# Patient Record
Sex: Female | Born: 1955 | Race: White | Hispanic: No | Marital: Married | Smoking: Current every day smoker
Health system: Southern US, Community
[De-identification: ages and names within clinical notes are randomized; demographics above are authoritative.]

## PROBLEM LIST (undated history)

## (undated) DIAGNOSIS — F3289 Other specified depressive episodes: Secondary | ICD-10-CM

## (undated) DIAGNOSIS — Z803 Family history of malignant neoplasm of breast: Secondary | ICD-10-CM

## (undated) DIAGNOSIS — M1990 Unspecified osteoarthritis, unspecified site: Secondary | ICD-10-CM

## (undated) DIAGNOSIS — J309 Allergic rhinitis, unspecified: Secondary | ICD-10-CM

## (undated) DIAGNOSIS — Z860100 Personal history of colon polyps, unspecified: Secondary | ICD-10-CM

## (undated) DIAGNOSIS — F329 Major depressive disorder, single episode, unspecified: Secondary | ICD-10-CM

## (undated) DIAGNOSIS — E785 Hyperlipidemia, unspecified: Secondary | ICD-10-CM

## (undated) DIAGNOSIS — G4733 Obstructive sleep apnea (adult) (pediatric): Secondary | ICD-10-CM

## (undated) DIAGNOSIS — F17200 Nicotine dependence, unspecified, uncomplicated: Secondary | ICD-10-CM

## (undated) DIAGNOSIS — D259 Leiomyoma of uterus, unspecified: Secondary | ICD-10-CM

## (undated) DIAGNOSIS — E119 Type 2 diabetes mellitus without complications: Secondary | ICD-10-CM

## (undated) DIAGNOSIS — E559 Vitamin D deficiency, unspecified: Secondary | ICD-10-CM

## (undated) DIAGNOSIS — M8580 Other specified disorders of bone density and structure, unspecified site: Secondary | ICD-10-CM

## (undated) HISTORY — DX: Allergic rhinitis, unspecified: J30.9

## (undated) HISTORY — DX: Hyperlipidemia, unspecified: E78.5

## (undated) HISTORY — DX: Family history of malignant neoplasm of breast: Z80.3

## (undated) HISTORY — DX: Obstructive sleep apnea (adult) (pediatric): G47.33

## (undated) HISTORY — DX: Vitamin D deficiency, unspecified: E55.9

## (undated) HISTORY — DX: Other specified depressive episodes: F32.89

## (undated) HISTORY — DX: Type 2 diabetes mellitus without complications: E11.9

## (undated) HISTORY — DX: Major depressive disorder, single episode, unspecified: F32.9

## (undated) HISTORY — DX: Personal history of colonic polyps: Z86.010

## (undated) HISTORY — DX: Leiomyoma of uterus, unspecified: D25.9

## (undated) HISTORY — DX: Personal history of colon polyps, unspecified: Z86.0100

## (undated) HISTORY — DX: Other specified disorders of bone density and structure, unspecified site: M85.80

## (undated) HISTORY — DX: Nicotine dependence, unspecified, uncomplicated: F17.200

---

## 1988-10-06 HISTORY — PX: BREAST SURGERY: SHX581

## 1999-09-11 ENCOUNTER — Ambulatory Visit (HOSPITAL_COMMUNITY): Admission: RE | Admit: 1999-09-11 | Discharge: 1999-09-11 | Payer: Self-pay | Admitting: Endocrinology

## 1999-09-11 ENCOUNTER — Encounter: Payer: Self-pay | Admitting: Endocrinology

## 1999-09-13 ENCOUNTER — Ambulatory Visit (HOSPITAL_COMMUNITY): Admission: RE | Admit: 1999-09-13 | Discharge: 1999-09-13 | Payer: Self-pay | Admitting: Endocrinology

## 1999-09-13 ENCOUNTER — Encounter: Payer: Self-pay | Admitting: Endocrinology

## 2000-04-07 ENCOUNTER — Other Ambulatory Visit: Admission: RE | Admit: 2000-04-07 | Discharge: 2000-04-07 | Payer: Self-pay | Admitting: Obstetrics and Gynecology

## 2000-04-15 ENCOUNTER — Encounter: Admission: RE | Admit: 2000-04-15 | Discharge: 2000-04-15 | Payer: Self-pay | Admitting: Obstetrics and Gynecology

## 2000-04-15 ENCOUNTER — Encounter: Payer: Self-pay | Admitting: Obstetrics and Gynecology

## 2000-09-17 ENCOUNTER — Encounter: Payer: Self-pay | Admitting: Obstetrics and Gynecology

## 2000-09-17 ENCOUNTER — Encounter: Admission: RE | Admit: 2000-09-17 | Discharge: 2000-09-17 | Payer: Self-pay | Admitting: Obstetrics and Gynecology

## 2001-04-20 ENCOUNTER — Encounter: Payer: Self-pay | Admitting: Obstetrics and Gynecology

## 2001-04-20 ENCOUNTER — Encounter: Admission: RE | Admit: 2001-04-20 | Discharge: 2001-04-20 | Payer: Self-pay | Admitting: Obstetrics and Gynecology

## 2001-09-21 ENCOUNTER — Encounter: Admission: RE | Admit: 2001-09-21 | Discharge: 2001-09-21 | Payer: Self-pay | Admitting: Obstetrics and Gynecology

## 2001-09-21 ENCOUNTER — Encounter: Payer: Self-pay | Admitting: Obstetrics and Gynecology

## 2001-10-06 DIAGNOSIS — E119 Type 2 diabetes mellitus without complications: Secondary | ICD-10-CM

## 2001-10-06 HISTORY — DX: Type 2 diabetes mellitus without complications: E11.9

## 2002-03-16 ENCOUNTER — Encounter: Payer: Self-pay | Admitting: Internal Medicine

## 2002-03-16 ENCOUNTER — Other Ambulatory Visit: Admission: RE | Admit: 2002-03-16 | Discharge: 2002-03-16 | Payer: Self-pay | Admitting: Obstetrics and Gynecology

## 2002-03-16 LAB — CONVERTED CEMR LAB: Hgb A1c MFr Bld: 5.5 %

## 2002-04-26 ENCOUNTER — Encounter: Payer: Self-pay | Admitting: Obstetrics and Gynecology

## 2002-04-26 ENCOUNTER — Encounter: Admission: RE | Admit: 2002-04-26 | Discharge: 2002-04-26 | Payer: Self-pay | Admitting: Obstetrics and Gynecology

## 2002-11-22 ENCOUNTER — Encounter: Admission: RE | Admit: 2002-11-22 | Discharge: 2002-11-22 | Payer: Self-pay | Admitting: Obstetrics and Gynecology

## 2002-11-22 ENCOUNTER — Encounter: Payer: Self-pay | Admitting: Obstetrics and Gynecology

## 2003-06-14 ENCOUNTER — Other Ambulatory Visit: Admission: RE | Admit: 2003-06-14 | Discharge: 2003-06-14 | Payer: Self-pay | Admitting: Obstetrics and Gynecology

## 2003-06-15 ENCOUNTER — Encounter: Payer: Self-pay | Admitting: Obstetrics and Gynecology

## 2003-06-15 ENCOUNTER — Encounter: Admission: RE | Admit: 2003-06-15 | Discharge: 2003-06-15 | Payer: Self-pay | Admitting: Obstetrics and Gynecology

## 2003-08-21 ENCOUNTER — Ambulatory Visit (HOSPITAL_COMMUNITY): Admission: RE | Admit: 2003-08-21 | Discharge: 2003-08-21 | Payer: Self-pay | Admitting: Obstetrics and Gynecology

## 2003-08-21 ENCOUNTER — Ambulatory Visit (HOSPITAL_BASED_OUTPATIENT_CLINIC_OR_DEPARTMENT_OTHER): Admission: RE | Admit: 2003-08-21 | Discharge: 2003-08-21 | Payer: Self-pay | Admitting: Obstetrics and Gynecology

## 2003-08-21 HISTORY — PX: ENDOMETRIAL ABLATION: SHX621

## 2003-12-11 ENCOUNTER — Encounter: Admission: RE | Admit: 2003-12-11 | Discharge: 2003-12-11 | Payer: Self-pay | Admitting: Obstetrics and Gynecology

## 2004-07-10 ENCOUNTER — Encounter: Admission: RE | Admit: 2004-07-10 | Discharge: 2004-07-10 | Payer: Self-pay | Admitting: Obstetrics and Gynecology

## 2005-03-21 ENCOUNTER — Other Ambulatory Visit: Admission: RE | Admit: 2005-03-21 | Discharge: 2005-03-21 | Payer: Self-pay | Admitting: Obstetrics and Gynecology

## 2005-06-26 ENCOUNTER — Encounter: Admission: RE | Admit: 2005-06-26 | Discharge: 2005-06-26 | Payer: Self-pay | Admitting: Obstetrics and Gynecology

## 2005-07-16 ENCOUNTER — Encounter: Admission: RE | Admit: 2005-07-16 | Discharge: 2005-07-16 | Payer: Self-pay | Admitting: Obstetrics and Gynecology

## 2005-08-27 ENCOUNTER — Encounter: Admission: RE | Admit: 2005-08-27 | Discharge: 2005-08-27 | Payer: Self-pay | Admitting: Obstetrics and Gynecology

## 2006-04-28 ENCOUNTER — Other Ambulatory Visit: Admission: RE | Admit: 2006-04-28 | Discharge: 2006-04-28 | Payer: Self-pay | Admitting: Obstetrics and Gynecology

## 2006-06-06 HISTORY — PX: COLONOSCOPY W/ BIOPSIES: SHX1374

## 2006-06-15 ENCOUNTER — Ambulatory Visit (HOSPITAL_COMMUNITY): Admission: RE | Admit: 2006-06-15 | Discharge: 2006-06-15 | Payer: Self-pay | Admitting: Gastroenterology

## 2006-06-15 ENCOUNTER — Encounter (INDEPENDENT_AMBULATORY_CARE_PROVIDER_SITE_OTHER): Payer: Self-pay | Admitting: Specialist

## 2006-06-15 HISTORY — PX: UPPER GASTROINTESTINAL ENDOSCOPY: SHX188

## 2006-06-16 ENCOUNTER — Encounter: Admission: RE | Admit: 2006-06-16 | Discharge: 2006-06-16 | Payer: Self-pay | Admitting: Gastroenterology

## 2006-06-16 ENCOUNTER — Encounter: Payer: Self-pay | Admitting: Internal Medicine

## 2006-07-06 LAB — HM COLONOSCOPY

## 2006-07-22 ENCOUNTER — Encounter: Admission: RE | Admit: 2006-07-22 | Discharge: 2006-07-22 | Payer: Self-pay | Admitting: Obstetrics and Gynecology

## 2006-07-30 ENCOUNTER — Encounter (INDEPENDENT_AMBULATORY_CARE_PROVIDER_SITE_OTHER): Payer: Self-pay | Admitting: Specialist

## 2006-07-30 ENCOUNTER — Inpatient Hospital Stay (HOSPITAL_COMMUNITY): Admission: RE | Admit: 2006-07-30 | Discharge: 2006-08-02 | Payer: Self-pay | Admitting: General Surgery

## 2006-07-30 DIAGNOSIS — Z860100 Personal history of colon polyps, unspecified: Secondary | ICD-10-CM | POA: Insufficient documentation

## 2006-07-30 HISTORY — PX: RIGHT COLECTOMY: SHX853

## 2006-08-11 ENCOUNTER — Encounter: Admission: RE | Admit: 2006-08-11 | Discharge: 2006-08-11 | Payer: Self-pay | Admitting: Obstetrics and Gynecology

## 2006-10-09 ENCOUNTER — Encounter: Payer: Self-pay | Admitting: Internal Medicine

## 2006-10-14 ENCOUNTER — Encounter: Payer: Self-pay | Admitting: Internal Medicine

## 2006-10-14 ENCOUNTER — Encounter: Admission: RE | Admit: 2006-10-14 | Discharge: 2006-10-14 | Payer: Self-pay | Admitting: Family Medicine

## 2007-06-08 ENCOUNTER — Other Ambulatory Visit: Admission: RE | Admit: 2007-06-08 | Discharge: 2007-06-08 | Payer: Self-pay | Admitting: Obstetrics and Gynecology

## 2007-08-23 ENCOUNTER — Encounter: Payer: Self-pay | Admitting: Internal Medicine

## 2007-08-23 LAB — CONVERTED CEMR LAB
Cholesterol: 154 mg/dL
HDL: 44 mg/dL
LDL Cholesterol: 86 mg/dL
Triglyceride fasting, serum: 121 mg/dL

## 2008-03-22 ENCOUNTER — Encounter: Admission: RE | Admit: 2008-03-22 | Discharge: 2008-03-22 | Payer: Self-pay | Admitting: Family Medicine

## 2008-03-22 ENCOUNTER — Encounter: Payer: Self-pay | Admitting: Internal Medicine

## 2008-03-29 ENCOUNTER — Encounter: Payer: Self-pay | Admitting: Internal Medicine

## 2008-03-29 ENCOUNTER — Encounter: Admission: RE | Admit: 2008-03-29 | Discharge: 2008-03-29 | Payer: Self-pay | Admitting: Family Medicine

## 2008-07-25 ENCOUNTER — Other Ambulatory Visit: Admission: RE | Admit: 2008-07-25 | Discharge: 2008-07-25 | Payer: Self-pay | Admitting: Obstetrics and Gynecology

## 2008-07-25 ENCOUNTER — Encounter: Payer: Self-pay | Admitting: Internal Medicine

## 2008-07-25 LAB — CONVERTED CEMR LAB: TSH: 1.073 microintl units/mL

## 2008-08-03 ENCOUNTER — Encounter: Payer: Self-pay | Admitting: Internal Medicine

## 2008-08-03 LAB — CONVERTED CEMR LAB
ALT: 31 units/L
AST: 24 units/L
Alkaline Phosphatase: 64 units/L
Total Bilirubin: 0.5 mg/dL
Total Protein: 7.7 g/dL

## 2008-09-15 ENCOUNTER — Encounter: Payer: Self-pay | Admitting: Internal Medicine

## 2008-09-15 LAB — CONVERTED CEMR LAB
ALT: 32 units/L
AST: 30 units/L
Albumin: 4.7 g/dL
Alkaline Phosphatase: 82 units/L
BUN: 13 mg/dL
CO2: 24 meq/L
Chloride: 104 meq/L
Cholesterol: 164 mg/dL
Creatinine, Ser: 0.75 mg/dL
Glucose, Bld: 96 mg/dL
HDL: 48 mg/dL
LDL Cholesterol: 90 mg/dL
Potassium: 4.4 meq/L
Sodium: 139 meq/L
Total Bilirubin: 0.3 mg/dL
Total Protein: 7.7 g/dL
Triglyceride fasting, serum: 130 mg/dL

## 2008-10-06 LAB — CONVERTED CEMR LAB: Pap Smear: NEGATIVE

## 2008-11-01 ENCOUNTER — Encounter: Admission: RE | Admit: 2008-11-01 | Discharge: 2008-11-01 | Payer: Self-pay | Admitting: Obstetrics and Gynecology

## 2008-11-13 HISTORY — PX: COLONOSCOPY W/ BIOPSIES: SHX1374

## 2009-04-24 ENCOUNTER — Encounter: Payer: Self-pay | Admitting: Internal Medicine

## 2009-04-24 LAB — CONVERTED CEMR LAB
ALT: 27 units/L
AST: 24 units/L
Alkaline Phosphatase: 70 units/L
BUN: 17 mg/dL
Basophils Relative: 0 %
CO2: 23 meq/L
Calcium: 9.3 mg/dL
Chloride: 106 meq/L
Cholesterol: 160 mg/dL
Creatinine, Ser: 0.76 mg/dL
Eosinophils Relative: 2 %
Glucose, Bld: 96 mg/dL
HCT: 42.6 %
HDL: 47 mg/dL
Hemoglobin: 14.2 g/dL
LDL Cholesterol: 90 mg/dL
Lymphocytes, automated: 36 %
MCV: 95 fL
Monocytes Relative: 9 %
Neutrophils Relative %: 53 %
Platelets: 258 10*3/uL
Potassium: 4.4 meq/L
RBC: 4.48 M/uL
RDW: 12.5 %
Sodium: 145 meq/L
Total Bilirubin: 0.4 mg/dL
Total Protein: 7.3 g/dL
Triglyceride fasting, serum: 113 mg/dL
WBC: 6.3 10*3/uL

## 2009-05-22 ENCOUNTER — Encounter: Admission: RE | Admit: 2009-05-22 | Discharge: 2009-05-22 | Payer: Self-pay | Admitting: Obstetrics and Gynecology

## 2010-01-21 ENCOUNTER — Ambulatory Visit: Payer: Self-pay | Admitting: Internal Medicine

## 2010-01-21 DIAGNOSIS — J309 Allergic rhinitis, unspecified: Secondary | ICD-10-CM | POA: Insufficient documentation

## 2010-01-21 DIAGNOSIS — E785 Hyperlipidemia, unspecified: Secondary | ICD-10-CM | POA: Insufficient documentation

## 2010-01-21 DIAGNOSIS — F32A Depression, unspecified: Secondary | ICD-10-CM | POA: Insufficient documentation

## 2010-01-21 DIAGNOSIS — F17200 Nicotine dependence, unspecified, uncomplicated: Secondary | ICD-10-CM

## 2010-01-21 DIAGNOSIS — F17210 Nicotine dependence, cigarettes, uncomplicated: Secondary | ICD-10-CM | POA: Insufficient documentation

## 2010-01-21 DIAGNOSIS — F329 Major depressive disorder, single episode, unspecified: Secondary | ICD-10-CM

## 2010-01-21 DIAGNOSIS — Z720 Tobacco use: Secondary | ICD-10-CM | POA: Insufficient documentation

## 2010-01-21 DIAGNOSIS — N951 Menopausal and female climacteric states: Secondary | ICD-10-CM | POA: Insufficient documentation

## 2010-01-21 DIAGNOSIS — R635 Abnormal weight gain: Secondary | ICD-10-CM | POA: Insufficient documentation

## 2010-01-28 ENCOUNTER — Ambulatory Visit: Payer: Self-pay | Admitting: Internal Medicine

## 2010-01-28 DIAGNOSIS — J329 Chronic sinusitis, unspecified: Secondary | ICD-10-CM | POA: Insufficient documentation

## 2010-01-29 ENCOUNTER — Encounter: Payer: Self-pay | Admitting: Internal Medicine

## 2010-01-29 DIAGNOSIS — E559 Vitamin D deficiency, unspecified: Secondary | ICD-10-CM | POA: Insufficient documentation

## 2010-02-18 ENCOUNTER — Telehealth: Payer: Self-pay | Admitting: Internal Medicine

## 2010-02-20 ENCOUNTER — Encounter: Payer: Self-pay | Admitting: Internal Medicine

## 2010-03-28 ENCOUNTER — Ambulatory Visit: Payer: Self-pay | Admitting: Internal Medicine

## 2010-03-28 LAB — CONVERTED CEMR LAB
ALT: 29 units/L (ref 0–35)
AST: 25 units/L (ref 0–37)
Albumin: 4.2 g/dL (ref 3.5–5.2)
Alkaline Phosphatase: 55 units/L (ref 39–117)
BUN: 15 mg/dL (ref 6–23)
Basophils Absolute: 0 10*3/uL (ref 0.0–0.1)
Basophils Relative: 0.4 % (ref 0.0–3.0)
Bilirubin Urine: NEGATIVE
Bilirubin, Direct: 0.1 mg/dL (ref 0.0–0.3)
CO2: 27 meq/L (ref 19–32)
Calcium: 9 mg/dL (ref 8.4–10.5)
Chloride: 109 meq/L (ref 96–112)
Cholesterol: 162 mg/dL (ref 0–200)
Creatinine, Ser: 0.5 mg/dL (ref 0.4–1.2)
Eosinophils Absolute: 0.1 10*3/uL (ref 0.0–0.7)
Eosinophils Relative: 1.8 % (ref 0.0–5.0)
GFR calc non Af Amer: 130.05 mL/min (ref >60)
Glucose, Bld: 105 mg/dL — ABNORMAL HIGH (ref 70–99)
HCT: 40 % (ref 36.0–46.0)
HDL: 44.4 mg/dL (ref >39.00)
Hemoglobin: 13.9 g/dL (ref 12.0–15.0)
Ketones, ur: NEGATIVE mg/dL
LDL Cholesterol: 101 mg/dL — ABNORMAL HIGH (ref 0–99)
Leukocytes, UA: NEGATIVE
Lymphocytes Relative: 36.4 % (ref 12.0–46.0)
Lymphs Abs: 2.4 10*3/uL (ref 0.7–4.0)
MCHC: 34.8 g/dL (ref 30.0–36.0)
MCV: 94.8 fL (ref 78.0–100.0)
Monocytes Absolute: 0.7 10*3/uL (ref 0.1–1.0)
Monocytes Relative: 10.3 % (ref 3.0–12.0)
Neutro Abs: 3.4 10*3/uL (ref 1.4–7.7)
Neutrophils Relative %: 51.1 % (ref 43.0–77.0)
Nitrite: NEGATIVE
Platelets: 220 10*3/uL (ref 150.0–400.0)
Potassium: 4.3 meq/L (ref 3.5–5.1)
RBC: 4.23 M/uL (ref 3.87–5.11)
RDW: 12.2 % (ref 11.5–14.6)
Sodium: 142 meq/L (ref 135–145)
Specific Gravity, Urine: >=1.03 (ref 1.000–1.030)
TSH: 1 microintl units/mL (ref 0.35–5.50)
Total Bilirubin: 0.6 mg/dL (ref 0.3–1.2)
Total CHOL/HDL Ratio: 4
Total Protein, Urine: NEGATIVE mg/dL
Total Protein: 7.3 g/dL (ref 6.0–8.3)
Triglycerides: 83 mg/dL (ref 0.0–149.0)
Urine Glucose: NEGATIVE mg/dL
Urobilinogen, UA: 0.2 (ref 0.0–1.0)
VLDL: 16.6 mg/dL (ref 0.0–40.0)
WBC: 6.7 10*3/uL (ref 4.5–10.5)
pH: 5 (ref 5.0–8.0)

## 2010-04-02 ENCOUNTER — Ambulatory Visit: Payer: Self-pay | Admitting: Internal Medicine

## 2010-08-07 ENCOUNTER — Telehealth: Payer: Self-pay | Admitting: Internal Medicine

## 2010-09-13 ENCOUNTER — Encounter
Admission: RE | Admit: 2010-09-13 | Discharge: 2010-09-13 | Payer: Self-pay | Source: Home / Self Care | Attending: Obstetrics and Gynecology | Admitting: Obstetrics and Gynecology

## 2010-09-13 LAB — HM MAMMOGRAPHY: HM Mammogram: NEGATIVE

## 2010-10-27 ENCOUNTER — Encounter: Payer: Self-pay | Admitting: Obstetrics and Gynecology

## 2010-10-27 ENCOUNTER — Encounter: Payer: Self-pay | Admitting: Gastroenterology

## 2010-11-05 NOTE — Assessment & Plan Note (Signed)
Summary: new / bcbs / # / cd   Vital Signs:  Patient profile:   55 year old female Height:      63 inches (160.02 cm) Weight:      148.6 pounds (67.55 kg) BMI:     26.42 O2 Sat:      95 % on Room air Temp:     98.4 degrees F (36.89 degrees C) oral Pulse rate:   70 / minute BP sitting:   112 / 72  (left arm) Cuff size:   regular  Vitals Entered By: Orlan Leavens (January 21, 2010 2:17 PM)  O2 Flow:  Room air CC: New patient Is Patient Diabetic? No Pain Assessment Patient in pain? no        Primary Care Provider:  Newt Lukes MD  CC:  New patient.  History of Present Illness: new pt to me, here to est care - prev followed at [facility] - dr. stone  1) dyslipidemia - on statin tx for years w/o adv SE but ran out of meds 1.5 week ago- denies GI or Mskel probs on meds - due for FLP and lab check but not fasting now  2) depression - stress induced - on lexapro for years with good tol of med - no adv Se and symptoms well controlled -  3) tobacco abuse - has desire to quit but unable to do so - smoking habit a/w "winddown" relaxation at end of the day - denies SOB or cough - no pleurisy or CP  4) weight gain - inc >25# last 2 years - working on diet changes - tyrin to work in dedicated exercise time  [employer]  Alcohol-Tobacco     Alcohol drinks/day: <1     Alcohol Counseling: not indicated; use of alcohol is not excessive or problematic     Smoking Status: current     Tobacco Counseling: to quit use of tobacco products  Caffeine-Diet-Exercise     Diet Counseling: not indicated; diet is assessed to be healthy     Does Patient Exercise: no     Exercise Counseling: to improve exercise regimen     Depression Counseling: not indicated; screening negative for depression  Safety-Violence-Falls     Seat Belt Use: yes     Helmet Use: yes     Firearms in the Home: no firearms in the home     Smoke Detectors: yes     Violence in the  Home: no risk noted     Fall Risk Counseling: not indicated; no significant falls noted  Clinical Review Panels:  Prevention   Last Mammogram:  [facility].  Impression; No evidence of malignancy is identified in either breast. A stable asymmetry in the medial left breast has previously been shown to be a cluster of cyst on u/s.   BI-RADS 2, Benign findings (05/22/2009)   Last Pap Smear:  Interpretation/Result:Negative for intraepithelial Lesion or Malignancy.    (10/06/2008)   Last Colonoscopy:  Done by Dr. Loreta Ave Results: Showed some lesion of right colon, underwent colectomy (07/06/2006)  Immunizations   Last Tetanus Booster:  Historical (10/06/2002)   Current Medications (verified): 1)  Zyrtec Hives Relief 10 Mg Tabs (Cetirizine Hcl) .... Take 1 By Mouth Qd 2)  Vitamin D3 1000 Unit Caps (Cholecalciferol) .... Take 2 Po Qd 3)  Lexapro 10 Mg Tabs (Escitalopram Oxalate) .... Take 1 By Mouth Qd 4)  Vagifem 10 Mcg Tabs (Estradiol) .... Take 1  Weekly 5)  Caltrate 600+d 600-400 Mg-Unit Tabs (Calcium Carbonate-Vitamin D) .... Take 1 By Mouth Qd 6)  Aspirin 81 Mg Tbec (Aspirin) .... Once Daily 7)  Simvastatin 20 Mg Tabs (Simvastatin) .... Take 1 By Mouth Qd  Allergies (verified): No Known Drug Allergies  Past History:  Past Medical History: Allergic rhinitis Colonic polyps, hx of Hyperlipidemia  MD rooster: GI -[place] gyn-romine  Past Surgical History: Colectomy, Right (2008) Breast biopsy (1992)  Family History: Family History Breast cancer 1st degree relative <50 (mom, sister) Family History Diabetes 1st degree relative (mom) Family History High cholesterol (parent) Family History Hypertension (parent0 Stroke (momO Heart disease (dad)  Social History: Current Smoker married, lives with spouse - - cares for elderly parents and in-law    Smoking Status:  current Does Patient Exercise:  no Seat Belt Use:  yes  Review of  Systems       see HPI above. I have reviewed all other systems and they were negative.   Physical Exam  General:  alert, well-developed, well-nourished, and cooperative to examination.    Eyes:  vision grossly intact; pupils equal, round and reactive to light.  conjunctiva and lids normal.    Ears:  normal pinnae bilaterally, without erythema, swelling, or tenderness to palpation. TMs clear, without effusion, or cerumen impaction. Hearing grossly normal bilaterally  Mouth:  teeth and gums in good repair; mucous membranes moist, without lesions or ulcers. oropharynx clear without exudate, no erythema.  Neck:  supple, full ROM, no masses, no thyromegaly; no thyroid nodules or tenderness. no JVD or carotid bruits.   Lungs:  normal respiratory effort, no intercostal retractions or use of accessory muscles; normal breath sounds bilaterally - no crackles and no wheezes.    Heart:  normal rate, regular rhythm, no murmur, and no rub. BLE without edema. normal DP pulses and normal cap refill in all 4 extremities    Abdomen:  soft, non-tender, normal bowel sounds, no distention; no masses and no appreciable hepatomegaly or splenomegaly.   Msk:  No deformity or scoliosis noted of thoracic or lumbar spine.   Neurologic:  alert & oriented X3 and cranial nerves II-XII symetrically intact.  strength normal in all extremities, sensation intact to light touch, and gait normal. speech fluent without dysarthria or aphasia; follows commands with good comprehension.  Skin:  no rashes, vesicles, ulcers, or erythema. No nodules or irregularity to palpation.  Psych:  Oriented X3, memory intact for recent and remote, normally interactive, good eye contact, not anxious appearing, not depressed appearing, and not agitated.      Impression & Recommendations:  Problem # 1:  HYPERLIPIDEMIA (ICD-272.4)  cont tx until CPX labs to monitor effect of current dosing - new e-rx done Her updated medication list for this problem  includes:    Simvastatin 20 Mg Tabs (Simvastatin) .[name]... Take 1 by mouth at bedtime  Orders: Tobacco use cessation intermediate 3-10 minutes ([address])  Problem # 2:  DEPRESSION (ICD-311)  symptoms well controlled - cont same Her updated medication list for this problem includes:    Lexapro 10 Mg Tabs (Escitalopram oxalate) .[name]... Take 1 by mouth once daily  Orders: Tobacco use cessation intermediate 3-10 minutes ([address])  Problem # 3:  SMOKER (ICD-305.1)  5 minutes today spent on patient education regarding the unhealthy effects of continued tobacco abuse and encouragment of cessation including medical options available to help patient to quit smoking.  Her updated medication list for this problem includes:    Chantix Starting Month  Pak 0.5 Mg X 11 & 1 Mg X 42 Tabs (Varenicline tartrate) .[name]... As directed  Orders: Tobacco use cessation intermediate 3-10 minutes ([address]) Prescription Created Electronically [phone])  Problem # 4:  WEIGHT GAIN (ICD-783.1)  counseling and education reviewed on heart healthy diet, recs for regular physical aerobic exercise  check labs at upcoming CPX  Orders: Tobacco use cessation intermediate 3-10 minutes ([address])  Problem # 5:  ALLERGIC RHINITIS (ICD-477.9)  Her updated medication list for this problem includes:    Zyrtec Hives Relief 10 Mg Tabs (Cetirizine hcl) .[name]... Take 1 by mouth once daily  Discussed use of allergy medications and environmental measures.   Complete Medication List: 1)  Zyrtec Hives Relief 10 Mg Tabs (Cetirizine hcl) .... Take 1 by mouth once daily 2)  Vitamin D3 1000 Unit Caps (Cholecalciferol) .... Take 2 po once daily 3)  Lexapro 10 Mg Tabs (Escitalopram oxalate) .... Take 1 by mouth once daily 4)  Vagifem 10 Mcg Tabs (Estradiol) .... Take 1 weekly 5)  Caltrate 600+d 600-400 Mg-unit Tabs (Calcium carbonate-vitamin d) .... Take 1 by mouth once daily 6)  Aspirin 81 Mg Tbec (Aspirin) .... Once daily 7)  Simvastatin 20 Mg Tabs  (Simvastatin) .... Take 1 by mouth at bedtime 8)  Chantix Starting Month Pak 0.5 Mg X 11 & 1 Mg X 42 Tabs (Varenicline tartrate) .... As directed  Patient Instructions: 1)  it was good to see you today.  2)  refills on your simvastatin provided today -  3)  begin Chantix to help quit smoking - information provided 4)  your prescriptions have been electronically submitted to your pharmacy. Please take as directed. Contact our office if you believe you're having problems with the medication(s).  5)  schedule for a medical physical in next 6-12 weeks - can do fasting labs before the visit if this is easier for you - EKG will be done at that visit - Prescriptions: CHANTIX STARTING MONTH PAK 0.5 MG X 11 & 1 MG X 42 TABS (VARENICLINE TARTRATE) as directed  #1 x 0   Entered and Authorized by:   Newt Lukes MD   Signed by:   Newt Lukes MD on 01/21/2010   Method used:   Electronically to        [id number] (retail)       [address]       [place], [place]  [address]       Ph: [phone]       Fax: [phone]   RxID:   [id number] SIMVASTATIN 20 MG TABS (SIMVASTATIN) take 1 by mouth at bedtime  #30 x 3   Entered and Authorized by:   Newt Lukes MD   Signed by:   Newt Lukes MD on 01/21/2010   Method used:   Electronically to        [id number] (retail)       [address]       [place], [place]  [address]       Ph: [phone]       Fax: [phone]   RxID:   [id number]    Pap Smear  Procedure date:  10/06/2008  Findings:      Interpretation/Result:Negative for intraepithelial Lesion or Malignancy.       Immunization History:  Tetanus/Td Immunization History:    Tetanus/Td:  historical (10/06/2002)

## 2010-11-05 NOTE — Consult Note (Signed)
Summary: [facility]  [facility]   Imported By: Sherian Rein 02/27/2010 14:28:15  _____________________________________________________________________  External Attachment:    Type:   Image     Comment:   External Document

## 2010-11-05 NOTE — Progress Notes (Signed)
Summary: azelast nasal spray  Phone Note Refill Request Message from:  Fax from Pharmacy on August 07, 2010 9:11 AM  Refills Requested: Medication #1:  AZELASTINE HCL 137 MCG/SPRAY SOLN usr 1 spray each nostril once daily as needed.   Last Refilled: 05/01/2010  Method Requested: Electronic Initial call taken by: Orlan Leavens RMA,  August 07, 2010 9:11 AM    Prescriptions: AZELASTINE HCL 137 MCG/SPRAY SOLN (AZELASTINE HCL) usr 1 spray each nostril once daily as needed  #30 x 1   Entered by:   Orlan Leavens RMA   Authorized by:   Newt Lukes MD   Signed by:   Orlan Leavens RMA on 08/07/2010   Method used:   Electronically to        [id number] (retail)       [address]       [place], [place]  [address]       Ph: [phone]       Fax: [phone]   RxID:   [id number]

## 2010-11-05 NOTE — Progress Notes (Signed)
Summary: Referral  Phone Note Call from Patient Call back at Home Phone [phone]   Caller: Patient Summary of Call: pt called stating that her Sinus Infection did not get better and now she "totally can't hear out of one ear". Pt is requesting referral to ENT Initial call taken by: Margaret Pyle, CMA,  Feb 18, 2010 10:30 AM  Follow-up for Phone Call        ENT order done - Follow-up by: Newt Lukes MD,  Feb 18, 2010 10:34 AM  Additional Follow-up for Phone Call Additional follow up Details #1::        pt informed via personal VM. told to call back with any questions or concerns Additional Follow-up by: Margaret Pyle, CMA,  Feb 18, 2010 10:46 AM

## 2010-11-05 NOTE — Assessment & Plan Note (Signed)
Summary: SINUS INFECTIO/NWS   Vital Signs:  Patient profile:   55 year old female Height:      63 inches (160.02 cm) Weight:      148 pounds (67.27 kg) O2 Sat:      97 % on Room air Temp:     99.4 degrees F (37.44 degrees C) oral Pulse rate:   82 / minute BP sitting:   110 / 76  (left arm) Cuff size:   regular  Vitals Entered By: Orlan Leavens (January 28, 2010 3:58 PM)  O2 Flow:  Room air CC: Sinus infection, URI symptoms Is Patient Diabetic? No Pain Assessment Patient in pain? no        Primary Care Provider:  Newt Lukes MD  CC:  Sinus infection and URI symptoms.  History of Present Illness:  URI Symptoms      This is a 55 year old woman who presents with URI symptoms.  The symptoms began 4 days ago.  The severity is described as moderate.  The patient reports nasal congestion, purulent nasal discharge, and productive cough, but denies sore throat, earache, and sick contacts.  Associated symptoms include low-grade fever (<100.5 degrees).  The patient denies dyspnea, wheezing, rash, vomiting, and diarrhea.  The patient also reports headache and severe fatigue.  The patient denies seasonal symptoms and response to antihistamine.  The patient denies the following risk factors for Strep sinusitis: tooth pain, Strep exposure, and tender adenopathy.    Current Medications (verified): 1)  Zyrtec Hives Relief 10 Mg Tabs (Cetirizine Hcl) .... Take 1 By Mouth Once Daily 2)  Vitamin D3 1000 Unit Caps (Cholecalciferol) .... Take 2 Po Once Daily 3)  Lexapro 10 Mg Tabs (Escitalopram Oxalate) .... Take 1 By Mouth Once Daily 4)  Vagifem 10 Mcg Tabs (Estradiol) .... Take 1 Weekly 5)  Caltrate 600+d 600-400 Mg-Unit Tabs (Calcium Carbonate-Vitamin D) .... Take 1 By Mouth Once Daily 6)  Aspirin 81 Mg Tbec (Aspirin) .... Once Daily 7)  Simvastatin 20 Mg Tabs (Simvastatin) .... Take 1 By Mouth At Bedtime 8)  Chantix Starting Month Pak 0.5 Mg X 11 & 1 Mg X 42 Tabs (Varenicline Tartrate) ....  As Directed  Allergies (verified): No Known Drug Allergies  Past History:  Past Medical History: Allergic rhinitis Colonic polyps, hx of Hyperlipidemia    MD rooster: GI -Jech gyn-romine  Review of Systems  The patient denies anorexia, dyspnea on exertion, peripheral edema, hemoptysis, and abdominal pain.    Physical Exam  General:  alert, well-developed, well-nourished, and cooperative to examination.   mild-mod ill appearing Eyes:  vision grossly intact; pupils equal, round and reactive to light.  conjunctiva and lids normal.    Ears:  normal pinnae bilaterally, without erythema, swelling, or tenderness to palpation. TMs clear, without effusion, or cerumen impaction. Hearing grossly normal bilaterally  Mouth:  teeth and gums in good repair; mucous membranes moist, without lesions or ulcers. oropharynx clear without exudate, mod erythema. +yellow-green PND Lungs:  normal respiratory effort, no intercostal retractions or use of accessory muscles; normal breath sounds bilaterally - no crackles and no wheezes.    Heart:  normal rate, regular rhythm, no murmur, and no rub. BLE without edema.   Impression & Recommendations:  Problem # 1:  SINUSITIS (ICD-473.9)  add abx, cont symptoms OTC med mgmt and reminded of importance of smoking cessation Her updated medication list for this problem includes:    Levaquin 500 Mg Tabs (Levofloxacin) .[name]... 1 by mouth once daily  x7days  Take antibiotics for full duration. Discussed treatment options including indications for coronal CT scan of sinuses and ENT referral.   Orders: Prescription Created Electronically [phone])  Complete Medication List: 1)  Zyrtec Hives Relief 10 Mg Tabs (Cetirizine hcl) .... Take 1 by mouth once daily 2)  Vitamin D3 1000 Unit Caps (Cholecalciferol) .... Take 2 po once daily 3)  Lexapro 10 Mg Tabs (Escitalopram oxalate) .... Take 1 by mouth once daily 4)  Vagifem 10 Mcg Tabs (Estradiol) .... Take 1 weekly 5)   Caltrate 600+d 600-400 Mg-unit Tabs (Calcium carbonate-vitamin d) .... Take 1 by mouth once daily 6)  Aspirin 81 Mg Tbec (Aspirin) .... Once daily 7)  Simvastatin 20 Mg Tabs (Simvastatin) .... Take 1 by mouth at bedtime 8)  Chantix Starting Month Pak 0.5 Mg X 11 & 1 Mg X 42 Tabs (Varenicline tartrate) .... As directed 9)  Levaquin 500 Mg Tabs (Levofloxacin) .[name].. 1 by mouth once daily x7days  Patient Instructions: 1)  it was good to see you today. 2)  Levaquin x 7 days for your sinus infection - your prescriptions have been electronically submitted to your pharmacy. Please take as directed. Contact our office if you believe you're having problems with the medication(s). 3)  continue the Neti pot, benadryl and antihistamines as needed as ongoing 4)  Get plenty of rest, drink lots of clear liquids, and use Tylenol or Ibuprofen for fever and comfort. Return in 7-10 days if you're not better:sooner if you're feeling worse. Prescriptions: LEVAQUIN 500 MG TABS (LEVOFLOXACIN) 1 by mouth once daily x7days  #7 x 0   Entered and Authorized by:   Newt Lukes MD   Signed by:   Newt Lukes MD on 01/28/2010   Method used:   Electronically to        [facility] [address] (retail)       [address]       [place], [place]  [address]       Ph: [phone]       Fax: [phone]   RxID:   [id number]

## 2010-11-05 NOTE — Assessment & Plan Note (Signed)
Summary: cpx / # / cd   Vital Signs:  Patient profile:   55 year old female Height:      63 inches (160.02 cm) Weight:      146.8 pounds (66.73 kg) O2 Sat:      96 % on Room air Temp:     98.4 degrees F (36.89 degrees C) oral Pulse rate:   76 / minute BP sitting:   102 / 72  (left arm) Cuff size:   regular  Vitals Entered By: Orlan Leavens (April 02, 2010 9:40 AM)  O2 Flow:  Room air CC: CPX Is Patient Diabetic? No Pain Assessment Patient in pain? no        Primary Care Provider:  Newt Lukes MD  CC:  CPX.  History of Present Illness: patient is here today for annual physical. Patient feels well and has no complaints.   Preventive Screening-Counseling & Management  Alcohol-Tobacco     Alcohol drinks/day: <1     Alcohol Counseling: not indicated; use of alcohol is not excessive or problematic     Smoking Status: current     Tobacco Counseling: to quit use of tobacco products  Caffeine-Diet-Exercise     Diet Counseling: not indicated; diet is assessed to be healthy     Does Patient Exercise: no     Exercise Counseling: to improve exercise regimen     Depression Counseling: not indicated; screening negative for depression  Safety-Violence-Falls     Seat Belt Use: yes     Helmet Use: yes     Firearms in the Home: no firearms in the home     Smoke Detectors: yes     Violence in the Home: no risk noted     Fall Risk Counseling: not indicated; no significant falls noted  Clinical Review Panels:  Prevention   Last Mammogram:  Location: [facility].  Impression; No evidence of malignancy is identified in either breast. A stable asymmetry in the medial left breast has previously been shown to be a cluster of cyst on u/s.   BI-RADS 2, Benign findings (05/22/2009)   Last Pap Smear:  Interpretation/Result:Negative for intraepithelial Lesion or Malignancy.    (10/06/2008)   Last Colonoscopy:  Done by Dr. Loreta Ave Results: Showed some lesion of  right colon, underwent colectomy (07/06/2006)  Immunizations   Last Tetanus Booster:  Historical (10/06/2002)  Lipid Management   Cholesterol:  162 (03/28/2010)   LDL (bad choesterol):  101 (03/28/2010)   HDL (good cholesterol):  44.40 (03/28/2010)   Triglycerides:  113 (04/24/2009)  CBC   WBC:  6.7 (03/28/2010)   RBC:  4.23 (03/28/2010)   Hgb:  13.9 (03/28/2010)   Hct:  40.0 (03/28/2010)   Platelets:  220.0 (03/28/2010)   MCV  94.8 (03/28/2010)   MCHC  34.8 (03/28/2010)   RDW  12.2 (03/28/2010)   PMN:  51.1 (03/28/2010)   Lymphs:  36.4 (03/28/2010)   Monos:  10.3 (03/28/2010)   Eosinophils:  1.8 (03/28/2010)   Basophil:  0.4 (03/28/2010)  Complete Metabolic Panel   Glucose:  105 (03/28/2010)   Sodium:  142 (03/28/2010)   Potassium:  4.3 (03/28/2010)   Chloride:  109 (03/28/2010)   CO2:  27 (03/28/2010)   BUN:  15 (03/28/2010)   Creatinine:  0.5 (03/28/2010)   Albumin:  4.2 (03/28/2010)   Total Protein:  7.3 (03/28/2010)   Calcium:  9.0 (03/28/2010)   Total Bili:  0.6 (03/28/2010)   Alk Phos:  55 (03/28/2010)  SGPT (ALT):  29 (03/28/2010)   SGOT (AST):  25 (03/28/2010)   Current Medications (verified): 1)  Zyrtec Hives Relief 10 Mg Tabs (Cetirizine Hcl) .... Take 1 By Mouth Once Daily 2)  Vitamin D3 1000 Unit Caps (Cholecalciferol) .... Take 2 Po Once Daily 3)  Lexapro 10 Mg Tabs (Escitalopram Oxalate) .... Take 1 By Mouth Once Daily 4)  Vagifem 10 Mcg Tabs (Estradiol) .... Take 1 Weekly 5)  Caltrate 600+d 600-400 Mg-Unit Tabs (Calcium Carbonate-Vitamin D) .... Take 1 By Mouth Once Daily 6)  Aspirin 81 Mg Tbec (Aspirin) .... Once Daily 7)  Simvastatin 20 Mg Tabs (Simvastatin) .... Take 1 By Mouth At Bedtime 8)  Azelastine Hcl 137 Mcg/spray Soln (Azelastine Hcl) .... Usr 1 Spray Each Nostril Once Daily As Needed  Allergies (verified): No Known Drug Allergies  Past History:  Past Medical History: Allergic rhinitis Colonic polyps, hx  of Hyperlipidemia depression    MD roster: GI -[place] gyn-romine  Review of Systems       see HPI above. I have reviewed all other systems and they were negative.   Physical Exam  General:  alert, well-developed, well-nourished, and cooperative to examination.  Eyes:  vision grossly intact; pupils equal, round and reactive to light.  conjunctiva and lids normal.    Ears:  normal pinnae bilaterally, without erythema, swelling, or tenderness to palpation. TMs clear, without effusion, or cerumen impaction. Hearing grossly normal bilaterally  Mouth:  teeth and gums in good repair; mucous membranes moist, without lesions or ulcers. oropharynx clear without exudate, no erythema.  Lungs:  normal respiratory effort, no intercostal retractions or use of accessory muscles; normal breath sounds bilaterally - no crackles and no wheezes.    Heart:  normal rate, regular rhythm, no murmur, and no rub. BLE without edema. normal DP pulses and normal cap refill in all 4 extremities    Abdomen:  soft, non-tender, normal bowel sounds, no distention; no masses and no appreciable hepatomegaly or splenomegaly.   Genitalia:  defer to gyn Msk:  No deformity or scoliosis noted of thoracic or lumbar spine.   Neurologic:  alert & oriented X3 and cranial nerves II-XII symetrically intact.  strength normal in all extremities, sensation intact to light touch, and gait normal. speech fluent without dysarthria or aphasia; follows commands with good comprehension.  Skin:  no rashes, vesicles, ulcers, or erythema. No nodules or irregularity to palpation.  Psych:  Oriented X3, memory intact for recent and remote, normally interactive, good eye contact, not anxious appearing, not depressed appearing, and not agitated.      Impression & Recommendations:  Problem # 1:  PREVENTIVE HEALTH CARE (ICD-V70.0)  Patient has been counseled on age-appropriate routine health concerns for screening and prevention. These are reviewed and  up-to-date. Immunizations are up-to-date or declined. Labs and ECG reviewed.   Orders: EKG w/ Interpretation (93[address])  Complete Medication List: 1)  Zyrtec Hives Relief 10 Mg Tabs (Cetirizine hcl) .... Take 1 by mouth once daily 2)  Vitamin D3 1000 Unit Caps (Cholecalciferol) .... Take 2 po once daily 3)  Lexapro 10 Mg Tabs (Escitalopram oxalate) .... Take 1 by mouth once daily 4)  Vagifem 10 Mcg Tabs (Estradiol) .... Take 1 weekly 5)  Caltrate 600+d 600-400 Mg-unit Tabs (Calcium carbonate-vitamin d) .... Take 1 by mouth once daily 6)  Aspirin 81 Mg Tbec (Aspirin) .... Once daily 7)  Simvastatin 20 Mg Tabs (Simvastatin) .... Take 1 by mouth at bedtime 8)  Azelastine Hcl 137 Mcg/spray Soln (  Azelastine hcl) .... Usr 1 spray each nostril once daily as needed  Patient Instructions: 1)  it was good to see you today.  2)  labs, exam and EKG look great - no changes recommended - 3)  stop smoking.... you can do it! 4)  continue to follow for your regular PAP/pelvic, mammo and colon when due 5)  Please schedule a follow-up appointment for your annual medical physical next summer, call sooner if problems.  Prescriptions: SIMVASTATIN 20 MG TABS (SIMVASTATIN) take 1 by mouth at bedtime  #30 x 11   Entered and Authorized by:   Newt Lukes MD   Signed by:   Newt Lukes MD on 04/02/2010   Method used:   Electronically to        [facility] [address] (retail)       [address]       [place], [place]  [address]       Ph: [phone]       Fax: [phone]   RxID:   [id number]

## 2011-02-21 NOTE — Op Note (Signed)
NAME:  Christina Buchanan, Christina Buchanan                ACCOUNT NO.:  [id number]   MEDICAL RECORD NO.:  [id number]          PATIENT TYPE:  AMB   LOCATION:  ENDO                         FACILITY:  [facility]   PHYSICIAN:  Anselmo Rod, M.D.  DATE OF BIRTH:  11/20/1955   DATE OF PROCEDURE:  06/15/2006  DATE OF DISCHARGE:                                 OPERATIVE REPORT   PROCEDURE:  Esophagogastroduodenoscopy with multiple cold biopsies.   ENDOSCOPIST:  Anselmo Rod, M.D.   INSTRUMENT USED:  Olympus video panendoscope.   INDICATIONS FOR PROCEDURE:  A 55 year old white female with a history of  changing in bowel habits, diarrhea, rule out sprue.   PREPROCEDURE PREPARATION:  Informed consent was procured from the patient.  The patient was fasted for eight hours prior to the procedure.  The risks  and benefits of the procedure were discussed with her in great detail.   PREPROCEDURE PHYSICAL:  VITAL SIGNS:  The patient had stable vital signs.  NECK:  Supple.  CHEST:  Clear to auscultation.  CARDIAC:  S1 and S2 regular.  ABDOMEN:  Soft with normal bowel sounds.   DESCRIPTION OF PROCEDURE:  The patient was placed in the left lateral  decubitus position and sedated with 75 mcg of fentanyl and 7.5 mg of Versed  in incremental doses. Once the patient was adequately sedated, maintained on  low-flow oxygen and continuous cardiac monitoring, the Olympus video  panendoscope was advance through the mouth piece over the tongue, into the  esophagus under direct vision. Two small sessile polyps were biopsied from  the proximal stomach.  The rest of the gastric mucosa appeared healthy.  Retroflexion in the high cardia revealed no abnormalities.  Small bowel  biopsies were done to rule out sprue. The patient tolerated the procedure  well without immediate complications.   IMPRESSION:  1. Normal appearing esophagus with no evidence of ring, stricture, mass,      esophagitis, or Barrett's mucosa.  2. Two small  sessile polyps biopsied from the proximal stomach.  3. Otherwise normal-appearing gastric mucosa.  4. Proximal small bowel biopsies to rule out sprue.   RECOMMENDATIONS:  1. Await pathology results.  2. Avoid all nonsteroidals and aspirin for the next two weeks.  3. Proceed with colonoscopy at this time.  Further recommendations will be      after this.      Anselmo Rod, M.D.  Electronically Signed     JNM/MEDQ  D:  06/15/2006  T:  06/16/2006  Job:  [id number]   cc:   Adolph Pollack, M.D.  Cynthia P. Romine, M.D.

## 2011-02-21 NOTE — Discharge Summary (Signed)
Christina Buchanan, Christina Buchanan                ACCOUNT NO.:  [id number]   MEDICAL RECORD NO.:  [id number]          PATIENT TYPE:  INP   LOCATION:  5711                         FACILITY:  [facility]   PHYSICIAN:  Cherylynn Ridges, M.D.    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   PRINCIPAL PROCEDURE:  Right colectomy.   SURGEON:  Dr. Lindie Spruce.   DISCHARGE MEDICATIONS:  She was discharged home on Vicodin for pain.   ACTIVITY:  She was allowed to shower.   FOLLOWUP:  She is to return to see Dr. Lindie Spruce in 5-7 days.   ADDITIONAL DIAGNOSES:  None.   BRIEF SUMMARY OF HOSPITAL COURSE:  The patient was admitted the day of  surgery after an outpatient bowel prep for A right colectomy.  She had  had a colonoscopy by Dr. Loreta Ave, which showed some lesions of the right  colon.  She underwent a right colectomy without problems.  She did well  and was taking a diet by day #3 and was discharged to home.  Her wound  was healing well without infection.  She was to return to see me, Dr.  Lindie Spruce, in 5-7 days.      Cherylynn Ridges, M.D.  Electronically Signed     JOW/MEDQ  D:  09/02/2006  T:  09/03/2006  Job:  [id number]   cc:   Edwena Felty. Romine, M.D.  Anselmo Rod, M.D.

## 2011-02-21 NOTE — Op Note (Signed)
NAMECATHERINA, Christina Buchanan                ACCOUNT NO.:  [id number]   MEDICAL RECORD NO.:  [id number]          PATIENT TYPE:  INP   LOCATION:  5711                         FACILITY:  [facility]   PHYSICIAN:  Cherylynn Ridges, M.D.    DATE OF BIRTH:  04/12/1956   DATE OF PROCEDURE:  07/30/2006  DATE OF DISCHARGE:                                 OPERATIVE REPORT   PREOPERATIVE DIAGNOSIS:  Right colon villous adenoma or polyp broad base.   POSTOPERATIVE DIAGNOSIS:  Right colon villous adenoma or polyp broad base.   PROCEDURE:  Right partial colectomy with mobilization of the hepatic  flexure.   SURGEON:  Jimmye Norman, MD   ASSISTANT:  Dr. Earlene Plater   ANESTHESIA:  General endotracheal.   ESTIMATED BLOOD LOSS:  Less than 100 mL.   COMPLICATIONS:  None.   CONDITION:  Stable.   FINDINGS:  The patient had a broad base polyp and just distal to the cecum  and the proximal ascending colon with no evidence of malignancy.  The liver  was palpably normal.   INDICATION FOR OPERATION:  The patient is a 55 year old who on routine  colonoscopy was found to have a broad base polyp of the right colon, too  large for a colonoscopic removal, who now comes in for a partial colectomy.   OPERATION:  The patient was taken to the operating room, placed on the table  in supine position.  After adequate endotracheal anesthetic was administered  she was prepped in the usual sterile manner exposing her right lower  quadrant.   A right lower quadrant transverse incision was made at the level of the  umbilicus, taken down through the anterior rectus sheath, the rectus muscle,  and the posterior rectus sheath.  We entered into the peritoneal cavity  safely, and as we did so we used a Editor, commissioning in order to expose  the lateral border of the right colon at the line of Toldt.  It was at the  line of Toldt that we mobilized the right colon down including the ileocecal  valve, the appendix, which was also removed  with the cecum.  We mobilized up  to the hepatic flexure.  We were able to immobilize the colon down right to  the right transverse colon.  We can palpate the polyp and the proximal  ascending colon and we used a GIA-75 staple to come across the colon distal  to the area of the polyp and right across the ileum, just proximal to the  ileocecal valve.  The mesentery was taken in between these two areas.  A  resection using a ligature device.   Once we had completely removed the specimen and there was adequate  hemostasis we did a side-to-side function end-to-end anastomosis between the  what is distal ascending colon, which had been mobilized at the hepatic  flexure and the terminal ileum using a GIA-75 stapler.  The resulting  enterotomy was closed using a TA-60 stapler with no evidence of leakage  subsequently.  There was adequate lumen subsequently.  We closed the  mesentery using interrupted  2-0 silk sutures.  We irrigated with saline into  the right lower quadrant and found there to be no evidence of any continued  bleeding.  Then we closed the fascia in two layers with #1 PDS suture and  then the skin using stainless steel staples.  We injected 0.25% Marcaine  with epinephrine into the incision; a total of 20 cc used.  All counts were  correct.  We did change our gloves after resection of the colon and opening  the specimen off the field, after performing the anastomosis, and prior to  closing the skin.   All needle counts, sponge counts, and instrument counts were correct.      Cherylynn Ridges, M.D.  Electronically Signed     JOW/MEDQ  D:  07/30/2006  T:  07/31/2006  Job:  [id number]   cc:   Anselmo Rod, M.D.

## 2011-02-21 NOTE — Op Note (Signed)
NAME:  Christina Buchanan, Christina Buchanan                ACCOUNT NO.:  [id number]   MEDICAL RECORD NO.:  [id number]          PATIENT TYPE:  AMB   LOCATION:  ENDO                         FACILITY:  [facility]   PHYSICIAN:  Anselmo Rod, M.D.  DATE OF BIRTH:  [birth year]   DATE OF PROCEDURE:  06/16/2006  DATE OF DISCHARGE:                                 OPERATIVE REPORT   PROCEDURE:  Colonoscopy with snare polypectomy x2 and multiple cold  biopsies.   ENDOSCOPIST:  Anselmo Rod, M.D.   INSTRUMENT USED:  Olympus video colonoscope.   INDICATIONS FOR PROCEDURE:  A 55 year old white female with history of  change in bowel habits, diarrhea.  Undergoing colonoscopy to rule out  colonic polyps, masses, etc.   PREPROCEDURE PREPARATION:  Informed consent was procured from the patient.  The patient was fasted for eight hours prior to the procedure and prepped  with a Dulcolax pills and a gallon of TriLyte the night prior to the  procedure. Risks and benefits of the procedure including a 10% misread of  cancer and polyps were discussed with the patient as well.   PREPROCEDURE PHYSICAL:  VITAL SIGNS:  The patient had stable vital signs.  NECK:  Supple.  CHEST:  Clear to auscultation.  CARDIAC:  S1 and S2 regular.  ABDOMEN:  Soft with normal bowel sounds.   DESCRIPTION OF PROCEDURE:  The patient was placed in the left lateral  decubitus position and sedated with additional 50 mcg fentanyl.  Once the  patient was adequately sedated, maintained on low-flow oxygen and continuous  cardiac monitoring, the Olympus video colonoscope was advanced from the  rectum to the cecum.  The appendiceal orifice and cecum were visualized  after multiple washes.  A flat polyp was removed by snare polypectomy.  From  the cecal base (hot snare x2).  There was evidence of sigmoid  diverticulosis. There was large amount of residual stool in the groin  especially in the right side and multiple washes were done. A large polypoid  lesion was seen in the proximal right colon.  Multiple biopsies were done.  I suspect this was a villous adenoma.  Retroflexion in the rectum revealed  no abnormalities.  The patient tolerated the procedure well without  immediate complications.   IMPRESSION:  1. Flat poly removed by hot snare in the cecum (hot snare x2).  2. Sigmoid diverticulosis.  3. Large polypoid lesion biopsied in the proximal right colon, question      villous adenoma.   RECOMMENDATIONS:  1. Await pathology results.  2. Avoid all nonsteroidals including aspirin for the next two weeks.  3. Check CEA levels.  4. CT scan of the abdomen and pelvis will be done before the surgical      evaluation is made for possible right hemicolectomy.  5. Outpatient followup in the a.m. to have the above-mentioned tests      scheduled.      Anselmo Rod, M.D.  Electronically Signed     JNM/MEDQ  D:  06/15/2006  T:  06/16/2006  Job:  [id number]   cc:  Cynthia P. Romine, M.D.  Adolph Pollack, M.D.

## 2011-02-21 NOTE — Op Note (Signed)
   NAME:  Christina Buchanan, Christina Buchanan                          ACCOUNT NO.:  [id number]   MEDICAL RECORD NO.:  [id number]                   PATIENT TYPE:  AMB   LOCATION:  NESC                                 FACILITY:  [facility]   PHYSICIAN:  Cynthia P. Romine, M.D.             DATE OF BIRTH:  July 16, 1956   DATE OF PROCEDURE:  08/21/2003  DATE OF DISCHARGE:                                 OPERATIVE REPORT   POSTOPERATIVE DIAGNOSIS:  Menorrhagia.   POSTOPERATIVE DIAGNOSIS:  Menorrhagia.   PROCEDURE:  Endometrial ablation using HydroTherm ablation.   SURGEON:  Cynthia P. Romine, M.D.   ANESTHESIA:  General by LMA.   ESTIMATED BLOOD LOSS:  Minimal.   COMPLICATIONS:  None.   DESCRIPTION OF PROCEDURE:  The patient was taken to the operating room and  after the induction of adequate general anesthesia by LMA, was placed in the  dorsal lithotomy position and prepped and draped in the usual fashion.  The  bladder was drained with a red rubber catheter.  A posterior weighted and  anterior Simms retractor were placed, and the cervix was grasped off the  anterior lip with a single-tooth tenaculum.  The uterus sounded to 9 cm.  The cervix was then dilated to a #21 Pratt, and the hysteroscope was  introduced.  Proper placement of the hysteroscope was documented by noting  the presence of the tubal ostia.  Photographic documentation was taken.  The  hysteroscope was placed just inside the internal os, and endometrial  ablation was carried out using the HTA per routine.  There were no  complications.  Postoperative photographic documentation was taken.  After  the water was cooled, the scope was removed.  The instruments were removed  from the vagina, and the procedure was terminated.  The patient tolerated it  well and went in satisfactory condition to postanesthesia recovery.                                               Cynthia P. Romine, M.D.    CPR/MEDQ  D:  08/21/2003  T:  08/21/2003  Job:   [id number]

## 2011-05-01 ENCOUNTER — Other Ambulatory Visit: Payer: Self-pay | Admitting: Internal Medicine

## 2011-05-20 ENCOUNTER — Encounter: Payer: Self-pay | Admitting: Internal Medicine

## 2011-05-20 DIAGNOSIS — R635 Abnormal weight gain: Secondary | ICD-10-CM

## 2011-05-26 ENCOUNTER — Other Ambulatory Visit: Payer: Self-pay | Admitting: Internal Medicine

## 2011-06-24 ENCOUNTER — Other Ambulatory Visit: Payer: Self-pay | Admitting: *Deleted

## 2011-06-24 MED ORDER — SIMVASTATIN 20 MG PO TABS: 20.0000 mg | ORAL_TABLET | Freq: Every day | ORAL | Status: DC

## 2011-09-01 ENCOUNTER — Telehealth: Payer: Self-pay | Admitting: *Deleted

## 2011-09-01 NOTE — Telephone Encounter (Signed)
Pt had left a message in Friday requesting md to send antibiotic in for sinus infection. Called pt back this am. Inform pt that we was out of office sorry that she did'nt get a call back.Did inform pt that she would need ov for antibiotic.  Pt states she went to urgent care on Saturday, but if problem continue will make f/u appt....09/01/11@1 :53pm/LMB

## 2011-09-10 ENCOUNTER — Other Ambulatory Visit: Payer: Self-pay | Admitting: Obstetrics and Gynecology

## 2011-09-10 DIAGNOSIS — Z1231 Encounter for screening mammogram for malignant neoplasm of breast: Secondary | ICD-10-CM

## 2011-09-15 ENCOUNTER — Other Ambulatory Visit: Payer: Self-pay | Admitting: Internal Medicine

## 2011-09-18 ENCOUNTER — Other Ambulatory Visit: Payer: Self-pay | Admitting: Internal Medicine

## 2011-10-09 ENCOUNTER — Ambulatory Visit
Admission: RE | Admit: 2011-10-09 | Discharge: 2011-10-09 | Disposition: A | Discharge status: Home / Self Care | Payer: Self-pay | Source: Ambulatory Visit | Attending: Obstetrics and Gynecology | Admitting: Obstetrics and Gynecology

## 2011-10-09 DIAGNOSIS — Z1231 Encounter for screening mammogram for malignant neoplasm of breast: Secondary | ICD-10-CM

## 2011-10-15 ENCOUNTER — Other Ambulatory Visit: Payer: Self-pay | Admitting: Obstetrics and Gynecology

## 2011-10-15 DIAGNOSIS — R928 Other abnormal and inconclusive findings on diagnostic imaging of breast: Secondary | ICD-10-CM

## 2011-10-22 ENCOUNTER — Ambulatory Visit
Admission: RE | Admit: 2011-10-22 | Discharge: 2011-10-22 | Disposition: A | Discharge status: Home / Self Care | Payer: BC Managed Care – PPO | Source: Ambulatory Visit | Attending: Obstetrics and Gynecology | Admitting: Obstetrics and Gynecology

## 2011-10-22 DIAGNOSIS — R928 Other abnormal and inconclusive findings on diagnostic imaging of breast: Secondary | ICD-10-CM

## 2011-11-08 ENCOUNTER — Other Ambulatory Visit: Payer: Self-pay | Admitting: Internal Medicine

## 2011-12-08 ENCOUNTER — Other Ambulatory Visit: Payer: Self-pay | Admitting: Internal Medicine

## 2011-12-12 ENCOUNTER — Other Ambulatory Visit: Payer: Self-pay | Admitting: Internal Medicine

## 2011-12-12 ENCOUNTER — Ambulatory Visit (INDEPENDENT_AMBULATORY_CARE_PROVIDER_SITE_OTHER): Payer: BC Managed Care – PPO | Admitting: Internal Medicine

## 2011-12-12 ENCOUNTER — Other Ambulatory Visit (INDEPENDENT_AMBULATORY_CARE_PROVIDER_SITE_OTHER): Payer: BC Managed Care – PPO

## 2011-12-12 ENCOUNTER — Encounter: Payer: Self-pay | Admitting: Internal Medicine

## 2011-12-12 VITALS — BP 110/82 | HR 75 | Temp 98.3°F | Ht 63.0 in | Wt 153.1 lb

## 2011-12-12 DIAGNOSIS — Z0000 Encounter for general adult medical examination without abnormal findings: Secondary | ICD-10-CM

## 2011-12-12 DIAGNOSIS — R7989 Other specified abnormal findings of blood chemistry: Secondary | ICD-10-CM | POA: Insufficient documentation

## 2011-12-12 LAB — URINALYSIS
Bilirubin Urine: NEGATIVE
Ketones, ur: NEGATIVE
Leukocytes, UA: NEGATIVE
Nitrite: NEGATIVE
Specific Gravity, Urine: >=1.03 (ref 1.000–1.030)
Total Protein, Urine: NEGATIVE
Urine Glucose: NEGATIVE
Urobilinogen, UA: 0.2 (ref 0.0–1.0)
pH: 5.5 (ref 5.0–8.0)

## 2011-12-12 LAB — HEPATIC FUNCTION PANEL
ALT: 50 U/L — ABNORMAL HIGH (ref 0–35)
AST: 40 U/L — ABNORMAL HIGH (ref 0–37)
Albumin: 4.4 g/dL (ref 3.5–5.2)
Alkaline Phosphatase: 56 U/L (ref 39–117)
Bilirubin, Direct: 0.1 mg/dL (ref 0.0–0.3)
Total Bilirubin: 0.4 mg/dL (ref 0.3–1.2)
Total Protein: 7.7 g/dL (ref 6.0–8.3)

## 2011-12-12 LAB — BASIC METABOLIC PANEL
BUN: 13 mg/dL (ref 6–23)
CO2: 29 mEq/L (ref 19–32)
Calcium: 9.1 mg/dL (ref 8.4–10.5)
Chloride: 102 mEq/L (ref 96–112)
Creatinine, Ser: 0.6 mg/dL (ref 0.4–1.2)
GFR: 107.5 mL/min (ref >60.00)
Glucose, Bld: 129 mg/dL — ABNORMAL HIGH (ref 70–99)
Potassium: 3.9 mEq/L (ref 3.5–5.1)
Sodium: 139 mEq/L (ref 135–145)

## 2011-12-12 LAB — CBC WITH DIFFERENTIAL/PLATELET
Basophils Absolute: 0 10*3/uL (ref 0.0–0.1)
Basophils Relative: 0.5 % (ref 0.0–3.0)
Eosinophils Absolute: 0.1 10*3/uL (ref 0.0–0.7)
Eosinophils Relative: 2 % (ref 0.0–5.0)
HCT: 42.8 % (ref 36.0–46.0)
Hemoglobin: 14.6 g/dL (ref 12.0–15.0)
Lymphocytes Relative: 33.4 % (ref 12.0–46.0)
Lymphs Abs: 2.3 10*3/uL (ref 0.7–4.0)
MCHC: 34.2 g/dL (ref 30.0–36.0)
MCV: 95.2 fl (ref 78.0–100.0)
Monocytes Absolute: 0.6 10*3/uL (ref 0.1–1.0)
Monocytes Relative: 7.9 % (ref 3.0–12.0)
Neutro Abs: 3.9 10*3/uL (ref 1.4–7.7)
Neutrophils Relative %: 56.2 % (ref 43.0–77.0)
Platelets: 269 10*3/uL (ref 150.0–400.0)
RBC: 4.5 Mil/uL (ref 3.87–5.11)
RDW: 12.3 % (ref 11.5–14.6)
WBC: 7 10*3/uL (ref 4.5–10.5)

## 2011-12-12 LAB — LIPID PANEL
Cholesterol: 220 mg/dL — ABNORMAL HIGH (ref 0–200)
HDL: 46.8 mg/dL (ref >39.00)
Total CHOL/HDL Ratio: 5
Triglycerides: 157 mg/dL — ABNORMAL HIGH (ref 0.0–149.0)
VLDL: 31.4 mg/dL (ref 0.0–40.0)

## 2011-12-12 LAB — TSH: TSH: 0.92 u[IU]/mL (ref 0.35–5.50)

## 2011-12-12 LAB — LDL CHOLESTEROL, DIRECT: Direct LDL: 153.6 mg/dL

## 2011-12-12 MED ORDER — SIMVASTATIN 20 MG PO TABS: 20.0000 mg | ORAL_TABLET | Freq: Every day | ORAL | Status: DC

## 2011-12-12 NOTE — Progress Notes (Signed)
Subjective:    Patient ID: Christina Buchanan, female    DOB: 1956/09/12, 56 y.o.   MRN: [id number]  HPI patient is here today for annual physical. Patient feels well and has no complaints.  Past Medical History  Diagnosis Date  . ALLERGIC RHINITIS   . DEPRESSION   . HYPERLIPIDEMIA   . Personal history of colonic polyps   . SMOKER   . VITAMIN D DEFICIENCY    Family History  Problem Relation Age of Onset  . Breast cancer Mother   . Diabetes Mother   . Stroke Mother   . Hyperlipidemia Mother   . Hypertension Father   . Heart disease Father   . Hyperlipidemia Father   . Breast cancer Sister    History  Substance Use Topics  . Smoking status: Current Everyday Smoker    Types: Cigarettes  . Smokeless tobacco: Not on file   Comment: Married, lives with spouse. Cares for elderly parent and in-laws  . Alcohol Use: No    Review of Systems Constitutional: Negative for fever or weight change.  Respiratory: Negative for cough and shortness of breath.   Cardiovascular: Negative for chest pain or palpitations.  Gastrointestinal: Negative for abdominal pain, no bowel changes.  Musculoskeletal: Negative for gait problem or joint swelling.  Skin: Negative for rash.  Neurological: Negative for dizziness or headache.  No other specific complaints in a complete review of systems (except as listed in HPI above).     Objective:   Physical Exam BP 110/82  Pulse 75  Temp(Src) 98.3 F (36.8 C) (Oral)  Ht 5\' 3"  (1.6 m)  Wt 153 lb 1.9 oz (69.455 kg)  BMI 27.12 kg/m2  SpO2 98% Wt Readings from Last 3 Encounters:  12/12/11 153 lb 1.9 oz (69.455 kg)  04/02/10 146 lb 12.8 oz (66.588 kg)  01/28/10 148 lb (67.132 kg)   Constitutional: She appears well-developed and well-nourished. No distress.  HENT: Head: Normocephalic and atraumatic. Ears: B TMs ok, no erythema or effusion; Nose: Nose normal.  Mouth/Throat: Oropharynx is clear and moist. No oropharyngeal exudate.  Eyes: Conjunctivae and  EOM are normal. Pupils are equal, round, and reactive to light. No scleral icterus.  Neck: Normal range of motion. Neck supple. No JVD present. No thyromegaly present.  Cardiovascular: Normal rate, regular rhythm and normal heart sounds.  No murmur heard. No BLE edema. Pulmonary/Chest: Effort normal and breath sounds normal. No respiratory distress. She has no wheezes.  Abdominal: Soft. Bowel sounds are normal. She exhibits no distension. There is no tenderness. no masses Musculoskeletal: Normal range of motion, no joint effusions. No gross deformities Neurological: She is alert and oriented to person, place, and time. No cranial nerve deficit. Coordination normal.  Skin: Skin is warm and dry. No rash noted. No erythema.  Psychiatric: She has a normal mood and affect. Her behavior is normal. Judgment and thought content normal.   Lab Results  Component Value Date   WBC 6.7 03/28/2010   HGB 13.9 03/28/2010   HCT 40.0 03/28/2010   PLT 220.0 03/28/2010   GLUCOSE 105* 03/28/2010   CHOL 162 03/28/2010   TRIG 83.0 03/28/2010   HDL 44.40 03/28/2010   LDLCALC 101* 03/28/2010   ALT 29 03/28/2010   AST 25 03/28/2010   NA 142 03/28/2010   K 4.3 03/28/2010   CL 109 03/28/2010   CREATININE 0.5 03/28/2010   BUN 15 03/28/2010   CO2 27 03/28/2010   TSH 1.00 03/28/2010   HGBA1C 5.5 03/16/2002  EKG: sinus @ 73 bpm - no ischemic changes or arrythmia     Assessment & Plan:  CPX - v70.0 - Patient has been counseled on age-appropriate routine health concerns for screening and prevention. These are reviewed and up-to-date. Immunizations are up-to-date or declined. Labs and ECG reviewed.

## 2011-12-12 NOTE — Patient Instructions (Addendum)
It was good to see you today. Test(s) ordered today. Your results will be called to you after review (48-72hours after test completion). If any changes need to be made, you will be notified at that time. Health Maintenance reviewed - everything is up to date. Medications reviewed, no changes at this time. Refill on medication(s) as discussed today. Please schedule followup in 12 months for your annual physical and labs, call sooner if problems. Work on lifestyle changes as discussed (low fat, low carb, increased protein diet; improved exercise efforts; weight loss) to control sugar, blood pressure and cholesterol levels and/or reduce risk of developing other medical problems. Look into [email or web address] or other type of food journal to assist you in this process.

## 2012-01-28 ENCOUNTER — Other Ambulatory Visit (INDEPENDENT_AMBULATORY_CARE_PROVIDER_SITE_OTHER): Payer: BC Managed Care – PPO

## 2012-01-28 DIAGNOSIS — R7989 Other specified abnormal findings of blood chemistry: Secondary | ICD-10-CM

## 2012-01-28 LAB — HEPATIC FUNCTION PANEL
ALT: 39 U/L — ABNORMAL HIGH (ref 0–35)
AST: 34 U/L (ref 0–37)
Albumin: 4.1 g/dL (ref 3.5–5.2)
Alkaline Phosphatase: 58 U/L (ref 39–117)
Bilirubin, Direct: 0 mg/dL (ref 0.0–0.3)
Total Bilirubin: 0.2 mg/dL — ABNORMAL LOW (ref 0.3–1.2)
Total Protein: 7.7 g/dL (ref 6.0–8.3)

## 2012-02-17 LAB — HM PAP SMEAR: HM Pap smear: NEGATIVE

## 2012-05-08 ENCOUNTER — Other Ambulatory Visit: Payer: Self-pay | Admitting: Internal Medicine

## 2012-05-26 ENCOUNTER — Other Ambulatory Visit: Payer: Self-pay

## 2012-11-30 ENCOUNTER — Other Ambulatory Visit: Payer: Self-pay

## 2012-11-30 ENCOUNTER — Other Ambulatory Visit: Payer: Self-pay | Admitting: Obstetrics and Gynecology

## 2012-11-30 DIAGNOSIS — Z1231 Encounter for screening mammogram for malignant neoplasm of breast: Secondary | ICD-10-CM

## 2012-12-15 ENCOUNTER — Other Ambulatory Visit: Payer: Self-pay | Admitting: *Deleted

## 2012-12-15 MED ORDER — SIMVASTATIN 20 MG PO TABS: 20.0000 mg | ORAL_TABLET | Freq: Every day | ORAL | Status: DC

## 2012-12-30 ENCOUNTER — Ambulatory Visit
Admission: RE | Admit: 2012-12-30 | Discharge: 2012-12-30 | Disposition: A | Discharge status: Home / Self Care | Payer: BC Managed Care – PPO | Source: Ambulatory Visit

## 2012-12-30 DIAGNOSIS — Z1231 Encounter for screening mammogram for malignant neoplasm of breast: Secondary | ICD-10-CM

## 2013-02-16 ENCOUNTER — Encounter: Payer: Self-pay | Admitting: *Deleted

## 2013-02-21 ENCOUNTER — Ambulatory Visit: Payer: Self-pay | Admitting: Nurse Practitioner

## 2013-02-25 ENCOUNTER — Telehealth: Payer: Self-pay | Admitting: *Deleted

## 2013-02-25 NOTE — Telephone Encounter (Signed)
Patient last seen for Aex 02/17/12 Can this year's Aex and has not R/S  Ok to give 30 day?

## 2013-02-25 NOTE — Telephone Encounter (Signed)
Pt may have a refill # 90 ? Orf until AEX

## 2013-03-01 MED ORDER — ESCITALOPRAM OXALATE 10 MG PO TABS: 10.0000 mg | ORAL_TABLET | Freq: Every day | ORAL | Status: DC

## 2013-03-01 NOTE — Addendum Note (Signed)
Addended by: Dorinda Hill on: 03/01/2013 10:26 AM   Modules accepted: Orders

## 2013-03-31 ENCOUNTER — Other Ambulatory Visit: Payer: Self-pay | Admitting: Internal Medicine

## 2013-03-31 ENCOUNTER — Telehealth: Payer: Self-pay

## 2013-04-19 ENCOUNTER — Ambulatory Visit (INDEPENDENT_AMBULATORY_CARE_PROVIDER_SITE_OTHER): Payer: BC Managed Care – PPO | Admitting: Nurse Practitioner

## 2013-04-19 ENCOUNTER — Encounter: Payer: Self-pay | Admitting: Nurse Practitioner

## 2013-04-19 VITALS — BP 114/62 | HR 64 | Resp 12 | Ht 64.0 in | Wt 149.6 lb

## 2013-04-19 DIAGNOSIS — Z01419 Encounter for gynecological examination (general) (routine) without abnormal findings: Secondary | ICD-10-CM

## 2013-04-19 DIAGNOSIS — E559 Vitamin D deficiency, unspecified: Secondary | ICD-10-CM

## 2013-04-19 DIAGNOSIS — Z0000 Encounter for general adult medical examination without abnormal findings: Secondary | ICD-10-CM

## 2013-04-19 LAB — POCT URINALYSIS DIPSTICK
Leukocytes, UA: NEGATIVE
Spec Grav, UA: 1.015
Urobilinogen, UA: NEGATIVE
pH, UA: 6

## 2013-04-19 MED ORDER — ESCITALOPRAM OXALATE 10 MG PO TABS: 10.0000 mg | ORAL_TABLET | Freq: Every day | ORAL | Status: DC

## 2013-04-19 MED ORDER — VITAMIN D (ERGOCALCIFEROL) 1.25 MG (50000 UNIT) PO CAPS: 50000.0000 [IU] | ORAL_CAPSULE | ORAL | Status: DC

## 2013-04-19 NOTE — Patient Instructions (Signed)

## 2013-04-19 NOTE — Progress Notes (Signed)
57 y.o. G3P3 Married Caucasian Fe here for annual exam.  No new health problems. Doing well on Lexapro and wants to continue.  No LMP recorded. Patient is postmenopausal.          Sexually active: yes  The current method of family planning is vasectomy.    Exercising: no  The patient does not participate in regular exercise at present. Smoker:  yes  Health Maintenance: Pap:  02/17/2012  Normal with negative HR HPV MMG:  10/2012 normal Colonoscopy:  11/13/2008 recheck in 5 years BMD:   2007, per pt.  TDaP:  02/2012 Labs: PCP maintains all labs.    reports that she has been smoking Cigarettes.  She has a 7.5 pack-year smoking history. She has never used smokeless tobacco. She reports that she drinks about 0.5 ounces of alcohol per week. She reports that she does not use illicit drugs.  Past Medical History  Diagnosis Date  . ALLERGIC RHINITIS   . DEPRESSION   . HYPERLIPIDEMIA   . Personal history of colonic polyps   . SMOKER   . VITAMIN D DEFICIENCY   . Diabetes mellitus without complication 2003    Past Surgical History  Procedure Laterality Date  . Right colectomy  2008  . Breast surgery  1992    breast biopsy    Current Outpatient Prescriptions  Medication Sig Dispense Refill  . Ascorbic Acid (VITAMIN C) 100 MG tablet Take 100 mg by mouth daily.      [name] aspirin 81 MG tablet Take 81 mg by mouth daily.        [name] azelastine (ASTELIN) 137 MCG/SPRAY nasal spray USE ONE SPRAY IN EACH NOSTRIL DAILY AS NEEDED  30 mL  11  . Calcium Carbonate-Vitamin D (CALTRATE 600+D) 600-400 MG-UNIT per tablet Take 1 tablet by mouth daily.        . cetirizine (ZYRTEC) 10 MG tablet Take 10 mg by mouth daily.        . Cholecalciferol (VITAMIN D3) 1000 UNITS CAPS Take by mouth 2 (two) times daily.        [name] escitalopram (LEXAPRO) 10 MG tablet Take 1 tablet (10 mg total) by mouth daily.  90 tablet  0  . simvastatin (ZOCOR) 20 MG tablet Take 1 tablet (20 mg total) by mouth at bedtime.  30 tablet  11  .  thiamine (VITAMIN B-1) 100 MG tablet Take 100 mg by mouth daily.       No current facility-administered medications for this visit.    Family History  Problem Relation Age of Onset  . Breast cancer Mother   . Diabetes Mother   . Stroke Mother   . Hyperlipidemia Mother   . Hypertension Father   . Heart disease Father   . Hyperlipidemia Father   . Breast cancer Sister     ROS:  Pertinent items are noted in HPI.  Otherwise, a comprehensive ROS was negative.  Exam:   BP 114/62  Pulse 64  Resp 12  Ht 5\' 4"  (1.626 m)  Wt 149 lb 9.6 oz (67.858 kg)  BMI 25.67 kg/m2 Height: 5\' 4"  (162.6 cm)  Ht Readings from Last 3 Encounters:  04/19/13 5\' 4"  (1.626 m)  12/12/11 5\' 3"  (1.6 m)  04/02/10 5\' 3"  (1.6 m)    General appearance: alert, cooperative and appears stated age Head: Normocephalic, without obvious abnormality, atraumatic Neck: no adenopathy, supple, symmetrical, trachea midline and thyroid normal to inspection and palpation Lungs: clear to auscultation bilaterally Breasts: normal appearance,  no masses or tenderness Heart: regular rate and rhythm Abdomen: soft, non-tender; no masses,  no organomegaly Extremities: extremities normal, atraumatic, no cyanosis or edema Skin: Skin color, texture, turgor normal. No rashes or lesions Lymph nodes: Cervical, supraclavicular, and axillary nodes normal. No abnormal inguinal nodes palpated Neurologic: Grossly normal   Pelvic: External genitalia:  no lesions              Urethra:  normal appearing urethra with no masses, tenderness or lesions              Bartholin's and Skene's: normal                 Vagina: normal appearing vagina with normal color and discharge, no lesions              Cervix: anteverted              Pap taken: no Bimanual Exam:  Uterus:  normal size, contour, position, consistency, mobility, non-tender              Adnexa: no mass, fullness, tenderness               Rectovaginal: Confirms               Anus:   normal sphincter tone, no lesions  A:  Well Woman with normal exam  Postmenopausal  Situational stressors  S/P HTA 08/2003  [facility]: [place] cancer mother and sister  P:   Pap smear as per guidelines   Mammogram due 10/2013  Refill on Lexapro for 1 year  Info given about BRCA testing - encourage her sister to get tested then herself if  positive  counseled on adequate intake of calcium and vitamin D, diet and exercise return annually or prn  An After Visit Summary was printed and given to the patient.

## 2013-04-20 ENCOUNTER — Telehealth: Payer: Self-pay | Admitting: *Deleted

## 2013-04-20 LAB — VITAMIN D 25 HYDROXY (VIT D DEFICIENCY, FRACTURES): Vit D, 25-Hydroxy: 49 ng/mL (ref 30–89)

## 2013-04-20 NOTE — Telephone Encounter (Signed)
Left Message To Call Back me today/ tiff tomorrow. Re: Lab Results

## 2013-04-20 NOTE — Telephone Encounter (Signed)
Pt notified of vit d results (see labs)

## 2013-04-20 NOTE — Telephone Encounter (Signed)
Message copied by Lorraine Lax on Wed Apr 20, 2013  8:41 AM ------      Message from: Ria Comment R      Created: Wed Apr 20, 2013  8:23 AM       Let pt know results and follow protocol ------

## 2013-04-21 NOTE — Progress Notes (Signed)
Encounter reviewed by Dr. Hanifa Antonetti Silva.  

## 2013-08-11 ENCOUNTER — Other Ambulatory Visit: Payer: Self-pay

## 2013-11-23 ENCOUNTER — Other Ambulatory Visit: Payer: Self-pay

## 2013-11-23 DIAGNOSIS — Z803 Family history of malignant neoplasm of breast: Secondary | ICD-10-CM

## 2013-11-23 DIAGNOSIS — Z1231 Encounter for screening mammogram for malignant neoplasm of breast: Secondary | ICD-10-CM

## 2013-12-19 ENCOUNTER — Other Ambulatory Visit: Payer: Self-pay | Admitting: Internal Medicine

## 2013-12-20 ENCOUNTER — Other Ambulatory Visit: Payer: Self-pay | Admitting: Internal Medicine

## 2014-01-03 ENCOUNTER — Ambulatory Visit
Admission: RE | Admit: 2014-01-03 | Discharge: 2014-01-03 | Disposition: A | Discharge status: Home / Self Care | Payer: BC Managed Care – PPO | Source: Ambulatory Visit

## 2014-01-03 DIAGNOSIS — Z803 Family history of malignant neoplasm of breast: Secondary | ICD-10-CM

## 2014-01-03 DIAGNOSIS — Z1231 Encounter for screening mammogram for malignant neoplasm of breast: Secondary | ICD-10-CM

## 2014-02-12 ENCOUNTER — Other Ambulatory Visit: Payer: Self-pay | Admitting: Internal Medicine

## 2014-04-20 ENCOUNTER — Encounter: Payer: Self-pay | Admitting: Nurse Practitioner

## 2014-04-20 ENCOUNTER — Ambulatory Visit (INDEPENDENT_AMBULATORY_CARE_PROVIDER_SITE_OTHER): Payer: BC Managed Care – PPO | Admitting: Nurse Practitioner

## 2014-04-20 VITALS — BP 118/76 | HR 76 | Ht 64.0 in | Wt 152.0 lb

## 2014-04-20 DIAGNOSIS — Z0000 Encounter for general adult medical examination without abnormal findings: Secondary | ICD-10-CM

## 2014-04-20 DIAGNOSIS — F418 Other specified anxiety disorders: Secondary | ICD-10-CM

## 2014-04-20 DIAGNOSIS — Z01419 Encounter for gynecological examination (general) (routine) without abnormal findings: Secondary | ICD-10-CM

## 2014-04-20 DIAGNOSIS — R81 Glycosuria: Secondary | ICD-10-CM

## 2014-04-20 DIAGNOSIS — E2839 Other primary ovarian failure: Secondary | ICD-10-CM

## 2014-04-20 DIAGNOSIS — F411 Generalized anxiety disorder: Secondary | ICD-10-CM

## 2014-04-20 LAB — POCT URINALYSIS DIPSTICK
Bilirubin, UA: NEGATIVE
Blood, UA: NEGATIVE
Glucose, UA: 250
Ketones, UA: NEGATIVE
Leukocytes, UA: NEGATIVE
Nitrite, UA: NEGATIVE
Protein, UA: NEGATIVE
Urobilinogen, UA: NEGATIVE
pH, UA: 5

## 2014-04-20 LAB — HEMOGLOBIN, FINGERSTICK: Hemoglobin, fingerstick: 15 g/dL (ref 12.0–16.0)

## 2014-04-20 LAB — HEMOGLOBIN A1C
Hgb A1c MFr Bld: 6.4 % — ABNORMAL HIGH (ref <5.7)
Mean Plasma Glucose: 137 mg/dL — ABNORMAL HIGH (ref <117)

## 2014-04-20 MED ORDER — ESCITALOPRAM OXALATE 10 MG PO TABS: 10.0000 mg | ORAL_TABLET | Freq: Every day | ORAL | Status: DC

## 2014-04-20 NOTE — Progress Notes (Signed)
Note reviewed, agree with plan.  Darreon Lutes, MD  

## 2014-04-20 NOTE — Progress Notes (Signed)
Patient ID: Christina Buchanan, female   DOB: Mar 31, 1956, 58 y.o.   MRN: [id number] 58 y.o. G3P3 Married Caucasian Fe here for annual exam.  No new health problems.  Feels well.  Patient's last menstrual period was 03/06/2006.          Sexually active: yes  The current method of family planning is vasectomy.  Exercising: no The patient does not participate in regular exercise at present.  Smoker: yes, 3 cigarettes per day  Health Maintenance:  Pap: 02/17/2012 Normal with negative HR HPV  MMG: 01/03/14, Bi-Rads 1: negative  Colonoscopy: 11/13/2008 recheck in 5 years, needs to schedule--received letter from [facility]: 2007, per pt.  TDaP: 02/2012  Labs:  HB:  15.0  Urine:  250 glucose      reports that she has been smoking Cigarettes.  She has a 7.5 pack-year smoking history. She has never used smokeless tobacco. She reports that she drinks about .5 ounces of alcohol per week. She reports that she does not use illicit drugs.  Past Medical History  Diagnosis Date  . ALLERGIC RHINITIS   . DEPRESSION   . HYPERLIPIDEMIA   . Personal history of colonic polyps   . SMOKER   . VITAMIN D DEFICIENCY   . Diabetes mellitus without complication 4536    Past Surgical History  Procedure Laterality Date  . 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 Breast surgery  1990    breast biopsy  . Endometrial ablation  08/21/03    HTA  . Colonoscopy w/ biopsies  9/07    2 polyps & tubuvillous adenoma  . Upper gastrointestinal endoscopy  06/15/06    2 polyps - H pylori  . Colonoscopy w/ biopsies  11/13/08    sigmoid ddiverticuli and rechek in 5 years - Dr. Collene Mares    Current Outpatient Prescriptions  Medication Sig Dispense Refill  . Ascorbic Acid (VITAMIN C) 100 MG tablet Take 100 mg by mouth daily.      [name] aspirin 81 MG tablet Take 81 mg by mouth daily.        [name] azelastine (ASTELIN) 137 MCG/SPRAY nasal spray USE ONE SPRAY IN EACH NOSTRIL DAILY AS NEEDED   30 mL  11  . Calcium Carbonate-Vitamin D (CALTRATE 600+D) 600-400 MG-UNIT per tablet Take 1 tablet by mouth daily.        . cholecalciferol (VITAMIN D) 1000 UNITS tablet Take 1,000 Units by mouth daily.      [name] escitalopram (LEXAPRO) 10 MG tablet Take 1 tablet (10 mg total) by mouth daily.  90 tablet  3  . simvastatin (ZOCOR) 20 MG tablet Take one tablet by mouth at bedtime  30 tablet  10  . thiamine (VITAMIN B-1) 100 MG tablet Take 100 mg by mouth daily.       No current facility-administered medications for this visit.    Family History  Problem Relation Age of Onset  . Breast cancer Mother 71  . Diabetes Mother   . Stroke Mother            . Hyperlipidemia Mother   . Hypertension Father   . Heart disease Father   . Hyperlipidemia Father   . Heart failure Father     dec at age 83  . Breast cancer Sister 38    ROS:  Pertinent items are noted in HPI.  Otherwise, a comprehensive ROS was negative.  Exam:   BP 118/76  Pulse 76  Ht 5\' 4"  (1.626 m)  Wt 152 lb (68.947 kg)  BMI 26.08 kg/m2  LMP 03/06/2006 Height: 5\' 4"  (162.6 cm)  Ht Readings from Last 3 Encounters:  04/20/14 5\' 4"  (1.626 m)  04/19/13 5\' 4"  (1.626 m)  12/12/11 5\' 3"  (1.6 m)    General appearance: alert, cooperative and appears stated age Head: Normocephalic, without obvious abnormality, atraumatic Neck: no adenopathy, supple, symmetrical, trachea midline and thyroid normal to inspection and palpation Lungs: clear to auscultation bilaterally Breasts: normal appearance, no masses or tenderness Heart: regular rate and rhythm Abdomen: soft, non-tender; no masses,  no organomegaly Extremities: extremities normal, atraumatic, no cyanosis or edema Skin: Skin color, texture, turgor normal. No rashes or lesions Lymph nodes: Cervical, supraclavicular, and axillary nodes normal. No abnormal inguinal nodes palpated Neurologic: Grossly normal   Pelvic: External genitalia:  no lesions              Urethra:  normal  appearing urethra with no masses, tenderness or lesions              Bartholin's and Skene's: normal                 Vagina: normal appearing vagina with normal color and discharge, no lesions              Cervix: anteverted              Pap taken: No. Bimanual Exam:  Uterus:  normal size, contour, position, consistency, mobility, non-tender              Adnexa: no mass, fullness, tenderness               Rectovaginal: Confirms               Anus:  normal sphincter tone, no lesions  A:  Well Woman with normal exam  History of situational anxiety  postmenopausal  P:   Reviewed health and wellness pertinent to exam  Pap smear taken today  Mammogram is due 3/16  Will follow with [facility]  Refill on Lexapro for a year  Counseled on breast self exam, mammography screening, adequate intake of calcium and vitamin D, diet and exercise, Kegel's exercises return annually or prn  An After Visit Summary was printed and given to the patient.

## 2014-04-20 NOTE — Patient Instructions (Signed)

## 2014-04-25 ENCOUNTER — Telehealth: Payer: Self-pay | Admitting: *Deleted

## 2014-04-25 ENCOUNTER — Encounter: Payer: Self-pay | Admitting: Internal Medicine

## 2014-04-25 NOTE — Telephone Encounter (Signed)
I have attempted to contact this patient by phone with the following results: left message to return my call on answering machine (mobile per DPR).[phone] (Mobile)

## 2014-04-25 NOTE — Telephone Encounter (Signed)
Pt notified in result note.  

## 2014-04-25 NOTE — Telephone Encounter (Signed)
Message copied by Graylon Good on Tue Apr 25, 2014  2:51 PM ------      Message from: Kem Boroughs R      Created: Sun Apr 23, 2014  5:23 PM       Results via my chart - see the note to call her:            Christina Buchanan, your average 3 month blood sugar does show that you have diabetes.  It may be borderline but you do need treatment.  The average blood sugar was 137.  As you said earlier test were negative for diabetes - but this one does need attention.  If will have Colletta [place] to call you and arrange PCP appointment if needed. ------

## 2014-06-25 ENCOUNTER — Other Ambulatory Visit: Payer: Self-pay | Admitting: Internal Medicine

## 2014-08-01 ENCOUNTER — Encounter: Payer: Self-pay | Admitting: Internal Medicine

## 2014-08-01 DIAGNOSIS — R200 Anesthesia of skin: Secondary | ICD-10-CM

## 2014-08-07 ENCOUNTER — Encounter: Payer: Self-pay | Admitting: Nurse Practitioner

## 2014-08-18 ENCOUNTER — Telehealth: Payer: Self-pay | Admitting: Internal Medicine

## 2014-08-18 NOTE — Telephone Encounter (Signed)
Attempted to reschedule this patient's appt on 11/18 due to provider n/a. Pt is very upset, she wants to bumped ahead of other patients for another appt. She has waited 3 mos for appt and feels it is not fair she has to reschedule her appt but yet people on the following day do not. I offered next available and a CPE with another provider. Pt refused. She wants to know if when refills come in from the pharmacy they can be refilled. Pt wanted to let provider know.

## 2014-08-19 NOTE — Telephone Encounter (Signed)
Yes, refills will be filled when due Ok to try to find a place if any available - And please let patient know I apologize for my change in availability

## 2014-08-19 NOTE — Telephone Encounter (Signed)
LVM for pt to call back.   RE: rescheduling.

## 2014-08-23 ENCOUNTER — Encounter: Payer: Self-pay | Admitting: Internal Medicine

## 2014-08-30 ENCOUNTER — Telehealth: Payer: Self-pay | Admitting: Internal Medicine

## 2014-08-30 NOTE — Telephone Encounter (Signed)
Pt is requesting Stefannie to give her a call.  She is wanting antibiotic without being seen.    i tried to get her in to see someone.  She refused and wanted to just talk to you.    Number is [phone]

## 2014-08-30 NOTE — Telephone Encounter (Signed)
Spoke to pt and informed that an abx would not be rxed without an appt.  Offered to schedule with another provider and pt declined.

## 2014-09-19 ENCOUNTER — Ambulatory Visit (INDEPENDENT_AMBULATORY_CARE_PROVIDER_SITE_OTHER): Payer: BC Managed Care – PPO | Admitting: Neurology

## 2014-09-19 ENCOUNTER — Encounter: Payer: Self-pay | Admitting: Neurology

## 2014-09-19 VITALS — BP 118/70 | HR 86 | Ht 64.0 in | Wt 155.5 lb

## 2014-09-19 DIAGNOSIS — M25541 Pain in joints of right hand: Secondary | ICD-10-CM

## 2014-09-19 DIAGNOSIS — Z720 Tobacco use: Secondary | ICD-10-CM

## 2014-09-19 DIAGNOSIS — M79642 Pain in left hand: Secondary | ICD-10-CM

## 2014-09-19 DIAGNOSIS — G5622 Lesion of ulnar nerve, left upper limb: Secondary | ICD-10-CM

## 2014-09-19 DIAGNOSIS — G5623 Lesion of ulnar nerve, bilateral upper limbs: Secondary | ICD-10-CM

## 2014-09-19 DIAGNOSIS — M79641 Pain in right hand: Secondary | ICD-10-CM

## 2014-09-19 DIAGNOSIS — G5621 Lesion of ulnar nerve, right upper limb: Secondary | ICD-10-CM

## 2014-09-19 DIAGNOSIS — M25542 Pain in joints of left hand: Secondary | ICD-10-CM

## 2014-09-19 DIAGNOSIS — F17200 Nicotine dependence, unspecified, uncomplicated: Secondary | ICD-10-CM

## 2014-09-19 NOTE — Patient Instructions (Addendum)
1.  EMG of the upper extremities for ulnar neuropathy 2.  Start using elbow pad to prevent hyperflexion 3.  Constant hand achy pain seems to be more joint-related and you may have some arthritis.  Please discuss with your primary care doctor to see what options are available for this discomfort 4.  Tobacco cessation encouraged 5.  Return to clinic in 2 months

## 2014-09-19 NOTE — Progress Notes (Signed)
[facility] Clinic Note - Initial Visit   Date: 09/19/2014  Christina Buchanan MRN: [id number] DOB: [birth year]   Dear Dr. Asa Lente:  Thank you for your kind referral of Christina Buchanan for consultation of bilateral hand pain and paresthesias. Although her history is well known to you, please allow [place] to reiterate it for the purpose of our medical record. The patient was accompanied to the clinic by self.    History of Present Illness: Christina Buchanan is a 58 y.o. right-handed Caucasian female with depression, hyperlipidemia, and tobacco use presenting for evaluation of bilateral hand pain and paresthesias.    Starting around 2005, she developed numbness of her right hand, especially when putting her make-up on and drying her hair.  She was told she had tennis elbow and recommend wearing a wrist brace, which did not help.  Over the years, these symptoms worsened and especially over the past 12 months her left hand started having the same problems.  Currently, she has intermittent numbness and tingling of her hands, triggered by driving, putting make up on, or drying her hair.  If she stretches her hand out, symptoms improve.  It does not wake her up from sleeping or occur when typing.  There is no associated weakness or neck pain.    She also complains of constant achy pain over the joints of her fingers and wrist.  She has noticed that the joints have become larger and there is pain to palpation.  She uses lemon grass oil which soothes her hands.   Out-side paper records, electronic medical record, and images have been reviewed where available and summarized as:  Lab Results  Component Value Date   HGBA1C 6.4* 04/20/2014   Lab Results  Component Value Date   TSH 0.92 12/12/2011      Past Medical History  Diagnosis Date  . ALLERGIC RHINITIS   . DEPRESSION   . HYPERLIPIDEMIA   . Personal history of colonic polyps   . SMOKER   . VITAMIN D DEFICIENCY   .  Diabetes mellitus without complication 6295    Past Surgical History  Procedure Laterality Date  . 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 Breast surgery  1990    breast biopsy  . Endometrial ablation  08/21/03    HTA  . Colonoscopy w/ biopsies  9/07    2 polyps & tubuvillous adenoma  . Upper gastrointestinal endoscopy  06/15/06    2 polyps - H pylori  . Colonoscopy w/ biopsies  11/13/08    sigmoid ddiverticuli and rechek in 5 years - Dr. Collene Mares     Medications:  Current Outpatient Prescriptions on File Prior to Visit  Medication Sig Dispense Refill  . Ascorbic Acid (VITAMIN C) 100 MG tablet Take 100 mg by mouth daily.    [name] aspirin 81 MG tablet Take 81 mg by mouth daily.      [name] azelastine (ASTELIN) 0.1 % nasal spray use one spray in each nostril daily as needed. 30 mL 10  . escitalopram (LEXAPRO) 10 MG tablet Take 1 tablet (10 mg total) by mouth daily. 90 tablet 3  . simvastatin (ZOCOR) 20 MG tablet Take one tablet by mouth at bedtime 30 tablet 10   No current facility-administered medications on file prior to visit.    Allergies: No Known Allergies  Family History: Family History  Problem Relation Age of Onset  . Breast cancer  Mother 76  . Diabetes Mother   . Stroke Mother            . Hyperlipidemia Mother   . Hypertension Father   . Heart disease Father   . Hyperlipidemia Father   . Heart failure Father     dec at age 28  . Breast cancer Sister 65    Social History: History   Social History  . Marital Status: Married    Spouse Name: N/A    Number of Children: N/A  . Years of Education: N/A   Occupational History  . Not on file.   Social History Main Topics  . Smoking status: Current Every Day Smoker -- 0.25 packs/day for 30 years    Types: Cigarettes  . Smokeless tobacco: Never Used     Comment: Married, lives with spouse. Cares for elderly parent and in-laws  . Alcohol Use: 0.6 oz/week    1  Not specified per week  . Drug Use: No  . Sexual Activity: Yes    Birth Control/ Protection: Other-see comments     Comment: husband had vasectomy   Other Topics Concern  . Not on file   Social History Narrative   Lives with husband in a one story home.  Has 3 children.  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  Education: high school.      Review of Systems:  CONSTITUTIONAL: No fevers, chills, night sweats, or weight loss.   EYES: No visual changes or eye pain ENT: No hearing changes.  No history of nose bleeds.   RESPIRATORY: No cough, wheezing and shortness of breath.   CARDIOVASCULAR: Negative for chest pain, and palpitations.   GI: Negative for abdominal discomfort, blood in stools or black stools.  No recent change in bowel habits.   GU:  No history of incontinence.   MUSCLOSKELETAL: +history of joint pain or swelling.  No myalgias.   SKIN: Negative for lesions, rash, and itching.   HEMATOLOGY/ONCOLOGY: Negative for prolonged bleeding, bruising easily, and swollen nodes.    ENDOCRINE: Negative for cold or heat intolerance, polydipsia or goiter.   PSYCH:  No depression or anxiety symptoms.   NEURO: As Above.   Vital Signs:  BP 118/70 mmHg  Pulse 86  Ht 5\' 4"  (1.626 m)  Wt 155 lb 8 oz (70.534 kg)  BMI 26.68 kg/m2  SpO2 94%  LMP 03/06/2006   General Medical Exam:   General:  Well appearing, comfortable.   Eyes/ENT: see cranial nerve examination.   Neck: No masses appreciated.  Full range of motion without tenderness.  No carotid bruits. Respiratory:  Clear to auscultation, good air entry bilaterally.   Cardiac:  Regular rate and rhythm, no murmur.   Extremities:  No deformities, edema, or skin discoloration.  Skin:  No rashes or lesions.  Neurological Exam: MENTAL STATUS including orientation to time, place, person, recent and remote memory, attention span and concentration, language, and fund of knowledge is normal.  Speech is not dysarthric.  CRANIAL NERVES: II:  No visual  field defects.  Unremarkable fundi.   III-IV-VI: Pupils equal round and reactive to light.  Normal conjugate, extra-ocular eye movements in all directions of gaze.  No nystagmus.  No ptosis.   V:  Normal facial sensation.   VII:  Normal facial symmetry and movements.  VIII:  Normal hearing and vestibular function.   IX-X:  Normal palatal movement.   XI:  Normal shoulder shrug and head rotation.   XII:  Normal tongue strength  and range of motion, no deviation or fasciculation.  MOTOR:  No atrophy, fasciculations or abnormal movements.  No pronator drift.  Tone is normal.    Right Upper Extremity:    Left Upper Extremity:    Deltoid  5/5   Deltoid  5/5   Biceps  5/5   Biceps  5/5   Triceps  5/5   Triceps  5/5   Wrist extensors  5/5   Wrist extensors  5/5   Wrist flexors  5/5   Wrist flexors  5/5   Finger extensors  5/5   Finger extensors  5/5   Finger flexors  5/5   Finger flexors  5/5   Dorsal interossei  5/5   Dorsal interossei  5/5   Abductor pollicis  5-/5   Abductor pollicis  5-/5   Tone (Ashworth scale)  0  Tone (Ashworth scale)  0   Right Lower Extremity:    Left Lower Extremity:    Hip flexors  5/5   Hip flexors  5/5   Hip extensors  5/5   Hip extensors  5/5   Knee flexors  5/5   Knee flexors  5/5   Knee extensors  5/5   Knee extensors  5/5   Dorsiflexors  5/5   Dorsiflexors  5/5   Plantarflexors  5/5   Plantarflexors  5/5   Toe extensors  5/5   Toe extensors  5/5   Toe flexors  5/5   Toe flexors  5/5   Tone (Ashworth scale)  0  Tone (Ashworth scale)  0   MSRs:  Reflexes are brisk and symmetric throughout (2+/4).  No Hoffman's sign.  Plantars are down-going.  SENSORY:  Normal and symmetric perception of light touch, pinprick, vibration, and proprioception.  Romberg's sign absent.   COORDINATION/GAIT: Normal finger-to- nose-finger and heel-to-shin.  Intact rapid alternating movements bilaterally.  Able to rise from a chair without using arms.  Gait narrow based and stable.  Tandem and stressed gait intact.    IMPRESSION: Christina Buchanan is a 58 year-old female presenting for evaluation of bilateral hand paresthesias and achy pain.  Her neurological exam is non-focal, except for subtle weakness of ABP muscles bilaterally.  Tinel's sign is negative over the wrist and elbow.  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   PLAN/RECOMMENDATIONS:  1.  EMG of the upper extremities for ulnar neuropathy 2.  Start using elbow pad to prevent hyperflexion 3.  Neuralgesic medication offered for tingling paresthesias, but patient wishes not to take anything at this time 4.  Recommended that she follow-up with her PCP for achy hand pain, which seems to have more arthritic quality 5.  Tobacco cessation encouraged 6.  Return to clinic in 82-months.   The duration of this appointment visit was 45 minutes of face-to-face time with the patient.  Greater than 50% of this time was spent in counseling, explanation of diagnosis, planning of further management, and coordination of care.   Thank you for allowing me to participate in patient's care.  If I can answer any additional questions, I would be pleased to do so.    Sincerely,    Razi Hickle K. Posey Pronto, DO

## 2014-10-04 ENCOUNTER — Other Ambulatory Visit: Payer: Self-pay | Admitting: *Deleted

## 2014-10-04 DIAGNOSIS — M79643 Pain in unspecified hand: Secondary | ICD-10-CM

## 2014-10-26 ENCOUNTER — Telehealth: Payer: Self-pay | Admitting: Neurology

## 2014-10-26 ENCOUNTER — Encounter: Payer: BC Managed Care – PPO | Admitting: Neurology

## 2014-10-26 NOTE — Telephone Encounter (Signed)
Pt wants to know what the cost of the EMG is before she has it done. She had to cancel her appt today because she states that we were to call her with the cost and never heard from anyone [phone]

## 2014-10-26 NOTE — Telephone Encounter (Signed)
Noted.  Christina Buchanan, can you let her know the estimated cost of the study - please use CPT 95911 and 95886 x 2 (for two limbs).  Donika K. Posey Pronto, DO

## 2014-10-30 ENCOUNTER — Telehealth: Payer: Self-pay | Admitting: Neurology

## 2014-10-30 NOTE — Telephone Encounter (Signed)
Pt missed 10/26/14 appt w/ Dr. Posey Pronto. Pt says someone was supposed to call her regarding cost. i do remember this conversation but no further details or request was made for me to call this patient. Appt marked as a no show but a letter will not be sent. I will contact the patient today with estimate info / Sherri S.

## 2014-10-31 ENCOUNTER — Telehealth: Payer: Self-pay | Admitting: Neurology

## 2014-10-31 NOTE — Telephone Encounter (Signed)
Based on codes avail. from Dr. Posey Pronto, EMG/NCV will cost approx. 1115.00 (CPT code 22449 = 625.00 and code 75300F1 = 490.00). This is a estimate only!!!   I have left a message for the pt to call me back regarding pricing info. I will await a return call / Sherri S.

## 2014-11-08 NOTE — Telephone Encounter (Signed)
Called patient again today, see previous documentation. LM#2 for pt to call me back / Sherri S

## 2014-11-08 NOTE — Telephone Encounter (Signed)
Spoke w/ pt. Advised EMG study will run around $1115.00. This is what we bill to insurance and this amount will be adjusted to allow for the contract we have with the insurance carrier. Charges on average are reduced around 50% to allow for the contracted amount. Pt verbalizes understanding. She is aware that these are estimates only. She says she will call back to schedule if desired. / Sherri S.

## 2014-11-09 NOTE — Telephone Encounter (Signed)
Noted  

## 2014-11-16 ENCOUNTER — Encounter: Payer: Self-pay | Admitting: Internal Medicine

## 2014-12-22 ENCOUNTER — Other Ambulatory Visit: Payer: Self-pay | Admitting: Nurse Practitioner

## 2014-12-22 NOTE — Telephone Encounter (Signed)
Medication refill request: Vitamin D 50,000 iu's  Last AEX:  04/20/14 with PG Next AEX:  04/23/15 with PG Last Vitamin D level checked: 04/19/13 at 49 Refill authorized: Please advise.

## 2014-12-27 ENCOUNTER — Other Ambulatory Visit: Payer: Self-pay

## 2014-12-27 DIAGNOSIS — Z1231 Encounter for screening mammogram for malignant neoplasm of breast: Secondary | ICD-10-CM

## 2015-01-16 ENCOUNTER — Ambulatory Visit
Admission: RE | Admit: 2015-01-16 | Discharge: 2015-01-16 | Disposition: A | Discharge status: Home / Self Care | Payer: BLUE CROSS/BLUE SHIELD | Source: Ambulatory Visit

## 2015-01-16 DIAGNOSIS — Z1231 Encounter for screening mammogram for malignant neoplasm of breast: Secondary | ICD-10-CM

## 2015-02-20 ENCOUNTER — Other Ambulatory Visit: Payer: Self-pay | Admitting: Internal Medicine

## 2015-03-01 ENCOUNTER — Encounter: Payer: Self-pay | Admitting: Internal Medicine

## 2015-03-24 ENCOUNTER — Other Ambulatory Visit: Payer: Self-pay | Admitting: Nurse Practitioner

## 2015-03-26 NOTE — Telephone Encounter (Signed)
Medication refill request: Vitamin D 50,000 Last AEX:  04-20-14  Next AEX: 04-23-15  Last MMG (if hormonal medication request): 01-16-15 WNL Last Vitamin D level- 04-19-13 -49  Refill authorized: please advise

## 2015-03-31 ENCOUNTER — Other Ambulatory Visit: Payer: Self-pay | Admitting: Internal Medicine

## 2015-04-23 ENCOUNTER — Ambulatory Visit (INDEPENDENT_AMBULATORY_CARE_PROVIDER_SITE_OTHER): Payer: BLUE CROSS/BLUE SHIELD | Admitting: Nurse Practitioner

## 2015-04-23 ENCOUNTER — Encounter: Payer: Self-pay | Admitting: Nurse Practitioner

## 2015-04-23 VITALS — BP 120/70 | HR 68 | Ht 63.75 in | Wt 152.0 lb

## 2015-04-23 DIAGNOSIS — E559 Vitamin D deficiency, unspecified: Secondary | ICD-10-CM

## 2015-04-23 DIAGNOSIS — F418 Other specified anxiety disorders: Secondary | ICD-10-CM | POA: Diagnosis not present

## 2015-04-23 DIAGNOSIS — Z0000 Encounter for general adult medical examination without abnormal findings: Secondary | ICD-10-CM

## 2015-04-23 DIAGNOSIS — N950 Postmenopausal bleeding: Secondary | ICD-10-CM | POA: Diagnosis not present

## 2015-04-23 DIAGNOSIS — Z01419 Encounter for gynecological examination (general) (routine) without abnormal findings: Secondary | ICD-10-CM | POA: Diagnosis not present

## 2015-04-23 DIAGNOSIS — E2839 Other primary ovarian failure: Secondary | ICD-10-CM

## 2015-04-23 LAB — POCT URINALYSIS DIPSTICK
Bilirubin, UA: NEGATIVE
Blood, UA: NEGATIVE
Glucose, UA: 100
Ketones, UA: NEGATIVE
Leukocytes, UA: NEGATIVE
Nitrite, UA: NEGATIVE
Protein, UA: NEGATIVE
Urobilinogen, UA: NEGATIVE
pH, UA: 5

## 2015-04-23 LAB — HEMOGLOBIN, FINGERSTICK: Hemoglobin, fingerstick: 14.4 g/dL (ref 12.0–16.0)

## 2015-04-23 MED ORDER — ESCITALOPRAM OXALATE 10 MG PO TABS: 10.0000 mg | ORAL_TABLET | Freq: Every day | ORAL | Status: DC

## 2015-04-23 MED ORDER — VITAMIN D (ERGOCALCIFEROL) 1.25 MG (50000 UNIT) PO CAPS: 50000.0000 [IU] | ORAL_CAPSULE | ORAL | Status: DC

## 2015-04-23 NOTE — Progress Notes (Signed)
Patient ID: Christina Buchanan, female   DOB: May 20, 1956, 59 y.o.   MRN: [id number] 59 y.o. G3P3 Married  Caucasian Fe here for annual exam.  Mom passed away 2 months ago. During the time of mother's  last month and once after the funeral, patient had 2 episodes of spotting just with wiping and very light.  No associated bladder symptoms, relationship to SA, cramps, or bloating. Last HGB AIC - 3 months ago was elevated. She is scheduled to see PCP in the near future.  She is leaving today to take her 5 granddaughters to the beach for a week.  Patient's last menstrual period was 03/06/2006.          Sexually active: Yes.    The current method of family planning is vasectomy.    Exercising: No.  The patient does not participate in regular exercise at present. Smoker:  Former, quit around 2 months ago  Health Maintenance: Pap: 02/17/2012 Normal with negative HR HPV  MMG: 01/16/15, Bi-Rads 1: negative  Colonoscopy: 11/13/2008 recheck in 5 years, needs to schedule--received letter from [facility] BMD: 2007, per pt.  TDaP: 02/2012  Labs:   HB:  14.4  Urine:  100 Glucose   reports that she quit smoking about 2 months ago. Her smoking use included Cigarettes. She has a 7.5 pack-year smoking history. She has never used smokeless tobacco. She reports that she does not drink alcohol or use illicit drugs.  Past Medical History  Diagnosis Date  . ALLERGIC RHINITIS   . DEPRESSION   . HYPERLIPIDEMIA   . Personal history of colonic polyps   . SMOKER   . VITAMIN D DEFICIENCY   . Diabetes mellitus without complication 0272    Past Surgical History  Procedure Laterality Date  . 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 Breast surgery  1990    breast biopsy  . Endometrial ablation  08/21/03    HTA  . Colonoscopy w/ biopsies  9/07    2 polyps & tubuvillous adenoma  . Upper gastrointestinal endoscopy  06/15/06    2 polyps - H pylori  . Colonoscopy w/  biopsies  11/13/08    sigmoid ddiverticuli and rechek in 5 years - Dr. Collene Mares    Current Outpatient Prescriptions  Medication Sig Dispense Refill  . Ascorbic Acid (VITAMIN C) 100 MG tablet Take 100 mg by mouth daily.    [name] aspirin 81 MG tablet Take 81 mg by mouth daily.      [name] azelastine (ASTELIN) 0.1 % nasal spray use one spray in each nostril daily as needed. 30 mL 10  . escitalopram (LEXAPRO) 10 MG tablet Take 1 tablet (10 mg total) by mouth daily. 90 tablet 3  . simvastatin (ZOCOR) 20 MG tablet Take 1 tablet (20 mg total) by mouth at bedtime. 30 tablet 0  . Vitamin D, Ergocalciferol, (DRISDOL) 50000 UNITS CAPS capsule Take 1 capsule (50,000 Units total) by mouth once a week. 26 capsule 3   No current facility-administered medications for this visit.    Family History  Problem Relation Age of Onset  . Breast cancer Mother 43  . Diabetes Mother   . Stroke Mother            . Hyperlipidemia Mother   . Heart failure Mother   . Hypertension Mother   . Hypertension Father   . Heart disease Father   . Hyperlipidemia Father   . Heart  failure Father     dec at age 34  . Breast cancer Sister 62  . COPD Mother     ROS:  Pertinent items are noted in HPI.  Otherwise, a comprehensive ROS was negative.  Exam:   BP 120/70 mmHg  Pulse 68  Ht 5' 3.75" (1.619 m)  Wt 152 lb (68.947 kg)  BMI 26.30 kg/m2  LMP 03/06/2006 Height: 5' 3.75" (161.9 cm) Ht Readings from Last 3 Encounters:  04/23/15 5' 3.75" (1.619 m)  09/19/14 5\' 4"  (1.626 m)  04/20/14 5\' 4"  (1.626 m)    General appearance: alert, cooperative and appears stated age Head: Normocephalic, without obvious abnormality, atraumatic Neck: no adenopathy, supple, symmetrical, trachea midline and thyroid normal to inspection and palpation Lungs: clear to auscultation bilaterally Breasts: normal appearance, no masses or tenderness Heart: regular rate and rhythm Abdomen: soft, non-tender; no masses,  no organomegaly Extremities:  extremities normal, atraumatic, no cyanosis or edema Skin: Skin color, texture, turgor normal. No rashes or lesions Lymph nodes: Cervical, supraclavicular, and axillary nodes normal. No abnormal inguinal nodes palpated Neurologic: Grossly normal   Pelvic: External genitalia:  no lesions              Urethra:  normal appearing urethra with no masses, tenderness or lesions              Bartholin's and Skene's: normal                 Vagina: normal appearing vagina with normal color and discharge, no lesions              Cervix: anteverted, no polyps seen, no vaginal bleeding.              Pap taken: Yes.   Bimanual Exam:  Uterus:  normal size, contour, position, consistency, mobility, non-tender              Adnexa: no mass, fullness, tenderness normal without pain               Rectovaginal: Confirms               Anus:  normal sphincter tone, no lesions  Chaperone present:  yes  A:  Well Woman with normal exam  S/P HTA 08/21/2003  PMB episode x 2 this past month  History of situational anxiety Postmenopausal no HRT  HTN, DM - uncontrolled, Vit D deficiency, history of colon polyps  P:   Reviewed health and wellness pertinent to exam  Pap smear as above  Mammogram is due 01/2016 order is updated for BMD  Will refill Lexapro for a year since she is doing well on this  Will refill Vit D and follow with labs  Will get apt for PUS/SHGM &  endo biopsy (will consult with Dr. Quincy Simmonds since post HTA)  Counseled on breast self exam, mammography screening, adequate intake of calcium and vitamin D, diet and exercise return annually or prn  An After Visit Summary was printed and given to the patient.

## 2015-04-23 NOTE — Patient Instructions (Signed)

## 2015-04-24 LAB — VITAMIN D 25 HYDROXY (VIT D DEFICIENCY, FRACTURES): Vit D, 25-Hydroxy: 30 ng/mL (ref 30–100)

## 2015-04-25 LAB — IPS PAP TEST WITH HPV

## 2015-04-25 NOTE — Progress Notes (Signed)
Encounter reviewed by Dr. Aundria Rud. Plan for pelvic ultrasound, possible sonohysterogram, and possible EMB.  Hopefully there will not be too much stenosis of the cervix so the evaluation can occur as above for the patient's postmenopausal bleeding.

## 2015-05-04 ENCOUNTER — Other Ambulatory Visit: Payer: Self-pay | Admitting: Internal Medicine

## 2015-05-10 ENCOUNTER — Ambulatory Visit (INDEPENDENT_AMBULATORY_CARE_PROVIDER_SITE_OTHER): Payer: BLUE CROSS/BLUE SHIELD

## 2015-05-10 ENCOUNTER — Ambulatory Visit (INDEPENDENT_AMBULATORY_CARE_PROVIDER_SITE_OTHER): Payer: BLUE CROSS/BLUE SHIELD | Admitting: Obstetrics and Gynecology

## 2015-05-10 ENCOUNTER — Other Ambulatory Visit: Payer: BLUE CROSS/BLUE SHIELD

## 2015-05-10 ENCOUNTER — Other Ambulatory Visit: Payer: Self-pay | Admitting: Obstetrics and Gynecology

## 2015-05-10 ENCOUNTER — Encounter: Payer: Self-pay | Admitting: Obstetrics and Gynecology

## 2015-05-10 ENCOUNTER — Other Ambulatory Visit: Payer: BLUE CROSS/BLUE SHIELD | Admitting: Obstetrics and Gynecology

## 2015-05-10 VITALS — BP 118/78 | HR 78 | Resp 14 | Ht 63.75 in | Wt 154.0 lb

## 2015-05-10 DIAGNOSIS — N950 Postmenopausal bleeding: Secondary | ICD-10-CM

## 2015-05-10 DIAGNOSIS — D259 Leiomyoma of uterus, unspecified: Secondary | ICD-10-CM | POA: Diagnosis not present

## 2015-05-10 NOTE — Progress Notes (Signed)
Subjective  59 y.o. G33P0003  Caucasian female here for pelvic ultrasound for  Recurrent postmenopausal bleeding.  Hx HTA in November, 2004 for menorrhagia - Dr. Joan Flores.   Prior ultrasound 06/30/03 in office prior to HTA.  Uterus with 2.2 x 2.0 cm intramural fundal fibroid.  Question of 2 mm protrusion into endometrial cavity.  EMS then 21.1 mm Right ovary with 2.5 cm echofree cyst with a rim of ovarian tissue inferior to the cyst.  Left ovary with 1.7 cm echofree smoothly marginated cyst. No free fluid.  Sonohysterogram - Question of 2 mm protrusion into endometrial cavity.   No HRT.   Objective  Pelvic ultrasound images and report reviewed with patient.  Uterus - 2.7 cm fundal intramural fibroid.  Cannot rule out encroachment on endometrial canal. EMS - 1.90 mm. Ovaries - normal Free fluid - no        Procedure - sonohysterogram Consent performed. Speculum placed in vagina. Sterile prep of cervix with  Betadine. Cannula placed inside endometrial cavity without difficulty. Speculum removed. Sterile saline injected.       No       filling defect noted but inadequate distention due to scarring from prior ablation. Cannula removed. No complication.   Procedure - endometrial biopsy Consent performed. Speculum place in vagina.  Sterile prep of cervix with  Betadine. Tenaculum to anterior cervical lip. Paracervical block with 10 cc 1% lidocaine _______________ no Pipelle placed to    Almost 7      cm without difficulty  once Small amount of tissue obtained and sent to pathology. Speculum removed.  No complications. Minimal EBL.  Assessment  Recurrent postmenopausal bleeding.  Hx HTA.  Fundal fibroid with probable encroachment on endometrial canal.  Atrophic appearing endometrium.   Plan  Discussion of fibroids.  Discussion of postmenopausal bleeding.  Follow up endometrial biopsy.  Instructions and precautions given.  Call for any recurrent postmenopausal  bleeding.   __25_____ minutes face to face time of which over 50% was spent in counseling.   After visit summary to patient.

## 2015-05-10 NOTE — Patient Instructions (Signed)
Endometrial Biopsy, Care After Refer to this sheet in the next few weeks. These instructions provide you with information on caring for yourself after your procedure. Your health care provider may also give you more specific instructions. Your treatment has been planned according to current medical practices, but problems sometimes occur. Call your health care provider if you have any problems or questions after your procedure. WHAT TO EXPECT AFTER THE PROCEDURE After your procedure, it is typical to have the following:  You may have mild cramping and a small amount of vaginal bleeding for a few days after the procedure. This is normal. HOME CARE INSTRUCTIONS  Only take over-the-counter or prescription medicine as directed by your health care provider.  Do not douche, use tampons, or have sexual intercourse until your health care provider approves.  Follow your health care provider's instructions regarding any activity restrictions, such as strenuous exercise or heavy lifting. SEEK MEDICAL CARE IF:  You have heavy bleeding or bleeding longer than 2 days after the procedure.  You have bad smelling drainage from your vagina.  You have a fever and chills.  Youhave severe lower stomach (abdominal) pain. SEEK IMMEDIATE MEDICAL CARE IF:  You have severe cramps in your stomach or back.  You pass large blood clots.  Your bleeding increases.  You become weak or lightheaded, or you pass out. Document Released: 07/13/2013 Document Reviewed: 07/13/2013 Exit[employer] Patient Information 2015 [employer]. This information is not intended to replace advice given to you by your health care provider. Make sure you discuss any questions you have with your health care provider.  

## 2015-05-11 ENCOUNTER — Ambulatory Visit: Payer: BLUE CROSS/BLUE SHIELD | Admitting: Obstetrics and Gynecology

## 2015-05-14 LAB — IPS OTHER TISSUE BIOPSY

## 2015-05-21 ENCOUNTER — Telehealth: Payer: Self-pay | Admitting: Emergency Medicine

## 2015-05-21 NOTE — Telephone Encounter (Signed)
-----   Message from Nunzio Cobbs, MD sent at 05/16/2015  9:15 PM EDT ----- Please let patient know of her endometrial biopsy showing endocervical glands and no endometrial tissue.   This indicates the Pipelle did not reach far into the endometrial cavity - due to her ablation procedure.  The lining of the endometrium was thin, and I believe her bleeding is due to the fibroid encroaching on the endometrium.  For now, it is OK to do observational management, but needs to call for any recurrent vaginal bleeding or spotting.   Cc- Marisa Sprinkles

## 2015-05-21 NOTE — Telephone Encounter (Signed)
Patient returned call. She is given message from Dr. Quincy Simmonds.  She verbalizes understanding of results as written from Dr. Quincy Simmonds and verbalized understanding of plan of care going forward. She will call back if any bleeding or spotting. Routing to provider for final review. Patient agreeable to disposition. Will close encounter.

## 2015-05-21 NOTE — Telephone Encounter (Signed)
Message left to return call to Esa Raden at [phone].    

## 2015-06-14 ENCOUNTER — Other Ambulatory Visit: Payer: Self-pay | Admitting: Internal Medicine

## 2015-06-15 MED ORDER — SIMVASTATIN 20 MG PO TABS: 20.0000 mg | ORAL_TABLET | Freq: Every day | ORAL | Status: DC

## 2015-07-02 ENCOUNTER — Other Ambulatory Visit: Payer: Self-pay | Admitting: Nurse Practitioner

## 2015-07-10 ENCOUNTER — Other Ambulatory Visit: Payer: Self-pay | Admitting: Nurse Practitioner

## 2015-07-10 ENCOUNTER — Other Ambulatory Visit: Payer: Self-pay | Admitting: Internal Medicine

## 2015-07-10 DIAGNOSIS — E785 Hyperlipidemia, unspecified: Secondary | ICD-10-CM

## 2015-07-10 NOTE — Telephone Encounter (Signed)
Patient was given #90/3RF at [facility] on 04/23/15.//kn

## 2015-07-11 NOTE — Telephone Encounter (Signed)
90 d refill done - Please schedule for CPE if not already done (last visit 2013??) Between now and then please come in for fasting labs as lipids not checked since 2013 (lab order entered - thanks) thanks

## 2015-07-16 ENCOUNTER — Encounter: Payer: Self-pay | Admitting: Internal Medicine

## 2015-08-14 ENCOUNTER — Encounter: Payer: Self-pay | Admitting: Internal Medicine

## 2015-10-03 LAB — HEPATIC FUNCTION PANEL
ALT: 33 U/L (ref 7–35)
AST: 25 U/L (ref 13–35)
Alkaline Phosphatase: 73 U/L (ref 25–125)
Bilirubin, Total: 0.4 mg/dL

## 2015-10-03 LAB — CBC AND DIFFERENTIAL
HCT: 42 % (ref 36–46)
Hemoglobin: 14.7 g/dL (ref 12.0–16.0)
Neutrophils Absolute: 62 /uL
Platelets: 300 10*3/uL (ref 150–399)
WBC: 6.8 10^3/mL

## 2015-10-03 LAB — LIPID PANEL
Cholesterol: 158 mg/dL (ref 0–200)
HDL: 48 mg/dL (ref 35–70)
LDL Cholesterol: 30 mg/dL
LDl/HDL Ratio: 3.3
Triglycerides: 151 mg/dL (ref 40–160)

## 2015-10-03 LAB — BASIC METABOLIC PANEL
Creatinine: 0.6 mg/dL (ref 0.5–1.1)
Glucose: 147 mg/dL
Potassium: 4.6 mmol/L (ref 3.4–5.3)
Sodium: 141 mmol/L (ref 137–147)

## 2015-10-03 LAB — TSH: TSH: 0.83 u[IU]/mL (ref 0.41–5.90)

## 2015-10-03 LAB — HEMOGLOBIN A1C: Hemoglobin A1C: 6.2

## 2015-10-04 ENCOUNTER — Encounter: Payer: Self-pay | Admitting: Internal Medicine

## 2015-10-04 ENCOUNTER — Ambulatory Visit (INDEPENDENT_AMBULATORY_CARE_PROVIDER_SITE_OTHER): Payer: BLUE CROSS/BLUE SHIELD | Admitting: Internal Medicine

## 2015-10-04 ENCOUNTER — Other Ambulatory Visit (INDEPENDENT_AMBULATORY_CARE_PROVIDER_SITE_OTHER): Payer: BLUE CROSS/BLUE SHIELD

## 2015-10-04 VITALS — BP 132/88 | HR 69 | Temp 98.1°F | Resp 16 | Wt 155.0 lb

## 2015-10-04 DIAGNOSIS — M2550 Pain in unspecified joint: Secondary | ICD-10-CM

## 2015-10-04 DIAGNOSIS — E785 Hyperlipidemia, unspecified: Secondary | ICD-10-CM | POA: Diagnosis not present

## 2015-10-04 DIAGNOSIS — F32A Depression, unspecified: Secondary | ICD-10-CM

## 2015-10-04 DIAGNOSIS — F329 Major depressive disorder, single episode, unspecified: Secondary | ICD-10-CM

## 2015-10-04 DIAGNOSIS — Z0000 Encounter for general adult medical examination without abnormal findings: Secondary | ICD-10-CM

## 2015-10-04 DIAGNOSIS — E559 Vitamin D deficiency, unspecified: Secondary | ICD-10-CM

## 2015-10-04 DIAGNOSIS — F17200 Nicotine dependence, unspecified, uncomplicated: Secondary | ICD-10-CM

## 2015-10-04 DIAGNOSIS — R7303 Prediabetes: Secondary | ICD-10-CM | POA: Insufficient documentation

## 2015-10-04 LAB — COMPREHENSIVE METABOLIC PANEL
ALT: 30 U/L (ref 0–35)
AST: 21 U/L (ref 0–37)
Albumin: 4.4 g/dL (ref 3.5–5.2)
Alkaline Phosphatase: 67 U/L (ref 39–117)
BUN: 14 mg/dL (ref 6–23)
CO2: 29 mEq/L (ref 19–32)
Calcium: 9.4 mg/dL (ref 8.4–10.5)
Chloride: 105 mEq/L (ref 96–112)
Creatinine, Ser: 0.62 mg/dL (ref 0.40–1.20)
GFR: 104.12 mL/min (ref >60.00)
Glucose, Bld: 143 mg/dL — ABNORMAL HIGH (ref 70–99)
Potassium: 3.8 mEq/L (ref 3.5–5.1)
Sodium: 141 mEq/L (ref 135–145)
Total Bilirubin: 0.5 mg/dL (ref 0.2–1.2)
Total Protein: 7.5 g/dL (ref 6.0–8.3)

## 2015-10-04 LAB — LIPID PANEL
Cholesterol: 152 mg/dL (ref 0–200)
HDL: 47.1 mg/dL (ref >39.00)
LDL Cholesterol: 76 mg/dL (ref 0–99)
NonHDL: 104.51
Total CHOL/HDL Ratio: 3
Triglycerides: 144 mg/dL (ref 0.0–149.0)
VLDL: 28.8 mg/dL (ref 0.0–40.0)

## 2015-10-04 LAB — RHEUMATOID FACTOR: Rhuematoid fact SerPl-aCnc: <10 IU/mL (ref <=14)

## 2015-10-04 NOTE — Progress Notes (Signed)
Subjective:    Patient ID: Christina Buchanan, female    DOB: [birth year], 59 y.o.   MRN: [id number]  HPI She is here to establish with a new pcp.  She is for a physical.  She just saw a holistic doctor recently and had blood work done.  She has bad arthritis.  The arthritis is worse in the hands.  No other joints bother her.  No family history of arthritis.  The joints feel swollen.  She has morning stiffness, which can last all day.    She has no concerns.    Medications and allergies reviewed with patient and not updated.  Patient Active Problem List   Diagnosis Date Noted  . Elevated LFTs 12/12/2011  . VITAMIN D DEFICIENCY 01/29/2010  . HYPERLIPIDEMIA 01/21/2010  . SMOKER 01/21/2010  . DEPRESSION 01/21/2010  . ALLERGIC RHINITIS 01/21/2010  . Personal history of colonic polyps 07/30/2006    Current Outpatient Prescriptions on File Prior to Visit  Medication Sig Dispense Refill  . Ascorbic Acid (VITAMIN C) 100 MG tablet Take 100 mg by mouth daily.    [name] aspirin 81 MG tablet Take 81 mg by mouth daily.      [name] azelastine (ASTELIN) 0.1 % nasal spray use one spray in each nostril daily as needed. 30 mL 10  . escitalopram (LEXAPRO) 10 MG tablet Take 1 tablet (10 mg total) by mouth daily. 90 tablet 3  . simvastatin (ZOCOR) 20 MG tablet Take 1 tablet (20 mg total) by mouth daily at 6 PM. 90 tablet 0  . Vitamin D, Ergocalciferol, (DRISDOL) 50000 UNITS CAPS capsule Take 1 capsule (50,000 Units total) by mouth once a week. 26 capsule 3   No current facility-administered medications on file prior to visit.    Past Medical History  Diagnosis Date  . ALLERGIC RHINITIS   . DEPRESSION   . HYPERLIPIDEMIA   . Personal history of colonic polyps   . SMOKER   . VITAMIN D DEFICIENCY   . Diabetes mellitus without complication ([place]) 123456    Past Surgical History  Procedure Laterality Date  . 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 Breast surgery  1990    breast biopsy  . Endometrial ablation  08/21/03    HTA  . Colonoscopy w/ biopsies  9/07    2 polyps & tubuvillous adenoma  . Upper gastrointestinal endoscopy  06/15/06    2 polyps - H pylori  . Colonoscopy w/ biopsies  11/13/08    sigmoid ddiverticuli and rechek in 5 years - Dr. Collene Mares    Social History   Social History  . Marital Status: Married    Spouse Name: N/A  . Number of Children: N/A  . Years of Education: N/A   Social History Main Topics  . Smoking status: Former Smoker -- 0.25 packs/day for 30 years    Types: Cigarettes    Quit date: 02/04/2015  . Smokeless tobacco: Never Used     Comment: Married, lives with spouse. Cares for elderly parent and in-laws  . Alcohol Use: No     Comment: Rare glass of wine  . Drug Use: No  . Sexual Activity:    Partners: Male    Birth Control/ Protection: Other-see comments     Comment: husband had vasectomy   Other Topics Concern  . None   Social History Narrative   Lives with husband in a one story  home.  She has 3 grown children.     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     Education: high school.      Review of Systems  Constitutional: Negative for fever, chills and fatigue.  Eyes: Negative for visual disturbance.  Respiratory: Negative for cough, shortness of breath and wheezing.   Cardiovascular: Negative for chest pain, palpitations and leg swelling.  Gastrointestinal: Positive for diarrhea. Negative for nausea, abdominal pain and blood in stool.       Urgency with BM, no GERD  Genitourinary: Negative for urgency and frequency.  Musculoskeletal: Positive for arthralgias. Negative for myalgias and back pain.  Neurological: Negative for dizziness, light-headedness and headaches.  Psychiatric/Behavioral: Negative for sleep disturbance and dysphoric mood. The patient is not nervous/anxious.        Objective:   Filed Vitals:   10/04/15 1009  BP: 132/88  Pulse: 69  Temp: 98.1 F (36.7 C)  Resp:  16   Filed Weights   10/04/15 1009  Weight: 155 lb (70.308 kg)   Body mass index is 26.82 kg/(m^2).   Physical Exam Constitutional: She appears well-developed and well-nourished. No distress.  HENT:  Head: Normocephalic and atraumatic.  Right Ear: External ear normal.  Left Ear: External ear normal.  Mouth/Throat: Oropharynx is clear and moist.  Normal bilateral ear canals and tympanic membranes  Eyes: Conjunctivae and EOM are normal.  Neck: Neck supple. No tracheal deviation present. No thyromegaly present.  No carotid bruit  Cardiovascular: Normal rate, regular rhythm and normal heart sounds.   No murmur heard. Pulmonary/Chest: Effort normal and breath sounds normal. No respiratory distress. She has no wheezes. She has no rales.  Abdominal: Soft. She exhibits no distension. There is no tenderness.  Musculoskeletal: She exhibits no edema.  Lymphadenopathy:    She has no cervical adenopathy.  Skin: Skin is warm and dry. She is not diaphoretic.  Psychiatric: She has a normal mood and affect. Her behavior is normal.       Assessment & Plan:   Physical exam: Screening blood work -some was done yesterday but the holistic doctor and I will order some additional blood work today Immunizations - declined tetanus. Discussed shingles vaccine-deferred Colonoscopy up-to-date Mammogram up-to-date Eye exams up-to-date EKG deferred today, last EKG reviewed and was normal Exercise-currently not exercising regularly, but plans on starting. Stressed the importance of regular exercise Weight-would like to lose some weight and will work on it Skin -no concerns Substance abuse - she is still smoking-she states she smokes on and off-only 3 cigarettes a night. She knows she needs to quit  Joint pain Hand pain with some swelling and am stiffness Will check ANA and RF  Diarrhea, urgency with bowel movements No abdominal cramping Colonoscopy up-to-date and normal except for polyps Symptoms  were worse after returning from the [place] Recommended a trial of probiotics-if no change or improvement after 4 weeks can discontinue Consider adding a fiber supplement such as Konsyl fiber intake daily and adjust if needed  Recently saw a holistic doctor and had blood work done-she will have blood work transferred to me to review   See problem list for assessment and plan of chronic medical conditions  Follow-up annually

## 2015-10-04 NOTE — Patient Instructions (Signed)
Try taking a probiotic - if there is no improvement for 4 weeks it will likely not help.  Try taking Konsyl fiber supplement on a daily basis to see if that helps bulk up the stool.   Test(s) ordered today. Your results will be released to [facility] (or called to you) after review, usually within 72hours after test completion. If any changes need to be made, you will be notified at that same time.  All other Health Maintenance issues reviewed.   All recommended immunizations and age-appropriate screenings are up-to-date.  No immunizations administered today.   Medications reviewed and updated.  No changes recommended at this time.    Health Maintenance, Female Adopting a healthy lifestyle and getting preventive care can go a long way to promote health and wellness. Talk with your health care provider about what schedule of regular examinations is right for you. This is a good chance for you to check in with your provider about disease prevention and staying healthy. In between checkups, there are plenty of things you can do on your own. Experts have done a lot of research about which lifestyle changes and preventive measures are most likely to keep you healthy. Ask your health care provider for more information. WEIGHT AND DIET  Eat a healthy diet  Be sure to include plenty of vegetables, fruits, low-fat dairy products, and lean protein.  Do not eat a lot of foods high in solid fats, added sugars, or salt.  Get regular exercise. This is one of the most important things you can do for your health.  Most adults should exercise for at least 150 minutes each week. The exercise should increase your heart rate and make you sweat (moderate-intensity exercise).  Most adults should also do strengthening exercises at least twice a week. This is in addition to the moderate-intensity exercise.  Maintain a healthy weight  Body mass index (BMI) is a measurement that can be used to identify possible  weight problems. It estimates body fat based on height and weight. Your health care provider can help determine your BMI and help you achieve or maintain a healthy weight.  For females 30 years of age and older:   A BMI below 18.5 is considered underweight.  A BMI of 18.5 to 24.9 is normal.  A BMI of 25 to 29.9 is considered overweight.  A BMI of 30 and above is considered obese.  Watch levels of cholesterol and blood lipids  You should start having your blood tested for lipids and cholesterol at 60 years of age, then have this test every 5 years.  You may need to have your cholesterol levels checked more often if:  Your lipid or cholesterol levels are high.  You are older than 60 years of age.  You are at high risk for heart disease.  CANCER SCREENING   Lung Cancer  Lung cancer screening is recommended for adults 81-68 years old who are at high risk for lung cancer because of a history of smoking.  A yearly low-dose CT scan of the lungs is recommended for people who:  Currently smoke.  Have quit within the past 15 years.  Have at least a 30-pack-year history of smoking. A pack year is smoking an average of one pack of cigarettes a day for 1 year.  Yearly screening should continue until it has been 15 years since you quit.  Yearly screening should stop if you develop a health problem that would prevent you from having lung cancer  treatment.  Breast Cancer  Practice breast self-awareness. This means understanding how your breasts normally appear and feel.  It also means doing regular breast self-exams. Let your health care provider know about any changes, no matter how small.  If you are in your 20s or 30s, you should have a clinical breast exam (CBE) by a health care provider every 1-3 years as part of a regular health exam.  If you are 18 or older, have a CBE every year. Also consider having a breast X-ray (mammogram) every year.  If you have a family history of  breast cancer, talk to your health care provider about genetic screening.  If you are at high risk for breast cancer, talk to your health care provider about having an MRI and a mammogram every year.  Breast cancer gene (BRCA) assessment is recommended for women who have family members with BRCA-related cancers. BRCA-related cancers include:  Breast.  Ovarian.  Tubal.  Peritoneal cancers.  Results of the assessment will determine the need for genetic counseling and BRCA1 and BRCA2 testing. Cervical Cancer Your health care provider may recommend that you be screened regularly for cancer of the pelvic organs (ovaries, uterus, and vagina). This screening involves a pelvic examination, including checking for microscopic changes to the surface of your cervix (Pap test). You may be encouraged to have this screening done every 3 years, beginning at age 9.  For women ages 76-65, health care providers may recommend pelvic exams and Pap testing every 3 years, or they may recommend the Pap and pelvic exam, combined with testing for human papilloma virus (HPV), every 5 years. Some types of HPV increase your risk of cervical cancer. Testing for HPV may also be done on women of any age with unclear Pap test results.  Other health care providers may not recommend any screening for nonpregnant women who are considered low risk for pelvic cancer and who do not have symptoms. Ask your health care provider if a screening pelvic exam is right for you.  If you have had past treatment for cervical cancer or a condition that could lead to cancer, you need Pap tests and screening for cancer for at least 20 years after your treatment. If Pap tests have been discontinued, your risk factors (such as having a new sexual partner) need to be reassessed to determine if screening should resume. Some women have medical problems that increase the chance of getting cervical cancer. In these cases, your health care provider may  recommend more frequent screening and Pap tests. Colorectal Cancer  This type of cancer can be detected and often prevented.  Routine colorectal cancer screening usually begins at 60 years of age and continues through 60 years of age.  Your health care provider may recommend screening at an earlier age if you have risk factors for colon cancer.  Your health care provider may also recommend using home test kits to check for hidden blood in the stool.  A small camera at the end of a tube can be used to examine your colon directly (sigmoidoscopy or colonoscopy). This is done to check for the earliest forms of colorectal cancer.  Routine screening usually begins at age 15.  Direct examination of the colon should be repeated every 5-10 years through 60 years of age. However, you may need to be screened more often if early forms of precancerous polyps or small growths are found. Skin Cancer  Check your skin from head to toe regularly.  Tell your  health care provider about any new moles or changes in moles, especially if there is a change in a mole's shape or color.  Also tell your health care provider if you have a mole that is larger than the size of a pencil eraser.  Always use sunscreen. Apply sunscreen liberally and repeatedly throughout the day.  Protect yourself by wearing long sleeves, pants, a wide-brimmed hat, and sunglasses whenever you are outside. HEART DISEASE, DIABETES, AND HIGH BLOOD PRESSURE   High blood pressure causes heart disease and increases the risk of stroke. High blood pressure is more likely to develop in:  People who have blood pressure in the high end of the normal range (130-139/85-89 mm Hg).  People who are overweight or obese.  People who are African American.  If you are 82-16 years of age, have your blood pressure checked every 3-5 years. If you are 11 years of age or older, have your blood pressure checked every year. You should have your blood  pressure measured twice--once when you are at a hospital or clinic, and once when you are not at a hospital or clinic. Record the average of the two measurements. To check your blood pressure when you are not at a hospital or clinic, you can use:  An automated blood pressure machine at a pharmacy.  A home blood pressure monitor.  If you are between 70 years and 19 years old, ask your health care provider if you should take aspirin to prevent strokes.  Have regular diabetes screenings. This involves taking a blood sample to check your fasting blood sugar level.  If you are at a normal weight and have a low risk for diabetes, have this test once every three years after 60 years of age.  If you are overweight and have a high risk for diabetes, consider being tested at a younger age or more often. PREVENTING INFECTION  Hepatitis B  If you have a higher risk for hepatitis B, you should be screened for this virus. You are considered at high risk for hepatitis B if:  You were born in a country where hepatitis B is common. Ask your health care provider which countries are considered high risk.  Your parents were born in a high-risk country, and you have not been immunized against hepatitis B (hepatitis B vaccine).  You have HIV or AIDS.  You use needles to inject street drugs.  You live with someone who has hepatitis B.  You have had sex with someone who has hepatitis B.  You get hemodialysis treatment.  You take certain medicines for conditions, including cancer, organ transplantation, and autoimmune conditions. Hepatitis C  Blood testing is recommended for:  Everyone born from [birth year] through 1966.  Anyone with known risk factors for hepatitis C. Sexually transmitted infections (STIs)  You should be screened for sexually transmitted infections (STIs) including gonorrhea and chlamydia if:  You are sexually active and are younger than 60 years of age.  You are older than 60 years  of age and your health care provider tells you that you are at risk for this type of infection.  Your sexual activity has changed since you were last screened and you are at an increased risk for chlamydia or gonorrhea. Ask your health care provider if you are at risk.  If you do not have HIV, but are at risk, it may be recommended that you take a prescription medicine daily to prevent HIV infection. This is called pre-exposure prophylaxis (PrEP).  You are considered at risk if:  You are sexually active and do not regularly use condoms or know the HIV status of your partner(s).  You take drugs by injection.  You are sexually active with a partner who has HIV. Talk with your health care provider about whether you are at high risk of being infected with HIV. If you choose to begin PrEP, you should first be tested for HIV. You should then be tested every 3 months for as long as you are taking PrEP.  PREGNANCY   If you are premenopausal and you may become pregnant, ask your health care provider about preconception counseling.  If you may become pregnant, take 400 to 800 micrograms (mcg) of folic acid every day.  If you want to prevent pregnancy, talk to your health care provider about birth control (contraception). OSTEOPOROSIS AND MENOPAUSE   Osteoporosis is a disease in which the bones lose minerals and strength with aging. This can result in serious bone fractures. Your risk for osteoporosis can be identified using a bone density scan.  If you are 73 years of age or older, or if you are at risk for osteoporosis and fractures, ask your health care provider if you should be screened.  Ask your health care provider whether you should take a calcium or vitamin D supplement to lower your risk for osteoporosis.  Menopause may have certain physical symptoms and risks.  Hormone replacement therapy may reduce some of these symptoms and risks. Talk to your health care provider about whether hormone  replacement therapy is right for you.  HOME CARE INSTRUCTIONS   Schedule regular health, dental, and eye exams.  Stay current with your immunizations.   Do not use any tobacco products including cigarettes, chewing tobacco, or electronic cigarettes.  If you are pregnant, do not drink alcohol.  If you are breastfeeding, limit how much and how often you drink alcohol.  Limit alcohol intake to no more than 1 drink per day for nonpregnant women. One drink equals 12 ounces of beer, 5 ounces of wine, or 1 ounces of hard liquor.  Do not use street drugs.  Do not share needles.  Ask your health care provider for help if you need support or information about quitting drugs.  Tell your health care provider if you often feel depressed.  Tell your health care provider if you have ever been abused or do not feel safe at home.   This information is not intended to replace advice given to you by your health care provider. Make sure you discuss any questions you have with your health care provider.   Document Released: 04/07/2011 Document Revised: 10/13/2014 Document Reviewed: 08/24/2013 Elsevier Interactive Patient Education [employer].

## 2015-10-04 NOTE — Progress Notes (Signed)
Pre visit review using our clinic review tool, if applicable. No additional management support is needed unless otherwise documented below in the visit note. 

## 2015-10-04 NOTE — Assessment & Plan Note (Signed)
Smokes 3 cigarettes at night and has quit on and off the past. Stressed smoking cessation

## 2015-10-04 NOTE — Assessment & Plan Note (Signed)
Recommended discontinuing high-dose vitamin D weekly and taking 2000 units daily

## 2015-10-04 NOTE — Assessment & Plan Note (Signed)
Controlled, stable Continue current dose of Lexapro

## 2015-10-04 NOTE — Assessment & Plan Note (Signed)
On simvastatin 20 mg daily Recheck lipid panel Encouraged regular exercise

## 2015-10-05 ENCOUNTER — Telehealth: Payer: Self-pay | Admitting: Emergency Medicine

## 2015-10-05 LAB — ANA: Anti Nuclear Antibody(ANA): NEGATIVE

## 2015-10-05 LAB — HEPATITIS C ANTIBODY: HCV Ab: NEGATIVE

## 2015-10-05 NOTE — Telephone Encounter (Signed)
Lab called to inform that they are unable to do a Hgb A1c because the tube needed for that test was not drawn. Do you want pt to come back in to have Hgb A1c checked?

## 2015-10-05 NOTE — Telephone Encounter (Signed)
We can hold off for now.

## 2015-10-07 ENCOUNTER — Encounter: Payer: Self-pay | Admitting: Internal Medicine

## 2015-10-14 ENCOUNTER — Encounter: Payer: Self-pay | Admitting: Internal Medicine

## 2015-10-17 ENCOUNTER — Encounter: Payer: Self-pay | Admitting: Internal Medicine

## 2015-10-20 ENCOUNTER — Encounter: Payer: Self-pay | Admitting: Internal Medicine

## 2015-10-27 ENCOUNTER — Other Ambulatory Visit: Payer: Self-pay | Admitting: Internal Medicine

## 2015-10-28 ENCOUNTER — Encounter: Payer: Self-pay | Admitting: Internal Medicine

## 2015-11-12 ENCOUNTER — Encounter: Payer: Self-pay | Admitting: Internal Medicine

## 2016-02-08 ENCOUNTER — Other Ambulatory Visit: Payer: Self-pay

## 2016-02-08 DIAGNOSIS — Z1231 Encounter for screening mammogram for malignant neoplasm of breast: Secondary | ICD-10-CM

## 2016-02-27 ENCOUNTER — Ambulatory Visit
Admission: RE | Admit: 2016-02-27 | Discharge: 2016-02-27 | Disposition: A | Discharge status: Home / Self Care | Payer: BLUE CROSS/BLUE SHIELD | Source: Ambulatory Visit

## 2016-02-27 DIAGNOSIS — Z1231 Encounter for screening mammogram for malignant neoplasm of breast: Secondary | ICD-10-CM

## 2016-03-04 ENCOUNTER — Other Ambulatory Visit: Payer: Self-pay | Admitting: *Deleted

## 2016-03-04 MED ORDER — AZELASTINE HCL 0.1 % NA SOLN: NASAL | Status: DC

## 2016-04-28 ENCOUNTER — Encounter: Payer: Self-pay | Admitting: Nurse Practitioner

## 2016-04-28 NOTE — Progress Notes (Signed)
Patient ID: Christina Buchanan, female   DOB: 1956/06/10, 60 y.o.   MRN: [id number]  60 y.o. [id number] Married  Caucasian Fe here for annual exam.  Patient states has spotted 2 more times since last visit. First episode 11/2015 with spotting that was a light pink stain and gone after cleaning.  Last spotting seen 03/31/16.   She noted it was bright red and more than the last time.  Went away the same day. No history of heavy lifting or pushing prior to bleeding.  She had PUS, EMB last August 2016 by Dr. Quincy Simmonds.  She was to report any further bleeding but does not want to have EMB secondary to having previous HTA this was very painful.  Patient's last menstrual period was 03/06/2006.          Sexually active: Yes.    The current method of family planning is vasectomy.    Exercising: No.  The patient does not participate in regular exercise at present. Smoker:  yes  Health Maintenance: Pap: 04/23/15, Negative with neg HR HPV MMG: 02/27/16, Bi-Rads 1: negative  Colonoscopy: 09/24/15, Tubular Adenoma, repeat in 5 years BMD: 10/20/06 Z-Score: +0.4 Spine / -0.4 Left Femur Neck TDaP: 02/17/12 Hep C: 10/04/15 HIV: will do today Labs: 10/04/15 in EPIC   reports that she has been smoking Cigarettes.  She has a 7.50 pack-year smoking history. She has never used smokeless tobacco. She reports that she does not drink alcohol or use drugs.  Past Medical History:  Diagnosis Date  . ALLERGIC RHINITIS   . DEPRESSION   . Diabetes mellitus without complication ([place]) 123456  . HYPERLIPIDEMIA   . Personal history of colonic polyps   . SMOKER   . VITAMIN D DEFICIENCY     Past Surgical History:  Procedure Laterality Date  . BREAST SURGERY  1990   breast biopsy  . COLONOSCOPY W/ BIOPSIES  9/07   2 polyps & tubuvillous adenoma  . COLONOSCOPY W/ BIOPSIES  11/13/08   sigmoid ddiverticuli and rechek in 5 years - Dr. Collene Mares  . ENDOMETRIAL ABLATION  08/21/03   HTA  . 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 UPPER GASTROINTESTINAL ENDOSCOPY  06/15/06   2 polyps - H pylori    Current Outpatient Prescriptions  Medication Sig Dispense Refill  . Ascorbic Acid (VITAMIN C) 100 MG tablet Take 100 mg by mouth daily.    [name] aspirin 81 MG tablet Take 81 mg by mouth daily.      [name] azelastine (ASTELIN) 0.1 % nasal spray USE ONE SPRAY IN EACH NOSTRIL DAILY AS NEEDED 90 mL 0  . escitalopram (LEXAPRO) 10 MG tablet Take 1 tablet (10 mg total) by mouth daily. 90 tablet 3  . simvastatin (ZOCOR) 20 MG tablet TAKE 1 TABLET (20 MG TOTAL) BY MOUTH DAILY AT 6 PM. 90 tablet 2  . Vitamin D, Ergocalciferol, (DRISDOL) 50000 UNITS CAPS capsule Take 1 capsule (50,000 Units total) by mouth once a week. 26 capsule 3   No current facility-administered medications for this visit.     Family History  Problem Relation Age of Onset  . Breast cancer Mother 75  . Diabetes Mother   . Stroke Mother            . Hyperlipidemia Mother   . Heart failure Mother   . Hypertension Mother   . COPD Mother   . Hypertension Father   . Heart disease Father   .  Hyperlipidemia Father   . Heart failure Father     dec at age 25  . Breast cancer Sister 39  . Psoriasis Son     ROS:  Pertinent items are noted in HPI.  Otherwise, a comprehensive ROS was negative.  Exam:   BP 110/60 (BP Location: Right Arm, Patient Position: Sitting, Cuff Size: Normal)   Pulse 72   Resp 14   Ht 5' 3.75" (1.619 m)   Wt 155 lb (70.3 kg)   LMP 03/06/2006   BMI 26.81 kg/m  Height: 5' 3.75" (161.9 cm) Ht Readings from Last 3 Encounters:  04/29/16 5' 3.75" (1.619 m)  05/10/15 5' 3.75" (1.619 m)  04/23/15 5' 3.75" (1.619 m)    General appearance: alert, cooperative and appears stated age Head: Normocephalic, without obvious abnormality, atraumatic Neck: no adenopathy, supple, symmetrical, trachea midline and thyroid normal to inspection and palpation Lungs: clear to auscultation bilaterally Breasts:  normal appearance, no masses or tenderness Heart: regular rate and rhythm Abdomen: soft, non-tender; no masses,  no organomegaly Extremities: extremities normal, atraumatic, no cyanosis or edema Skin: Skin color, texture, turgor normal. No rashes or lesions Lymph nodes: Cervical, supraclavicular, and axillary nodes normal. No abnormal inguinal nodes palpated Neurologic: Grossly normal   Pelvic: External genitalia:  no lesions              Urethra:  normal appearing urethra with no masses, tenderness or lesions              Bartholin's and Skene's: normal                 Vagina: normal appearing vagina with normal color and discharge, no lesions              Cervix: anteverted              Pap taken: No. Bimanual Exam:  Uterus:  normal size, contour, position, consistency, mobility, non-tender              Adnexa: no mass, fullness, tenderness               Rectovaginal: Confirms               Anus:  normal sphincter tone, no lesions  Chaperone present: yes  A:  Well Woman with normal exam              S/P HTA 08/21/2003              PMB episode x 2 since last evaluation 05/2015              History of situational anxiety   Postmenopausal no HRT               HTN, DM - uncontrolled, Vit D deficiency, history of colon polyps    Glycosuria    P:   Reviewed health and wellness pertinent to exam  Pap smear as above  Mammogram is due 5 /2018  Refill on Lexapro 10 mg daily X 1 yr.  Will follow with labs - currently on Vit D OTC 5000 IU daily  She is aware that Dr. Quincy Simmonds will review her chart and make recommendations  Counseled on breast self exam, mammography screening, adequate intake of calcium and vitamin D, diet and exercise return annually or prn  An After Visit Summary was printed and given to the patient.

## 2016-04-29 ENCOUNTER — Ambulatory Visit (INDEPENDENT_AMBULATORY_CARE_PROVIDER_SITE_OTHER): Payer: BLUE CROSS/BLUE SHIELD | Admitting: Nurse Practitioner

## 2016-04-29 ENCOUNTER — Encounter: Payer: Self-pay | Admitting: Nurse Practitioner

## 2016-04-29 VITALS — BP 110/60 | HR 72 | Resp 14 | Ht 63.75 in | Wt 155.0 lb

## 2016-04-29 DIAGNOSIS — Z0000 Encounter for general adult medical examination without abnormal findings: Secondary | ICD-10-CM | POA: Diagnosis not present

## 2016-04-29 DIAGNOSIS — Z01419 Encounter for gynecological examination (general) (routine) without abnormal findings: Secondary | ICD-10-CM

## 2016-04-29 DIAGNOSIS — R81 Glycosuria: Secondary | ICD-10-CM | POA: Diagnosis not present

## 2016-04-29 DIAGNOSIS — E7800 Pure hypercholesterolemia, unspecified: Secondary | ICD-10-CM

## 2016-04-29 DIAGNOSIS — E2839 Other primary ovarian failure: Secondary | ICD-10-CM | POA: Insufficient documentation

## 2016-04-29 DIAGNOSIS — E785 Hyperlipidemia, unspecified: Secondary | ICD-10-CM | POA: Insufficient documentation

## 2016-04-29 DIAGNOSIS — D259 Leiomyoma of uterus, unspecified: Secondary | ICD-10-CM

## 2016-04-29 DIAGNOSIS — E1169 Type 2 diabetes mellitus with other specified complication: Secondary | ICD-10-CM | POA: Insufficient documentation

## 2016-04-29 DIAGNOSIS — N950 Postmenopausal bleeding: Secondary | ICD-10-CM | POA: Diagnosis not present

## 2016-04-29 DIAGNOSIS — R319 Hematuria, unspecified: Secondary | ICD-10-CM | POA: Insufficient documentation

## 2016-04-29 DIAGNOSIS — E559 Vitamin D deficiency, unspecified: Secondary | ICD-10-CM

## 2016-04-29 HISTORY — DX: Leiomyoma of uterus, unspecified: D25.9

## 2016-04-29 LAB — COMPREHENSIVE METABOLIC PANEL
ALT: 25 U/L (ref 6–29)
AST: 19 U/L (ref 10–35)
Albumin: 4.3 g/dL (ref 3.6–5.1)
Alkaline Phosphatase: 60 U/L (ref 33–130)
BUN: 20 mg/dL (ref 7–25)
CO2: 24 mmol/L (ref 20–31)
Calcium: 9.4 mg/dL (ref 8.6–10.4)
Chloride: 104 mmol/L (ref 98–110)
Creat: 0.67 mg/dL (ref 0.50–0.99)
Glucose, Bld: 128 mg/dL — ABNORMAL HIGH (ref 65–99)
Potassium: 4.1 mmol/L (ref 3.5–5.3)
Sodium: 141 mmol/L (ref 135–146)
Total Bilirubin: 0.5 mg/dL (ref 0.2–1.2)
Total Protein: 7.1 g/dL (ref 6.1–8.1)

## 2016-04-29 LAB — LIPID PANEL
Cholesterol: 136 mg/dL (ref 125–200)
HDL: 43 mg/dL — ABNORMAL LOW (ref >=46)
LDL Cholesterol: 67 mg/dL (ref <130)
Total CHOL/HDL Ratio: 3.2 Ratio (ref <=5.0)
Triglycerides: 128 mg/dL (ref <150)
VLDL: 26 mg/dL (ref <30)

## 2016-04-29 LAB — POCT URINALYSIS DIPSTICK
Bilirubin, UA: NEGATIVE
Ketones, UA: NEGATIVE
Leukocytes, UA: NEGATIVE
Nitrite, UA: NEGATIVE
Protein, UA: NEGATIVE
Urobilinogen, UA: NEGATIVE
pH, UA: 5

## 2016-04-29 LAB — CBC WITH DIFFERENTIAL/PLATELET
Basophils Absolute: 0 cells/uL (ref 0–200)
Basophils Relative: 0 %
Eosinophils Absolute: 69 cells/uL (ref 15–500)
Eosinophils Relative: 1 %
HCT: 42.3 % (ref 35.0–45.0)
Hemoglobin: 14.3 g/dL (ref 11.7–15.5)
Lymphocytes Relative: 34 %
Lymphs Abs: 2346 cells/uL (ref 850–3900)
MCH: 31.4 pg (ref 27.0–33.0)
MCHC: 33.8 g/dL (ref 32.0–36.0)
MCV: 92.8 fL (ref 80.0–100.0)
MPV: 10.5 fL (ref 7.5–12.5)
Monocytes Absolute: 552 cells/uL (ref 200–950)
Monocytes Relative: 8 %
Neutro Abs: 3933 cells/uL (ref 1500–7800)
Neutrophils Relative %: 57 %
Platelets: 294 10*3/uL (ref 140–400)
RBC: 4.56 MIL/uL (ref 3.80–5.10)
RDW: 12.9 % (ref 11.0–15.0)
WBC: 6.9 10*3/uL (ref 3.8–10.8)

## 2016-04-29 LAB — TSH: TSH: 0.77 mIU/L

## 2016-04-29 MED ORDER — ESCITALOPRAM OXALATE 10 MG PO TABS: 10.0000 mg | ORAL_TABLET | Freq: Every day | ORAL | 4 refills | Status: DC

## 2016-04-30 ENCOUNTER — Telehealth: Payer: Self-pay | Admitting: Emergency Medicine

## 2016-04-30 DIAGNOSIS — N950 Postmenopausal bleeding: Secondary | ICD-10-CM

## 2016-04-30 LAB — HEMOGLOBIN A1C
Hgb A1c MFr Bld: 6.8 % — ABNORMAL HIGH (ref <5.7)
Mean Plasma Glucose: 148 mg/dL

## 2016-04-30 LAB — URINALYSIS, MICROSCOPIC ONLY
Bacteria, UA: NONE SEEN [HPF]
Casts: NONE SEEN [LPF]
Crystals: NONE SEEN [HPF]
Yeast: NONE SEEN [HPF]

## 2016-04-30 LAB — VITAMIN D 25 HYDROXY (VIT D DEFICIENCY, FRACTURES): Vit D, 25-Hydroxy: 41 ng/mL (ref 30–100)

## 2016-04-30 LAB — HIV ANTIBODY (ROUTINE TESTING W REFLEX): HIV 1&2 Ab, 4th Generation: NONREACTIVE

## 2016-04-30 NOTE — Progress Notes (Signed)
Encounter reviewed by Dr. Aundria Rud. I am recommending a pelvic ultrasound and appointment with me for recurrent postmenopausal bleeding.

## 2016-04-30 NOTE — Telephone Encounter (Signed)
Call to patient and messages from Dr. Quincy Simmonds and Kem Boroughs, FNP discussed.  Patient agreeable to start with Pelvic ultrasound in office with Dr. Quincy Simmonds.  Needs PM appointment due to her schedule.  Pelvic ultrasound scheduled for 05/08/16 at 1500 with 1530 consult.  Patient will call back with any concerns prior to appointment.  Routing to provider for final review. Patient agreeable to disposition. Will close encounter.

## 2016-04-30 NOTE — Telephone Encounter (Signed)
-----   Message from Kem Boroughs, [place] sent at 04/30/2016  8:39 AM EDT ----- Please give pt a call with recommendations as per Dr. Quincy Simmonds.  ----- Message ----- From: Nunzio Cobbs, MD Sent: 04/30/2016   5:53 AM To: Kem Boroughs, FNP  Hello Patty,   I reviewed the patient's chart, and we need to start with a pelvic ultrasound.  I am not planning on doing an endometrial biopsy in the office as she had the ablation and I could not previously get inside the cavity.   I will need to discuss with her next appropriate steps which may include surgery.  She has had recurrent postmenopausal bleeding.  Thank you,   Brook  ----- Message ----- From: Kem Boroughs, FNP Sent: 04/29/2016  12:49 PM To: [place] Oletta Lamas, MD  Please review her chart and past eval for PMB - she does not want another PMB.  What is the next step?

## 2016-05-03 ENCOUNTER — Other Ambulatory Visit: Payer: Self-pay | Admitting: Nurse Practitioner

## 2016-05-08 ENCOUNTER — Encounter (INDEPENDENT_AMBULATORY_CARE_PROVIDER_SITE_OTHER): Payer: BLUE CROSS/BLUE SHIELD | Admitting: Obstetrics and Gynecology

## 2016-05-08 ENCOUNTER — Telehealth: Payer: Self-pay | Admitting: Obstetrics and Gynecology

## 2016-05-08 ENCOUNTER — Ambulatory Visit: Payer: BLUE CROSS/BLUE SHIELD

## 2016-05-08 ENCOUNTER — Other Ambulatory Visit: Payer: BLUE CROSS/BLUE SHIELD

## 2016-05-08 ENCOUNTER — Other Ambulatory Visit: Payer: BLUE CROSS/BLUE SHIELD | Admitting: Obstetrics and Gynecology

## 2016-05-08 NOTE — Telephone Encounter (Signed)
Left message to call Sadae Arrazola at [phone]. 

## 2016-05-08 NOTE — Telephone Encounter (Signed)
Spoke with patient. Patient will be out of town all next week. Appointment rescheduled to 05/22/2016 at 1:30 pm with 2 pm consult with Dr.Silva. She is agreeable to date and time.  Routing to provider for final review. Patient agreeable to disposition. Will close encounter.

## 2016-05-08 NOTE — Telephone Encounter (Signed)
Please contact patient to reschedule ultrasound appointment for recurrent postmenopausal bleeding.

## 2016-05-08 NOTE — Telephone Encounter (Signed)
Patient was in for ultrasound but had to leave because of another appointment. Will wait for a call to reschedule.

## 2016-05-18 NOTE — Progress Notes (Signed)
Erroneous encounter

## 2016-05-22 ENCOUNTER — Ambulatory Visit (INDEPENDENT_AMBULATORY_CARE_PROVIDER_SITE_OTHER): Payer: BLUE CROSS/BLUE SHIELD | Admitting: Obstetrics and Gynecology

## 2016-05-22 ENCOUNTER — Encounter: Payer: Self-pay | Admitting: Obstetrics and Gynecology

## 2016-05-22 ENCOUNTER — Ambulatory Visit (INDEPENDENT_AMBULATORY_CARE_PROVIDER_SITE_OTHER): Payer: BLUE CROSS/BLUE SHIELD

## 2016-05-22 VITALS — BP 116/64 | HR 80 | Ht 63.75 in | Wt 155.0 lb

## 2016-05-22 DIAGNOSIS — N950 Postmenopausal bleeding: Secondary | ICD-10-CM | POA: Diagnosis not present

## 2016-05-22 DIAGNOSIS — D259 Leiomyoma of uterus, unspecified: Secondary | ICD-10-CM | POA: Diagnosis not present

## 2016-05-22 NOTE — Progress Notes (Signed)
Patient ID: Christina Buchanan, female   DOB: 03-30-1956, 60 y.o.   MRN: [id number] GYNECOLOGY  VISIT   HPI: 60 y.o.   Married  Caucasian  female   G3P0003 with Patient's last menstrual period was 03/06/2006.   here for pelvic ultrasound for postmenopausal bleeding.   Hx endometrial ablation, HTA in 2004. No prior testing to document menopause by hormonal testing.   Had vaginal spotting - February 2017 and June 2017.  Not taking any hormonal therapy.   Pelvic ultrasound 05/10/15 for postmenopausal bleeding. Uterus - 2.7 cm fundal intramural fibroid.  Cannot rule out encroachment on endometrial canal. EMS - 1.90 mm. Ovaries - normal Free fluid - no Sonohysterogram showed no filling defects and inadequate fitting of the endometrial cavity.  Possible encroachment of fibroid into the endometrium.  EMB at that time:  Rare endocervical glands only. No endometrial tissue.   GYNECOLOGIC HISTORY: Patient's last menstrual period was 03/06/2006. Contraception:  vasectomy Menopausal hormone therapy:  none Last mammogram:  02-27-16 Density C/Neg/BiRads1:[facility] Last pap smear:   04-23-15 Neg:Neg HR HPV        OB History    Gravida Para Term Preterm AB Living   3 3 0 0 0 3   SAB TAB Ectopic Multiple Live Births   0 0 0 0 3         Patient Active Problem List   Diagnosis Date Noted  . Pure hypercholesterolemia 04/29/2016  . PMB (postmenopausal bleeding) 04/29/2016  . 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 Hematuria 04/29/2016  . Estrogen deficiency 04/29/2016  . Fibroid, uterine 04/29/2016  . Prediabetes 10/04/2015  . Elevated LFTs 12/12/2011  . Vitamin D deficiency 01/29/2010  . Hyperlipidemia 01/21/2010  . SMOKER 01/21/2010  . Depression 01/21/2010  . ALLERGIC RHINITIS 01/21/2010  . Personal history of colonic polyps 07/30/2006    Past Medical History:  Diagnosis Date  . ALLERGIC RHINITIS   . DEPRESSION   . Diabetes mellitus without complication ([place]) 123456  . HYPERLIPIDEMIA   .  Personal history of colonic polyps   . SMOKER   . VITAMIN D DEFICIENCY     Past Surgical History:  Procedure Laterality Date  . BREAST SURGERY  1990   breast biopsy  . COLONOSCOPY W/ BIOPSIES  9/07   2 polyps & tubuvillous adenoma  . COLONOSCOPY W/ BIOPSIES  11/13/08   sigmoid ddiverticuli and rechek in 5 years - Dr. Collene Mares  . ENDOMETRIAL ABLATION  08/21/03   HTA  . 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 UPPER GASTROINTESTINAL ENDOSCOPY  06/15/06   2 polyps - H pylori    Current Outpatient Prescriptions  Medication Sig Dispense Refill  . Ascorbic Acid (VITAMIN C) 100 MG tablet Take 100 mg by mouth daily.    [name] aspirin 81 MG tablet Take 81 mg by mouth daily.      [name] azelastine (ASTELIN) 0.1 % nasal spray USE ONE SPRAY IN EACH NOSTRIL DAILY AS NEEDED 90 mL 0  . escitalopram (LEXAPRO) 10 MG tablet Take 1 tablet (10 mg total) by mouth daily. 90 tablet 4  . simvastatin (ZOCOR) 20 MG tablet TAKE 1 TABLET (20 MG TOTAL) BY MOUTH DAILY AT 6 PM. 90 tablet 2   No current facility-administered medications for this visit.      ALLERGIES: Review of patient's allergies indicates no known allergies.  Family History  Problem Relation Age of Onset  . Breast cancer Mother 61  .  Diabetes Mother   . Stroke Mother            . Hyperlipidemia Mother   . Heart failure Mother   . Hypertension Mother   . COPD Mother   . Hypertension Father   . Heart disease Father   . Hyperlipidemia Father   . Heart failure Father     dec at age 66  . Breast cancer Sister 60  . Heart disease Sister     Afib  . Psoriasis Son     Social History   Social History  . Marital status: Married    Spouse name: N/A  . Number of children: N/A  . Years of education: N/A   Occupational History  . Not on file.   Social History Main Topics  . Smoking status: Light Tobacco Smoker    Packs/day: 0.25    Years: 30.00    Types: Cigarettes    Last attempt  to quit: 02/04/2015  . Smokeless tobacco: Never Used     Comment: Married, lives with spouse. Cares for elderly parent and in-laws  . Alcohol use No     Comment: Rare glass of wine  . Drug use: No  . Sexual activity: Yes    Partners: Male    Birth control/ protection: Other-see comments     Comment: husband had vasectomy   Other Topics Concern  . Not on file   Social History Narrative   Lives with husband in a one story home.  She has 3 grown children.     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     Education: high school.        No regular exercise    ROS:  Pertinent items are noted in HPI.  PHYSICAL EXAMINATION:    Ht 5' 3.75" (1.619 m)   LMP 03/06/2006     General appearance: alert, cooperative and appears stated age    Ultrasound today EMS 2.59 mm. 2 fibroids 0.76 and 2.62 cm, latter possibly encroaching on endometrium.  Normal ovaries.  No free fluid.  ASSESSMENT  Recurrent postmenopausal bleeding.  Hx HTA endometrial ablation. Fundal fibroid with probable encroachment on endometrial canal.  Atrophic appearing endometrium.   Nondiagnostic endometrial biopsy previously when endometrium was atrophic and measured 1.9 mm.  PLAN  Will check FSH and estradiol to confirm menopausal status.  Discussion of fibroids and postmenopausal bleeding and etiologies - atrophy, fibroid, polyp, hyperplasia and malignancy.  We discussed hysteroscopy with dilation and curettage versus hysterectomy for recurrent postmenopausal bleeding.  Risks and benefits of each reviewed.  We discussed the possibility that a hysteroscopy with dilation and curettage could be an unsuccessful procedure due to her endometrial ablation.  Plan to follow after hormonal testing is back.  An After Visit Summary was printed and given to the patient.  __25____ minutes face to face time of which over 50% was spent in counseling.

## 2016-05-22 NOTE — Progress Notes (Signed)
Opened in error

## 2016-05-23 LAB — ESTRADIOL: Estradiol: 19 pg/mL

## 2016-05-23 LAB — FOLLICLE STIMULATING HORMONE: FSH: 62.9 m[IU]/mL

## 2016-05-27 ENCOUNTER — Telehealth: Payer: Self-pay | Admitting: Obstetrics and Gynecology

## 2016-05-27 NOTE — Telephone Encounter (Signed)
Phone call to patient regarding postmenopausal bleeding.  Had bleeding several times last year.  Had bleeding twice since attempted EMB one year ago.   Had a nondiagnostic EMB last year.  This procedure was very painful for the patient.  She declines future EMB.   She wishes to proceed forward with laparoscopic hysterectomy with bilateral salpingectomy.  She understands that there is a low risk of undiagnosed malignancy which may require further surgical care by a gynecologist oncologist if malignancy is found on final surgical specimen.   Will proceed forward with precert and scheduling.

## 2016-06-03 ENCOUNTER — Telehealth: Payer: Self-pay | Admitting: *Deleted

## 2016-06-03 NOTE — Telephone Encounter (Signed)
Call to patient to check on surgery date options. Left message to call back.

## 2016-06-17 NOTE — Telephone Encounter (Signed)
Call to patient to follow-up on surgery date preferences. Left message to call back.  

## 2016-06-18 NOTE — Telephone Encounter (Signed)
Please have the patient come in for assessment of her bladder.  I would like to examine her and discuss urinary functioning to I can understand all of her needs.  I think we have block time on 12/26 for surgery?  Cc- Lamont Snowball

## 2016-06-18 NOTE — Telephone Encounter (Signed)
Patient returning call.

## 2016-06-18 NOTE — Telephone Encounter (Signed)
Spoke with patient. Patient is requesting surgery the week of December 25th. Asking to proceed on December 28 th or 29 th. Patient also states she has been advised by many peers "If I am going to have a hysterectomy I should have bladder tack surgery as well." Patient states this is something she is interested in having performed with her surgery if recommended by Dr.Silva and would like to know what she needs to do in order to have this performed with her surgery as well. Advised I will notify Lamont Snowball, RN of requested surgery dates and speak with Dr.Silva regarding additional surgical recommendations. She is agreeable.

## 2016-06-19 NOTE — Telephone Encounter (Signed)
Left message to call Cletus Mehlhoff at [phone]. 

## 2016-06-19 NOTE — Telephone Encounter (Signed)
Patient returned call. Scheduled for Thursday 06/26/16 at 1:00 pm.

## 2016-06-19 NOTE — Telephone Encounter (Signed)
Thank you for the update!

## 2016-06-19 NOTE — Telephone Encounter (Signed)
Routing to Dr.Silva as FYI.   Cc: Lamont Snowball, RN

## 2016-06-23 NOTE — Telephone Encounter (Signed)
Noted for surgery scheduling. Encounter closed.

## 2016-06-26 ENCOUNTER — Encounter: Payer: Self-pay | Admitting: Obstetrics and Gynecology

## 2016-06-26 ENCOUNTER — Ambulatory Visit (INDEPENDENT_AMBULATORY_CARE_PROVIDER_SITE_OTHER): Payer: BLUE CROSS/BLUE SHIELD | Admitting: Obstetrics and Gynecology

## 2016-06-26 VITALS — BP 116/68 | HR 70 | Resp 16 | Ht 63.75 in | Wt 157.0 lb

## 2016-06-26 DIAGNOSIS — N812 Incomplete uterovaginal prolapse: Secondary | ICD-10-CM | POA: Diagnosis not present

## 2016-06-26 DIAGNOSIS — N393 Stress incontinence (female) (male): Secondary | ICD-10-CM | POA: Diagnosis not present

## 2016-06-26 NOTE — Progress Notes (Signed)
GYNECOLOGY  VISIT   HPI: 60 y.o.   Married  Caucasian  female   G3P0003 with Patient's last menstrual period was 03/06/2006.   here for  F/u regarding possible surgeries.    Hoping for potential surgery right after Christmas.  Patient is considering surgery for recurrent postmenopausal bleeding.  Wants evaluation for bladder and potential bladder tack at time of hysterectomy surgery.  Leaks urine with sneeze. Leaks for sure with exercise and jumping.  Wears a pad. No leak for no reason at all. Urge to empty first thing in the am. DF - every 3 -4 hours. NF - none. Takes a while to complete her voiding.  Goes on and on.  No painful urination or blood in the urine.  Does not know when she last had a UTI - "a long time." No hx renal stones.   No constipation.  Has always had IBS.  This is better now.  No splinting. When she has an IBS flare, has some fecal soiling.   This happened all the time and then changed a couple of months ago when she starting avoiding gluten.  Largest vaginal delivery was 6 pounds, 13 ounces.  One forceps delivery - 6 pounds.  No hx of extensive laceration through rectum.   Sexually active.  No pain or discomfort.  She does feel a bulge.  Hx recurrent postmenopausal bleeding.   Hx endometrial ablation, HTA in 2004. No prior testing to document menopause by hormonal testing.   Had vaginal spotting - February 2017 and June 2017.  Not taking any hormonal therapy.   Pelvic ultrasound 05/10/15 for postmenopausal bleeding. Uterus - 2.7 cm fundal intramural fibroid. Cannot rule out encroachment on endometrial canal. EMS - 1.90 mm. Ovaries - normal Free fluid - no Sonohysterogram showed no filling defects and inadequate fitting of the endometrial cavity.  Possible encroachment of fibroid into the endometrium.  EMB at that time:  Rare endocervical glands only. No endometrial tissue.   Ultrasound done 05/22/16: EMS 2.59 mm. 2 fibroids 0.76 and 2.62  cm, latter possibly encroaching on endometrium.  Normal ovaries.  No free fluid.  FSH 62.9 and estradiol 19 on 05/22/16.  Hx of right colectomy for precancerous lesion of the colon.   GYNECOLOGIC HISTORY: Patient's last menstrual period was 03/06/2006. Contraception:  Husband vasectomy Menopausal hormone therapy:  none Last mammogram:  02-27-16 category c density birads 1:neg Last pap smear:   04-23-15 neg HPV HR neg        OB History    Gravida Para Term Preterm AB Living   3 3 0 0 0 3   SAB TAB Ectopic Multiple Live Births   0 0 0 0 3         Patient Active Problem List   Diagnosis Date Noted  . Pure hypercholesterolemia 04/29/2016  . PMB (postmenopausal bleeding) 04/29/2016  . 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 Hematuria 04/29/2016  . Estrogen deficiency 04/29/2016  . Fibroid, uterine 04/29/2016  . Prediabetes 10/04/2015  . Elevated LFTs 12/12/2011  . Vitamin D deficiency 01/29/2010  . Hyperlipidemia 01/21/2010  . SMOKER 01/21/2010  . Depression 01/21/2010  . ALLERGIC RHINITIS 01/21/2010  . Personal history of colonic polyps 07/30/2006    Past Medical History:  Diagnosis Date  . ALLERGIC RHINITIS   . DEPRESSION   . Diabetes mellitus without complication ([place]) 123456  . HYPERLIPIDEMIA   . Personal history of colonic polyps   . SMOKER   . VITAMIN D DEFICIENCY     Past  Surgical History:  Procedure Laterality Date  . BREAST SURGERY  1990   breast biopsy  . COLONOSCOPY W/ BIOPSIES  9/07   2 polyps & tubuvillous adenoma  . COLONOSCOPY W/ BIOPSIES  11/13/08   sigmoid ddiverticuli and rechek in 5 years - Dr. Collene Mares  . ENDOMETRIAL ABLATION  08/21/03   HTA  . 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 UPPER GASTROINTESTINAL ENDOSCOPY  06/15/06   2 polyps - H pylori    Current Outpatient Prescriptions  Medication Sig Dispense Refill  . Ascorbic Acid (VITAMIN C) 100 MG tablet Take 100 mg by mouth daily.    [name] aspirin 81  MG tablet Take 81 mg by mouth daily.      [name] azelastine (ASTELIN) 0.1 % nasal spray USE ONE SPRAY IN EACH NOSTRIL DAILY AS NEEDED 90 mL 0  . escitalopram (LEXAPRO) 10 MG tablet Take 1 tablet (10 mg total) by mouth daily. 90 tablet 4  . simvastatin (ZOCOR) 20 MG tablet TAKE 1 TABLET (20 MG TOTAL) BY MOUTH DAILY AT 6 PM. 90 tablet 2   No current facility-administered medications for this visit.      ALLERGIES: Review of patient's allergies indicates no known allergies.  Family History  Problem Relation Age of Onset  . Breast cancer Mother 38  . Diabetes Mother   . Stroke Mother            . Hyperlipidemia Mother   . Heart failure Mother   . Hypertension Mother   . COPD Mother   . Hypertension Father   . Heart disease Father   . Hyperlipidemia Father   . Heart failure Father     dec at age 52  . Breast cancer Sister 51  . Heart disease Sister     Afib  . Psoriasis Son     Social History   Social History  . Marital status: Married    Spouse name: N/A  . Number of children: N/A  . Years of education: N/A   Occupational History  . Not on file.   Social History Main Topics  . Smoking status: Light Tobacco Smoker    Packs/day: 0.25    Years: 30.00    Types: Cigarettes    Last attempt to quit: 02/04/2015  . Smokeless tobacco: Never Used     Comment: Married, lives with spouse. Cares for elderly parent and in-laws  . Alcohol use No     Comment: Rare glass of wine  . Drug use: No  . Sexual activity: Yes    Partners: Male    Birth control/ protection: Other-see comments     Comment: husband had vasectomy   Other Topics Concern  . Not on file   Social History Narrative   Lives with husband in a one story home.  She has 3 grown children.     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     Education: high school.        No regular exercise    ROS:  Pertinent items are noted in HPI.  PHYSICAL EXAMINATION:    BP 116/68   Pulse 70   Resp 16   Ht 5' 3.75" (1.619 m)   Wt 157 lb  (71.2 kg)   LMP 03/06/2006   BMI 27.16 kg/m     General appearance: alert, cooperative and appears stated age   Abdomen: right midabdominal transverse incision, soft, non-tender, no masses,  no organomegaly  Pelvic: External genitalia:  no lesions              Urethra:  normal appearing urethra with no masses, tenderness or lesions              Bartholins and Skenes: normal                 Vagina: normal appearing vagina with normal color and discharge, no lesions.  Third degree cystocele, almost second degree uterine prolapse, and first degree rectocele.               Cervix: no lesions                Bimanual Exam:  Uterus:  normal size, contour, position, consistency, mobility, non-tender              Adnexa: no mass, fullness, tenderness              Rectal exam: Yes.  .  Confirms.              Anus:  normal sphincter tone, no lesions  Chaperone was present for exam.  ASSESSMENT  Recurrent postmenopausal bleeding.  Hx HTA endometrial ablation. Fundal fibroid with probable encroachment on endometrial canal.  Atrophic appearing endometrium.   Nondiagnostic endometrial biopsy previously when endometrium was atrophic and measured 1.9 mm. Incomplete uterovaginal prolapse.  Genuine stress incontinence. Status post right colectomy.    PLAN  Comprehensive discussion regarding prolapse and incontinence.   I do recommend a comprehensive approach to surgical care if proceeding with a hysterectomy procedure.  This can be accomplished as a total vaginal hysterectomy, possible bilateral salpingo-oophorectomy, anterior and posterior colporrhaphy, sacrospinous vaginal vault suspension, TVT midurethral sling and cystoscopy OR a total abdominal hysterectomy with bilateral salpingo-oophorectomy, abdominosacrocolpopexy, Halban's culdoplasty, anterior and posterior colporrhaphy, TVT Exact midurethral sling, and cystoscopy.    We compared and contrasted these procedures and the risks of  recurrence of prolapse and incontinence with each.  The abdominal approach will give better longevity, but may be significant for bowel and intra-abdominal adhesions.   I informed her of a potential total laparoscopic approach using robotic assistance which would necessitate referral outside the practice.  She knows that she may request this, but she is declining at this time.  Will proceed with urodynamic testing with reduction of prolapse using a pessary.   Further discussion to follow.   I did give patient ACOG handouts on urinary incontinence and pelvic organ prolapse and surgical correction of these conditions.   An After Visit Summary was printed and given to the patient.  __25____ minutes face to face time of which over 50% was spent in counseling.

## 2016-07-04 ENCOUNTER — Other Ambulatory Visit (INDEPENDENT_AMBULATORY_CARE_PROVIDER_SITE_OTHER): Payer: Self-pay | Admitting: Otolaryngology

## 2016-07-04 DIAGNOSIS — D447 Neoplasm of uncertain behavior of aortic body and other paraganglia: Secondary | ICD-10-CM

## 2016-07-09 ENCOUNTER — Ambulatory Visit
Admission: RE | Admit: 2016-07-09 | Discharge: 2016-07-09 | Disposition: A | Discharge status: Home / Self Care | Payer: BLUE CROSS/BLUE SHIELD | Source: Ambulatory Visit | Attending: Otolaryngology | Admitting: Otolaryngology

## 2016-07-09 DIAGNOSIS — D447 Neoplasm of uncertain behavior of aortic body and other paraganglia: Secondary | ICD-10-CM

## 2016-07-12 ENCOUNTER — Encounter: Payer: Self-pay | Admitting: Obstetrics and Gynecology

## 2016-07-22 ENCOUNTER — Telehealth: Payer: Self-pay | Admitting: *Deleted

## 2016-07-22 NOTE — Telephone Encounter (Signed)
Message left per DPR for patient to return call to [facility] at [phone] if she is available to come in today for UA and then proceed with urodynamics testing tomorrow.

## 2016-07-22 NOTE — Telephone Encounter (Signed)
Patient returned call. Discussed the below. [place] at the beach and unable to come in to office this week.   Urodynamics Bladder Testing  Your physician has recommended a procedure for you to be done in our office called Urodynamics. Your Urodynamics testing appointment is scheduled for Wednesday 08/20/16 at 0900. Please arrive 15 minutes prior to your appointment for check in.   What is Urodynamics? Urodynamics is a test that looks at the behavior of the bladder. There are different types of urinary incontinence and they each require different treatments.  Although this test may be very personal in nature, we are attempting to recreate your urinary leakage during testing so that your physician can ensure you have the right treatment for your diagnosis.  Patient instructions: . You will need to come to our office 2-3 days prior for a urine test to ensure you do not have any urinary infection prior to the procedure.   . Your appointment for urine testing is scheduled for Friday 08/15/16 at 0930.  [name] Please stop any bladder control medications one week prior to the procedure. This includes: Ditropan, Detrol, Enablex, Oxytrol, Sanctura, and Vesicare. You may take all of your other medications as prescribed.  . Please arrive for your appointment with a comfortably full bladder, as you will need to void at the beginning of testing. . You may eat and drink normally on the day of your test.  You may shower normally on the day of your test.  Patient information: . The test will take approximately one hour to complete.  It is not painful and you will be with a nurse and assistant for the procedure. . You will be asked to empty your bladder in private, into a special toilet. . Small catheters (thin flexible tube) will be inserted into your bladder and rectum which will record pressure readings of your bladder into a computer.   . Your bladder will be filled with sterile water until you feel the need to  urinate.  . You will be directed to cough and bare down at various stages during the test.  . At the end of the test you will be allowed to empty your bladder.  After your testing is completed: [name] You may have a mild bladder and rectal discomfort for a few hours after the test. . You may experience some frequent urination and slight burning the first few times you urinate after the test. Rarely, the urine may be blood tinged. These are both due to catheter placements and resolve quickly.  . You should call our office immediately if you have signs of infection, which may include bladder pain, urinary urgency, fever, or burning during urination. .  Most people tolerate this test very well and experience only minimal discomfort during placement of catheters.  .  We do encourage you to drink plenty of water after the test.  You will have an appointment approximately one week after testing to discuss your results of this testing with your physician.      This follow up appointment is scheduled for Monday 09/08/16 at 1430.   Please call our office if you have any concerns or need to reschedule this procedure. If you do not cancel your procedure 3 business days in advance, you will incur a $100.00 cancellation charge.  Thank you for allowing [place] to partner in your care and planning for your procedures.    Patient agreeable to date and time of all appointments. Shade Flood, RN will be  calling once surgery date is finalized.     Routing to provider for final review. Patient agreeable to disposition. Will close encounter.   Randell Loop, RN

## 2016-07-23 ENCOUNTER — Telehealth: Payer: Self-pay | Admitting: *Deleted

## 2016-07-23 NOTE — Telephone Encounter (Signed)
Call to patient to discuss surgery date options for late December as previously requested by patient. Advised with holiday closings, options are 12-19 and 10-02-16.  Patient strongly desires 10-02-16.  Urodynamic testing is scheduled for 08-20-16. Patient is currently out of town on vacation so will defer surgery instructions until patient comes for next appointment.    Routing to provider for final review. Patient agreeable to disposition. Will close encounter.

## 2016-07-28 ENCOUNTER — Telehealth: Payer: Self-pay | Admitting: *Deleted

## 2016-07-28 NOTE — Telephone Encounter (Signed)
Reviewed with Dr Quincy Simmonds. Patient diabetic and scheduled for surgery 10-02-16. Per Dr Quincy Simmonds, needs PCP appointment  And recheck of HgbA1C. Best to be done 6 weeks prior to surgery so changes can be made if needed. Last A1C was 6.8 which was up from previous 6.2.   Call to patient, left message to call back on VM regarding need for PCP appointment. Per ROI, can leave detailed message on VM and VM message confirms patient's first and last name.

## 2016-08-02 ENCOUNTER — Other Ambulatory Visit: Payer: Self-pay | Admitting: Internal Medicine

## 2016-08-05 ENCOUNTER — Ambulatory Visit (INDEPENDENT_AMBULATORY_CARE_PROVIDER_SITE_OTHER): Payer: BLUE CROSS/BLUE SHIELD | Admitting: Family Medicine

## 2016-08-05 ENCOUNTER — Encounter: Payer: Self-pay | Admitting: Family Medicine

## 2016-08-05 VITALS — BP 128/82 | HR 88 | Temp 98.4°F | Wt 158.0 lb

## 2016-08-05 DIAGNOSIS — J40 Bronchitis, not specified as acute or chronic: Secondary | ICD-10-CM

## 2016-08-05 DIAGNOSIS — B9689 Other specified bacterial agents as the cause of diseases classified elsewhere: Secondary | ICD-10-CM

## 2016-08-05 DIAGNOSIS — J329 Chronic sinusitis, unspecified: Secondary | ICD-10-CM

## 2016-08-05 MED ORDER — DOXYCYCLINE HYCLATE 100 MG PO TABS: 100.0000 mg | ORAL_TABLET | Freq: Two times a day (BID) | ORAL | 0 refills | Status: DC

## 2016-08-05 NOTE — Progress Notes (Signed)
Pre visit review using our clinic review tool, if applicable. No additional management support is needed unless otherwise documented below in the visit note. 

## 2016-08-05 NOTE — Patient Instructions (Signed)
Doxycycline for 10 days to cover potential bacterial sinusitis  I am concerned there is an underlying bronchitis (often viral) that may last in 3-6 week range though.   I would say if not doing better in 14 days or if worsening symptoms including fever or shortness of breath- get reevaluated and consider chest x-ray

## 2016-08-05 NOTE — Progress Notes (Signed)
Subjective:  OMA KORNBLUTH is a 60 y.o. year old very pleasant female patient who presents for/with See problem oriented charting ROS-see any ROS included in HPI section  Past Medical History-  Patient Active Problem List   Diagnosis Date Noted  . Pure hypercholesterolemia 04/29/2016  . PMB (postmenopausal bleeding) 04/29/2016  . 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 Hematuria 04/29/2016  . Estrogen deficiency 04/29/2016  . Fibroid, uterine 04/29/2016  . Prediabetes 10/04/2015  . Elevated LFTs 12/12/2011  . Vitamin D deficiency 01/29/2010  . Hyperlipidemia 01/21/2010  . SMOKER 01/21/2010  . Depression 01/21/2010  . ALLERGIC RHINITIS 01/21/2010  . Personal history of colonic polyps 07/30/2006    Medications- reviewed and updated Current Outpatient Prescriptions  Medication Sig Dispense Refill  . Ascorbic Acid (VITAMIN C) 100 MG tablet Take 100 mg by mouth daily.    [name] aspirin 81 MG tablet Take 81 mg by mouth daily.      [name] azelastine (ASTELIN) 0.1 % nasal spray USE ONE SPRAY IN EACH NOSTRIL DAILY AS NEEDED 90 mL 0  . escitalopram (LEXAPRO) 10 MG tablet Take 1 tablet (10 mg total) by mouth daily. 90 tablet 4  . simvastatin (ZOCOR) 20 MG tablet Take 1 tablet by mouth daily at 6 PM ---- Needs appt for further refills 90 tablet 0  . Vitamin D, Ergocalciferol, (DRISDOL) 50000 units CAPS capsule Take 50,000 Units by mouth every 7 (seven) days.     No current facility-administered medications for this visit.     Objective: BP 128/82 (BP Location: Left Arm, Patient Position: Sitting, Cuff Size: Normal)   Pulse 88   Temp 98.4 F (36.9 C) (Oral)   Wt 158 lb (71.7 kg)   LMP 03/06/2006   SpO2 95%   BMI 27.33 kg/m  Gen: NAD, resting comfortably, very well appearing TM largely normal. Oropharynx largely normal, minimal maxillary sinus tenderness, nares erythematous with clear drainage/rhinorrhea CV: RRR no murmurs rubs or gallops Lungs: CTAB no crackles, wheeze, rhonchi Ext: no edema Skin:  warm, dry  Assessment/Plan:  Cough for 2 weeks, recurrent sinus congestion and pressure  [place]: at the beach 2 [place] this past Sunday. On [place] care [place]- was treated with z  Pack for sinus infection and tessalon perles. Dried her up. Started coughing and will have heavy coughing spells- feels like she is coughing her lung up. Went into [facility] [employer] in clinic 3 days ago and was told just continue current regimen- tessalon perles help but about to run out. Started to get clogged back up in nasal passages and into ears. Minimal pressure this time but had heavy pressure before. Continues to feel like dep in chest.     Husband had a stroke a week ago at the beach and son has similar symptoms. Multiple potential sick contacts  ROS- no fever or chills. No shortness of breath. Wheezed on one occasion but not regularly. No chest pain  A/P: 60 year old female with over 2 weeks of cough and now recurrent sinus pressure after previous round of azithromycin suspicious for bacterial sinusius. Shared with patient I was concerned for underlying bronchitis as well. Given recurrent sinus congestion/pressure would meet criteria for bacterial sinusitis so we could try to treat that (she declines augmentin due to GI effects) and did agree to doxycycline 100mg  BID. We discussed that if sh ealso has viral bronchitis which I think is a strong possibility- symptoms may not resolve within 10 day time frame- if continued symptoms  or worsening symptoms should retrun to PCP within 2 weeks for consideration of imaging chest  Finally we discussed her recent a1c fo 6.8 and prior CBG over 125 and that I would not label but encouraged her to follow up with PCP as this is likely diabetes. She states she will consider going in sooner but also provided dietary counseling to her surrounding upcoming holidays.   Meds ordered this encounter  Medications  . doxycycline (VIBRA-TABS) 100 MG tablet    Sig: Take 1  tablet (100 mg total) by mouth 2 (two) times daily.    Dispense:  20 tablet    Refill:  0   The duration of face-to-face time during this visit was greater than 25 minutes. Greater than 50% of this time was spent in counseling about potential diabetes, dietary changes needed, discussion on role of antibiotic in her case, discussion on potential for bronchitis and probability of this being viral even underlying bacterial sinusitis    Return precautions advised.  Garret Reddish, MD

## 2016-08-07 NOTE — Telephone Encounter (Signed)
patient returning your call °

## 2016-08-08 NOTE — Telephone Encounter (Signed)
Call back to patient. She has been delayed in calling back due to husband having a stroke while they were on vacation at the beach. He is ok and she plans to proceed with surgery.  Advised of Dr Elza Rafter instructions for PCP appointment for mangement of slight increase in Hgb A1C level. Patient states she is scheduled for appointment 09-13-16, advised she really needs to proceed on to appointment now, 6 weeks prior to surgery per Dr Quincy Simmonds, so that there is time for any adjustments to be made.  Patient states she will contact PCP for appointment and have them send clearance information.   Routing to provider for final review. Patient agreeable to disposition. Will close encounter.

## 2016-08-12 ENCOUNTER — Ambulatory Visit (INDEPENDENT_AMBULATORY_CARE_PROVIDER_SITE_OTHER): Payer: BLUE CROSS/BLUE SHIELD | Admitting: Internal Medicine

## 2016-08-12 ENCOUNTER — Other Ambulatory Visit (INDEPENDENT_AMBULATORY_CARE_PROVIDER_SITE_OTHER): Payer: BLUE CROSS/BLUE SHIELD

## 2016-08-12 VITALS — BP 124/86 | HR 74 | Temp 98.5°F | Resp 16 | Ht 64.0 in | Wt 158.0 lb

## 2016-08-12 DIAGNOSIS — E119 Type 2 diabetes mellitus without complications: Secondary | ICD-10-CM | POA: Insufficient documentation

## 2016-08-12 DIAGNOSIS — E7800 Pure hypercholesterolemia, unspecified: Secondary | ICD-10-CM | POA: Diagnosis not present

## 2016-08-12 DIAGNOSIS — E1169 Type 2 diabetes mellitus with other specified complication: Secondary | ICD-10-CM | POA: Insufficient documentation

## 2016-08-12 LAB — COMPREHENSIVE METABOLIC PANEL
ALT: 25 U/L (ref 0–35)
AST: 19 U/L (ref 0–37)
Albumin: 4.3 g/dL (ref 3.5–5.2)
Alkaline Phosphatase: 51 U/L (ref 39–117)
BUN: 23 mg/dL (ref 6–23)
CO2: 29 mEq/L (ref 19–32)
Calcium: 9.6 mg/dL (ref 8.4–10.5)
Chloride: 104 mEq/L (ref 96–112)
Creatinine, Ser: 0.86 mg/dL (ref 0.40–1.20)
GFR: 71.17 mL/min (ref >60.00)
Glucose, Bld: 148 mg/dL — ABNORMAL HIGH (ref 70–99)
Potassium: 3.7 mEq/L (ref 3.5–5.1)
Sodium: 139 mEq/L (ref 135–145)
Total Bilirubin: 0.4 mg/dL (ref 0.2–1.2)
Total Protein: 7.3 g/dL (ref 6.0–8.3)

## 2016-08-12 LAB — HEMOGLOBIN A1C: Hgb A1c MFr Bld: 6.6 % — ABNORMAL HIGH (ref 4.6–6.5)

## 2016-08-12 NOTE — Progress Notes (Signed)
Subjective:    Patient ID: Christina Buchanan, female    DOB: 1956-02-17, 60 y.o.   MRN: [id number]  HPI She is here for an acute visit.   She is having a complete hysterectomy the end of December for postmenopausal bleeding.  She follows with Dr Quincy Simmonds. Dr Quincy Simmonds wanted her to come in to make sure her sugars were well controlled.    Last week her husband had a stroke and they have made changes in their eating habits.  She is currently not exercising regularly, but knows she needs to. She has an elliptical at home and plans on starting to use it.  Discussed that she does have diabetes.  Her last sugar was in the diabetic range.  She admits they have made some changes in their diet, but she feels she needs specific instructions on what to eat/not eat.  She eats sweets and fruit, but typically does not overdo carbs.  She often eats a lot of fruit at one time.  She denies drinking alcohol and soda.  She would be interested in seeing a nutritionist.    She wants to avoid medications if she can and avoid complications from diabetes.    Medications and allergies reviewed with patient and updated if appropriate.  Patient Active Problem List   Diagnosis Date Noted  . Pure hypercholesterolemia 04/29/2016  . PMB (postmenopausal bleeding) 04/29/2016  . 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 Hematuria 04/29/2016  . Estrogen deficiency 04/29/2016  . Fibroid, uterine 04/29/2016  . Prediabetes 10/04/2015  . Elevated LFTs 12/12/2011  . Vitamin D deficiency 01/29/2010  . Hyperlipidemia 01/21/2010  . SMOKER 01/21/2010  . Depression 01/21/2010  . ALLERGIC RHINITIS 01/21/2010  . Personal history of colonic polyps 07/30/2006    Current Outpatient Prescriptions on File Prior to Visit  Medication Sig Dispense Refill  . Ascorbic Acid (VITAMIN C) 100 MG tablet Take 100 mg by mouth daily.    [name] aspirin 81 MG tablet Take 81 mg by mouth daily.      [name] azelastine (ASTELIN) 0.1 % nasal spray USE ONE SPRAY IN EACH  NOSTRIL DAILY AS NEEDED 90 mL 0  . doxycycline (VIBRA-TABS) 100 MG tablet Take 1 tablet (100 mg total) by mouth 2 (two) times daily. 20 tablet 0  . escitalopram (LEXAPRO) 10 MG tablet Take 1 tablet (10 mg total) by mouth daily. 90 tablet 4  . simvastatin (ZOCOR) 20 MG tablet Take 1 tablet by mouth daily at 6 PM ---- Needs appt for further refills 90 tablet 0  . Vitamin D, Ergocalciferol, (DRISDOL) 50000 units CAPS capsule Take 50,000 Units by mouth every 7 (seven) days.     No current facility-administered medications on file prior to visit.     Past Medical History:  Diagnosis Date  . ALLERGIC RHINITIS   . DEPRESSION   . Diabetes mellitus without complication ([place]) 123456  . HYPERLIPIDEMIA   . Personal history of colonic polyps   . SMOKER   . VITAMIN D DEFICIENCY     Past Surgical History:  Procedure Laterality Date  . BREAST SURGERY  1990   breast biopsy  . COLONOSCOPY W/ BIOPSIES  9/07   2 polyps & tubuvillous adenoma  . COLONOSCOPY W/ BIOPSIES  11/13/08   sigmoid ddiverticuli and rechek in 5 years - Dr. Collene Mares  . ENDOMETRIAL ABLATION  08/21/03   HTA  . 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 UPPER GASTROINTESTINAL ENDOSCOPY  06/15/06   2 polyps - H pylori    Social History   Social History  . Marital status: Married    Spouse name: N/A  . Number of children: N/A  . Years of education: N/A   Social History Main Topics  . Smoking status: Light Tobacco Smoker    Packs/day: 0.25    Years: 30.00    Types: Cigarettes    Last attempt to quit: 02/04/2015  . Smokeless tobacco: Never Used     Comment: Married, lives with spouse. Cares for elderly parent and in-laws  . Alcohol use No     Comment: Rare glass of wine  . Drug use: No  . Sexual activity: Yes    Partners: Male    Birth control/ protection: Other-see comments     Comment: husband had vasectomy   Other Topics Concern  . Not on file   Social History  Narrative   Lives with husband in a one story home.  She has 3 grown children.     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     Education: high school.        No regular exercise    Family History  Problem Relation Age of Onset  . Breast cancer Mother 3  . Diabetes Mother   . Stroke Mother            . Hyperlipidemia Mother   . Heart failure Mother   . Hypertension Mother   . COPD Mother   . Hypertension Father   . Heart disease Father   . Hyperlipidemia Father   . Heart failure Father     dec at age 97  . Breast cancer Sister 75  . Heart disease Sister     Afib  . Psoriasis Son     Review of Systems  Constitutional: Negative for fever.  Eyes: Negative for visual disturbance.  Respiratory: Negative for cough and shortness of breath.   Cardiovascular: Negative for chest pain, palpitations and leg swelling.  Endocrine: Negative for polydipsia and polyuria.  Neurological: Negative for light-headedness and headaches.       Objective:   Vitals:   08/12/16 1618  BP: 124/86  Pulse: 74  Resp: 16  Temp: 98.5 F (36.9 C)   Filed Weights   08/12/16 1618  Weight: 158 lb (71.7 kg)   Body mass index is 27.12 kg/m.   Physical Exam Constitutional: Appears well-developed and well-nourished. No distress.  HENT:  Head: Normocephalic and atraumatic.  Neck: Neck supple. No tracheal deviation present. No thyromegaly present.  No cervical lymphadenopathy Cardiovascular: Normal rate, regular rhythm and normal heart sounds.   No murmur heard. No carotid bruit .  No edema Pulmonary/Chest: Effort normal and breath sounds normal. No respiratory distress. No has no wheezes. No rales.  Skin: Skin is warm and dry. Not diaphoretic.  Psychiatric: Normal mood and affect. Behavior is normal.         Assessment & Plan:   See Problem List for Assessment and Plan of chronic medical problems.  30 minutes were spent face-to-face with the patient, over 50% of which was spent counseling  regarding her new diagnosis of diabetes, including treatment, complications due to uncontrolled diabetes and routine testing   F/u in 6 months sooner if needed.

## 2016-08-12 NOTE — Patient Instructions (Addendum)
Test(s) ordered today. Your results will be released to [place] (or called to you) after review, usually within 72hours after test completion. If any changes need to be made, you will be notified at that same time.   Medications reviewed and updated.  No changes recommended at this time.  A referral was ordered for a nutritionist.    Please followup in 6 months     Diabetes and Exercise Exercising regularly is important. It is not just about losing weight. It has many health benefits, such as:  Improving your overall fitness, flexibility, and endurance.  Increasing your bone density.  Helping with weight control.  Decreasing your body fat.  Increasing your muscle strength.  Reducing stress and tension.  Improving your overall health. People with diabetes who exercise gain additional benefits because exercise:  Reduces appetite.  Improves the body's use of blood sugar (glucose).  Helps lower or control blood glucose.  Decreases blood pressure.  Helps control blood lipids (such as cholesterol and triglycerides).  Improves the body's use of the hormone insulin by:  Increasing the body's insulin sensitivity.  Reducing the body's insulin needs.  Decreases the risk for heart disease because exercising:  Lowers cholesterol and triglycerides levels.  Increases the levels of good cholesterol (such as high-density lipoproteins [HDL]) in the body.  Lowers blood glucose levels. YOUR ACTIVITY PLAN  Choose an activity that you enjoy, and set realistic goals. To exercise safely, you should begin practicing any new physical activity slowly, and gradually increase the intensity of the exercise over time. Your health care provider or diabetes educator can help create an activity plan that works for you. General recommendations include:  Encouraging children to engage in at least 60 minutes of physical activity each day.  Stretching and performing strength training  exercises, such as yoga or weight lifting, at least 2 times per week.  Performing a total of at least 150 minutes of moderate-intensity exercise each week, such as brisk walking or water aerobics.  Exercising at least 3 days per week, making sure you allow no more than 2 consecutive days to pass without exercising.  Avoiding long periods of inactivity (90 minutes or more). When you have to spend an extended period of time sitting down, take frequent breaks to walk or stretch. RECOMMENDATIONS FOR EXERCISING WITH TYPE 1 OR TYPE 2 DIABETES   Check your blood glucose before exercising. If blood glucose levels are greater than 240 mg/dL, check for urine ketones. Do not exercise if ketones are present.  Avoid injecting insulin into areas of the body that are going to be exercised. For example, avoid injecting insulin into:  The arms when playing tennis.  The legs when jogging.  Keep a record of:  Food intake before and after you exercise.  Expected peak times of insulin action.  Blood glucose levels before and after you exercise.  The type and amount of exercise you have done.  Review your records with your health care provider. Your health care provider will help you to develop guidelines for adjusting food intake and insulin amounts before and after exercising.  If you take insulin or oral hypoglycemic agents, watch for signs and symptoms of hypoglycemia. They include:  Dizziness.  Shaking.  Sweating.  Chills.  Confusion.  Drink plenty of water while you exercise to prevent dehydration or heat stroke. Body water is lost during exercise and must be replaced.  Talk to your health care provider before starting an exercise program to make sure it is  safe for you. Remember, almost any type of activity is better than none.   This information is not intended to replace advice given to you by your health care provider. Make sure you discuss any questions you have with your health  care provider.   Document Released: 12/13/2003 Document Revised: 02/06/2015 Document Reviewed: 03/01/2013 Elsevier Interactive Patient Education [employer].

## 2016-08-12 NOTE — Progress Notes (Signed)
Patient received education resource, including the self-management goal and tool. Patient verbalized understanding. 

## 2016-08-13 ENCOUNTER — Encounter: Payer: Self-pay | Admitting: Internal Medicine

## 2016-08-13 NOTE — Assessment & Plan Note (Signed)
Discussed low sugar/carb diet - will refer to nutrition for further education/instruction Stressed regular exercise and weight loss Discussed check urine yearly, checking feet daily and eye exams once a year Discussed complications from diabetes if uncontrolled Check a1c, cmp today Will not start medication unless uncontrolled.  I believe she is motivated to make changes in her diet and will start exercising Can recheck a1c Mid December to confirm good control  Deferred glucometer today, but will need one at home eventually

## 2016-08-14 ENCOUNTER — Encounter: Payer: Self-pay | Admitting: Internal Medicine

## 2016-08-15 ENCOUNTER — Ambulatory Visit (INDEPENDENT_AMBULATORY_CARE_PROVIDER_SITE_OTHER): Payer: BLUE CROSS/BLUE SHIELD | Admitting: *Deleted

## 2016-08-15 VITALS — BP 126/74 | HR 68 | Ht 63.75 in | Wt 157.0 lb

## 2016-08-15 DIAGNOSIS — Z01812 Encounter for preprocedural laboratory examination: Secondary | ICD-10-CM | POA: Diagnosis not present

## 2016-08-15 LAB — POCT URINALYSIS DIPSTICK
Bilirubin, UA: NEGATIVE
Blood, UA: NEGATIVE
Glucose, UA: NEGATIVE
Ketones, UA: NEGATIVE
Leukocytes, UA: NEGATIVE
Nitrite, UA: NEGATIVE
Protein, UA: NEGATIVE
Urobilinogen, UA: NEGATIVE
pH, UA: 5

## 2016-08-15 NOTE — Progress Notes (Signed)
Patient ID: Christina Buchanan, female   DOB: 07-30-1956, 60 y.o.   MRN: [id number]  Patient presents for urinalysis prior to Urodynamics testing.  Patient denies any urinary symptoms.  Today is her last day of Doxycycline for an upper respiratory infection.  She is feeling better and is having no fevers from this.    Patient's urine is negative today and patient is notified.  Advised patient to call with any UTI symptoms prior to procedure.  UTI symptoms reviewed with the patient.  Patient voices good understanding of instructions and is agreeable with plan.  Routing to provider for final review.

## 2016-08-20 ENCOUNTER — Ambulatory Visit (INDEPENDENT_AMBULATORY_CARE_PROVIDER_SITE_OTHER): Payer: BLUE CROSS/BLUE SHIELD | Admitting: *Deleted

## 2016-08-20 DIAGNOSIS — N812 Incomplete uterovaginal prolapse: Secondary | ICD-10-CM | POA: Diagnosis not present

## 2016-08-20 DIAGNOSIS — N393 Stress incontinence (female) (male): Secondary | ICD-10-CM

## 2016-08-20 NOTE — Progress Notes (Signed)
Christina Buchanan is a 60 y.o. female Who presents today for urodynamics testing, ordered by Dr. Quincy Simmonds.   Allergies and medications reviewed.  Denies complaints today. No urinary complaints.   Urine Micro exam: negative for WBC's or RBC's, okay to proceed per Dr. Quincy Simmonds.  Patient reports urinary leakage with coughing, sneezing, exercise.   Ring pessary size 4 placed prior to Urodynamics.   Urodynamics testing initiated. Lumax Bladder Catheter #10 [place] and lumax Abdominal Catheter #10 [place].   Post void residual 25 ml.   Urethral catheter placed without issue. Rectal catheter placed without issue.   Urodynamics testing completed. Please see scanned Patient summary report in Epic. Procedure completed and patient tolerated well without complaints. Catheters removed. Pessary removed. Patient scheduled for follow up office visit with Dr. Quincy Simmonds to discuss results. Patient agreeable.   Patient given post procedure instructions:  You may have a mild bladder and rectal discomfort for a few hours after the test. You may experience some frequent urination and slight burning the first few times you urinate after the test. Rarely, the urine may be blood tinged. These are both due to catheter placements and resolve quickly. You should call our office immediately if you have signs of infection, which may include bladder pain, urinary urgency, fever, or burning during urination. We do encourage you to drink plenty of water after the test.

## 2016-09-04 NOTE — Progress Notes (Signed)
Results of Urodynamics done on 08/20/16  Uroflow - continuous, void of 27 cc, PVR 25 cc. CMG - Stable.  S1:  255 cc, S2:  421 cc, S3:  589 cc.  VLPP 86 cm H20 at 610 cc.   UPP - 11 and 26 cmH2O. Pressure flow - Pdet max 67 cm H2O.  Voided volume - 562 cc.   Evidence of GSI.

## 2016-09-08 ENCOUNTER — Ambulatory Visit (INDEPENDENT_AMBULATORY_CARE_PROVIDER_SITE_OTHER): Payer: BLUE CROSS/BLUE SHIELD | Admitting: Obstetrics and Gynecology

## 2016-09-08 ENCOUNTER — Encounter: Payer: BLUE CROSS/BLUE SHIELD | Attending: Internal Medicine | Admitting: Dietician

## 2016-09-08 ENCOUNTER — Encounter: Payer: Self-pay | Admitting: Dietician

## 2016-09-08 ENCOUNTER — Encounter: Payer: Self-pay | Admitting: Obstetrics and Gynecology

## 2016-09-08 VITALS — BP 134/80 | HR 70 | Ht 63.75 in | Wt 156.8 lb

## 2016-09-08 DIAGNOSIS — Z713 Dietary counseling and surveillance: Secondary | ICD-10-CM | POA: Insufficient documentation

## 2016-09-08 DIAGNOSIS — E785 Hyperlipidemia, unspecified: Secondary | ICD-10-CM | POA: Insufficient documentation

## 2016-09-08 DIAGNOSIS — N812 Incomplete uterovaginal prolapse: Secondary | ICD-10-CM

## 2016-09-08 DIAGNOSIS — Z8742 Personal history of other diseases of the female genital tract: Secondary | ICD-10-CM

## 2016-09-08 DIAGNOSIS — N393 Stress incontinence (female) (male): Secondary | ICD-10-CM | POA: Diagnosis not present

## 2016-09-08 DIAGNOSIS — E119 Type 2 diabetes mellitus without complications: Secondary | ICD-10-CM | POA: Diagnosis not present

## 2016-09-08 DIAGNOSIS — E559 Vitamin D deficiency, unspecified: Secondary | ICD-10-CM | POA: Diagnosis not present

## 2016-09-08 NOTE — Progress Notes (Signed)
  Medical Nutrition Therapy:  Appt start time: 0937 end time:  0950.   Assessment:  Primary concerns today: Patient is here today with her husband.  Patient would like to learn what she needs to eat with diabetes to avoid medication.  Type 2 diabetes for around 2 years per patient but with gestational diabetes with each pregnancy (x3).  Other history includes hyperlipidemia, vitamin D deficiency, part of colon removed for pre cancerous polyps and she is to have a hysterectomy this month due to concerns of cancer.   Weight today 156 lbs increased from 130 lbs about 5 years ago.  Her last A1C was 6.6% 08/12/16.  She had a steroid injection in her knee 2 months ago.  Patient lives with her husband.  She does the shopping and cooking.  They own a real estate company.  Preferred Learning Style:   No preference indicated   Learning Readiness:   Ready  Change in progress   MEDICATIONS: see list   DIETARY INTAKE:  Usual eating pattern includes 2-3 meals and 1-2 snacks per day.  Everyday foods include diet coke.  Avoided foods include "not much bread".    24-hr recall:  B ( AM): [employer] Lean Bar (180 calories, 15 grams protein, 22 grams carbohydrates, 10 grams fiber)  Snk ( AM): none  L ( PM): leftover chicken, spinach salad with berries and walnuts OR [employer] OR [employer] BBQ Snk ( PM): occasional NABS D (8:30 PM): gilled meat, salad, sweet potato with butter or winter squash, occasional dessert Snk ( PM): none Beverages: 1-2 diet coke daily, coffee with cream and stevia, unsweetened tea, diet cran-grape every am to take medications.  Usual physical activity: None currently ("no time")  Estimated energy needs: 1400 calories 158 g carbohydrates 105 g protein 39 g fat  Progress Towards Goal(s):  In progress.   Nutritional Diagnosis:  NB-1.1 Food and nutrition-related knowledge deficit As related to balance of carbohydrate, protein and fat.  As evidenced by patient report.     Intervention:  Nutrition counseling and diabetes education initiated. Discussed Carb Counting by food group as method of portion control, reading food labels, and benefits of increased activity. Also discussed basic physiology of Diabetes, target BG ranges pre and post meals, and A1c. Also discussed eating out and use of Calorie [employer].  Consider Calorie [employer] app Be mindful about your choices especially when eating out. Eat slowly and stop when you are satisfied. Be as active as possible.  Aim for 30 minutes most days.  Aim for 3 Carb Choices per meal (45 grams) +/- 1 either way  Aim for 0-1 Carbs per snack if hungry  Include protein in moderation with your meals and snacks Consider reading food labels for Total Carbohydrate and Fat Grams of foods Consider checking BG at alternate times per day as directed by MD   Teaching Method Utilized:  Visual Auditory Hands on  Handouts given during visit include: Living Well with Diabetes Food Label handouts Meal Plan Card  My plate  Snack list  [id number] sheet  Barriers to learning/adherence to lifestyle change: none  Demonstrated degree of understanding via:  Teach Back   Monitoring/Evaluation:  Dietary intake, exercise, label reading, and body weight prn.

## 2016-09-08 NOTE — Patient Instructions (Addendum)
Consider Calorie [employer] Pace app Be mindful about your choices especially when eating out. Eat slowly and stop when you are satisfied. Be as active as possible.  Aim for 30 minutes most days.  Plan:  Aim for 3 Carb Choices per meal (45 grams) +/- 1 either way  Aim for 0-1 Carbs per snack if hungry  Include protein in moderation with your meals and snacks Consider reading food labels for Total Carbohydrate and Fat Grams of foods Consider checking BG at alternate times per day as directed by MD

## 2016-09-08 NOTE — Progress Notes (Signed)
GYNECOLOGY  VISIT   HPI: 60 y.o.   Married  Caucasian  female   [phone] with Patient's last menstrual period was 03/06/2006 (approximate).   here to discuss results of urodynamics.    Husband present for the entire visit today.   Patient is considering surgery for recurrent postmenopausal bleeding, prolapse, and incontinence.   Leaks urine with sneeze. Leaks for sure with exercise and jumping.  Wears a pad. No leak for no reason at all. Urge to empty first thing in the am. DF - every 3 -4 hours. NF - none. Takes a while to complete her voiding.  Goes on and on.  No painful urination or blood in the urine.  Does not know when she last had a UTI - "a long time." No hx renal stones.   Results of Urodynamics done on 08/20/16: Uroflow - continuous, void of 27 cc, PVR 25 cc. CMG - Stable.  S1:  255 cc, S2:  421 cc, S3:  589 cc.  VLPP 86 cm H20 at 610 cc.   UPP - 11 and 26 cmH2O. Pressure flow - Pdet max 67 cm H2O.  Voided volume - 562 cc.  Evidence of GSI.   No constipation.  Has always had IBS.  This is better now.  No splinting. When she has an IBS flare, has some fecal soiling.   This happened all the time and then changed a couple of months ago when she starting avoiding gluten.  Largest vaginal delivery was 6 pounds, 13 ounces.  One forceps delivery - 6 pounds.  No hx of extensive laceration through rectum.   Sexually active.  No pain or discomfort.  She does feel a bulge.  Hx recurrent postmenopausal bleeding.  Hx endometrial ablation, HTA in 2004. No prior testing to document menopause by hormonal testing.   Hadvaginal spotting - February 2017 and June 2017.  Not taking any hormonal therapy.   Pelvic ultrasound 05/10/15 for postmenopausal bleeding. Uterus - 2.7 cm fundal intramural fibroid. Cannot rule out encroachment on endometrial canal. EMS - 1.90 mm. Ovaries - normal Free fluid - no Sonohysterogram showed no filling defects and inadequate  fitting of the endometrial cavity. Possible encroachment of fibroid into the endometrium.  EMB at that time: Rare endocervical glands only. No endometrial tissue.   Ultrasound done 05/22/16: EMS 2.59 mm. 2 fibroids 0.76 and 2.62 cm, latter possibly encroaching on endometrium.  Normal ovaries.  No free fluid. No EMB done.  FSH 62.9 and estradiol 19 on 05/22/16.  Hx of right colectomy for precancerous lesion of the colon.   No preop visit yet.   GYNECOLOGIC HISTORY: Patient's last menstrual period was 03/06/2006 (approximate). Contraception:  Vasectomy/Postmenopausal Menopausal hormone therapy:  none Last mammogram:  02-27-16 Density C/Neg/BiRads1:TBC Last pap smear:  04-23-15 neg HPV HR neg         OB History    Gravida Para Term Preterm AB Living   3 3 3  0 0 3   SAB TAB Ectopic Multiple Live Births   0 0 0 0 3         Patient Active Problem List   Diagnosis Date Noted  . Diabetes ([place]) 08/12/2016  . Pure hypercholesterolemia 04/29/2016  . PMB (postmenopausal bleeding) 04/29/2016  . 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 Hematuria 04/29/2016  . Estrogen deficiency 04/29/2016  . Fibroid, uterine 04/29/2016  . Elevated LFTs 12/12/2011  . Vitamin D deficiency 01/29/2010  . Hyperlipidemia 01/21/2010  . SMOKER 01/21/2010  . Depression 01/21/2010  . ALLERGIC  RHINITIS 01/21/2010  . Personal history of colonic polyps 07/30/2006    Past Medical History:  Diagnosis Date  . ALLERGIC RHINITIS   . DEPRESSION   . Diabetes mellitus without complication ([place]) 123456  . HYPERLIPIDEMIA   . Personal history of colonic polyps   . SMOKER   . VITAMIN D DEFICIENCY     Past Surgical History:  Procedure Laterality Date  . BREAST SURGERY  1990   breast biopsy  . COLONOSCOPY W/ BIOPSIES  9/07   2 polyps & tubuvillous adenoma  . COLONOSCOPY W/ BIOPSIES  11/13/08   sigmoid ddiverticuli and rechek in 5 years - Dr. Collene Mares  . ENDOMETRIAL ABLATION  08/21/03   HTA  . 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 UPPER GASTROINTESTINAL ENDOSCOPY  06/15/06   2 polyps - H pylori    Current Outpatient Prescriptions  Medication Sig Dispense Refill  . Ascorbic Acid (VITAMIN C) 100 MG tablet Take 100 mg by mouth daily.    [name] aspirin 81 MG tablet Take 81 mg by mouth daily.      [name] azelastine (ASTELIN) 0.1 % nasal spray USE ONE SPRAY IN EACH NOSTRIL DAILY AS NEEDED 90 mL 0  . cetirizine (ZYRTEC) 10 MG tablet Take 10 mg by mouth daily.    [name] escitalopram (LEXAPRO) 10 MG tablet Take 1 tablet (10 mg total) by mouth daily. 90 tablet 4  . Multiple Vitamin (MULTIVITAMIN WITH MINERALS) TABS tablet Take 1 tablet by mouth daily.    . simvastatin (ZOCOR) 20 MG tablet Take 1 tablet by mouth daily at 6 PM ---- Needs appt for further refills 90 tablet 0  . vitamin B-12 (CYANOCOBALAMIN) 1000 MCG tablet Take 5,000 mcg by mouth daily.    . Vitamin D, Ergocalciferol, (DRISDOL) 50000 units CAPS capsule Take 50,000 Units by mouth every 7 (seven) days.     No current facility-administered medications for this visit.      ALLERGIES: Patient has no known allergies.  Family History  Problem Relation Age of Onset  . Breast cancer Mother 61  . Diabetes Mother   . Stroke Mother            . Hyperlipidemia Mother   . Heart failure Mother   . Hypertension Mother   . COPD Mother   . Hypertension Father   . Heart disease Father   . Hyperlipidemia Father   . Heart failure Father     dec at age 63  . Breast cancer Sister 60  . Heart disease Sister     Afib  . Psoriasis Son     Social History   Social History  . Marital status: Married    Spouse name: N/A  . Number of children: N/A  . Years of education: N/A   Occupational History  . Not on file.   Social History Main Topics  . Smoking status: Light Tobacco Smoker    Packs/day: 0.25    Years: 30.00    Types: Cigarettes    Last attempt to quit: 02/04/2015  . Smokeless tobacco: Never Used      Comment: Married, lives with spouse. Cares for elderly parent and in-laws  . Alcohol use No     Comment: Rare glass of wine  . Drug use: No  . Sexual activity: Yes    Partners: Male    Birth control/ protection: Other-see comments     Comment: husband had vasectomy  Other Topics Concern  . Not on file   Social History Narrative   Lives with husband in a one story home.  She has 3 grown children.     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     Education: high school.        No regular exercise    ROS:  Pertinent items are noted in HPI.  PHYSICAL EXAMINATION:    BP 134/80 (BP Location: Right Arm, Patient Position: Sitting, Cuff Size: Normal)   Pulse 70   Ht 5' 3.75" (1.619 m)   Wt 156 lb 12.8 oz (71.1 kg)   LMP 03/06/2006 (Approximate)   BMI 27.13 kg/m     General appearance: alert, cooperative and appears stated age   ASSESSMENT  Recurrent postmenopausal bleeding.  Hx HTA endometrial ablation. Two fibroids.  Fundal fibroid with probable encroachment on endometrial canal.  Atrophic appearing endometrium.  Nondiagnostic endometrial biopsy previously when endometrium was atrophic and measured 1.9 mm.  EMB now 2.59 mm. Incomplete uterovaginal prolapse.  Genuine stress incontinence. Status post right hemicolectomy for high grade dysplasia. DM.  HgbA1C 6.6.  PCP following and not recommendation medication at this time.   PLAN  Comprehensive discussion regarding postmenopausal bleeding, uterovaginal prolapse and incontinence and surgical care thereof.   We discussed endometrial ablation effect on endometrium and resulting nondiagnostic EMB. We discussed dilation and curettage as means of further sampling endometrium and possibility of nondiagnostic specimen in this circumstance.  She declines this. She understands there is a rare possibility of endometrial cancer which would necessitate further surgery and care if her hysterectomy specimen demonstrates this.   We discussed  potential adhesive disease due to her prior colon surgery.   We have discussed a comprehensive approach to surgical care including a total abdominal hysterectomy with bilateral salpingo-oophorectomy, abdominosacrocolpopexy, Halban's culdoplasty, anterior and posterior colporrhaphy, TVT Exact midurethral sling, and cystoscopy.   Risks, benefits, and alternatives of surgery are reviewed.  Risks of surgery include but are not limited tobleeding, infection, damage to surrounding organs, ureteral damage, vaginal pain with sexual activity, permanent mesh use which may cause erosion and exposure in the vagina, urethra, bladder or ureters, dyspareunia, slower voiding and urinary retention, de novo overactive bladder symptoms, reoperation, recurrence of prolapse and incontinence, DVT, PE, death, pneumonia, and reaction to anesthesia.     Surgical procedure with recovery expectations discussed.  She wishes to proceed.    She will return for a preop visit.   Anesthesia consult also planned.  She will do an enema the night before surgery.  An After Visit Summary was printed and given to the patient.  __25____ minutes face to face time of which over 50% was spent in counseling.

## 2016-09-12 ENCOUNTER — Other Ambulatory Visit: Payer: Self-pay | Admitting: Internal Medicine

## 2016-09-19 NOTE — Patient Instructions (Addendum)
Your procedure is scheduled on:  Thursday, Dec. 28, 2017  Enter through the [employer] [facility] at:  6:00 AM  Pick up the phone at the desk and dial [phone].  Call this number if you have problems the morning of surgery: [phone].  Remember: Do NOT eat food or drink after: Midnight Wednesday, Dec. 27, 2017  Take these medicines the morning of surgery with a SIP OF WATER:  None  Stop ALL herbal medications at this time   Do NOT wear jewelry (body piercing), metal hair clips/bobby pins, make-up, or nail polish. Do NOT wear lotions, powders, or perfumes.  You may wear deodorant. Do NOT shave for 48 hours prior to surgery. Do NOT bring valuables to the hospital. Contacts, dentures, or bridgework may not be worn into surgery.  Leave suitcase in car.  After surgery it may be brought to your room.  For patients admitted to the hospital, checkout time is 11:00 AM the day of discharge.

## 2016-09-22 ENCOUNTER — Encounter: Payer: Self-pay | Admitting: Obstetrics and Gynecology

## 2016-09-22 ENCOUNTER — Ambulatory Visit (INDEPENDENT_AMBULATORY_CARE_PROVIDER_SITE_OTHER): Payer: BLUE CROSS/BLUE SHIELD | Admitting: Obstetrics and Gynecology

## 2016-09-22 ENCOUNTER — Other Ambulatory Visit: Payer: Self-pay

## 2016-09-22 ENCOUNTER — Encounter (HOSPITAL_COMMUNITY): Payer: Self-pay

## 2016-09-22 ENCOUNTER — Encounter (HOSPITAL_COMMUNITY)
Admission: RE | Admit: 2016-09-22 | Discharge: 2016-09-22 | Disposition: A | Discharge status: Home / Self Care | Payer: BLUE CROSS/BLUE SHIELD | Source: Ambulatory Visit | Attending: Obstetrics and Gynecology | Admitting: Obstetrics and Gynecology

## 2016-09-22 VITALS — BP 118/76 | HR 76 | Resp 16 | Ht 63.0 in | Wt 156.0 lb

## 2016-09-22 DIAGNOSIS — N393 Stress incontinence (female) (male): Secondary | ICD-10-CM

## 2016-09-22 DIAGNOSIS — Z01812 Encounter for preprocedural laboratory examination: Secondary | ICD-10-CM | POA: Diagnosis not present

## 2016-09-22 DIAGNOSIS — N812 Incomplete uterovaginal prolapse: Secondary | ICD-10-CM | POA: Diagnosis not present

## 2016-09-22 DIAGNOSIS — Z01810 Encounter for preprocedural cardiovascular examination: Secondary | ICD-10-CM | POA: Insufficient documentation

## 2016-09-22 HISTORY — DX: Unspecified osteoarthritis, unspecified site: M19.90

## 2016-09-22 LAB — COMPREHENSIVE METABOLIC PANEL
ALT: 25 U/L (ref 14–54)
AST: 24 U/L (ref 15–41)
Albumin: 4.5 g/dL (ref 3.5–5.0)
Alkaline Phosphatase: 58 U/L (ref 38–126)
Anion gap: 8 (ref 5–15)
BUN: 18 mg/dL (ref 6–20)
CO2: 26 mmol/L (ref 22–32)
Calcium: 9.2 mg/dL (ref 8.9–10.3)
Chloride: 106 mmol/L (ref 101–111)
Creatinine, Ser: 0.54 mg/dL (ref 0.44–1.00)
GFR calc Af Amer: >60 mL/min (ref >60)
GFR calc non Af Amer: >60 mL/min (ref >60)
Glucose, Bld: 162 mg/dL — ABNORMAL HIGH (ref 65–99)
Potassium: 3.7 mmol/L (ref 3.5–5.1)
Sodium: 140 mmol/L (ref 135–145)
Total Bilirubin: 0.4 mg/dL (ref 0.3–1.2)
Total Protein: 7.7 g/dL (ref 6.5–8.1)

## 2016-09-22 LAB — TYPE AND SCREEN
ABO/RH(D): A NEG
Antibody Screen: NEGATIVE

## 2016-09-22 LAB — CBC
HCT: 41.8 % (ref 36.0–46.0)
Hemoglobin: 14.3 g/dL (ref 12.0–15.0)
MCH: 32.4 pg (ref 26.0–34.0)
MCHC: 34.2 g/dL (ref 30.0–36.0)
MCV: 94.6 fL (ref 78.0–100.0)
Platelets: 309 10*3/uL (ref 150–400)
RBC: 4.42 MIL/uL (ref 3.87–5.11)
RDW: 12.3 % (ref 11.5–15.5)
WBC: 8.1 10*3/uL (ref 4.0–10.5)

## 2016-09-22 LAB — ABO/RH: ABO/RH(D): A NEG

## 2016-09-22 NOTE — Patient Instructions (Signed)
At your pharmacy, you will need to get: - Fleets enema. - Miralax - Colace stool softeners - Sanitary pads

## 2016-09-22 NOTE — Progress Notes (Signed)
GYNECOLOGY  VISIT   HPI: 60 y.o.   Married  Caucasian  female   [phone] with Patient's last menstrual period was 03/06/2006 (approximate).   here for  Surgery consult - HYSTERECTOMY ABDOMINAL, SALPINGO OOPHORECTOMY, ABDOMINO SACROCOLPOPEXY with Halban's culdoplasty, ANTERIOR (CYSTOCELE) AND POSTERIOR REPAIR (RECTOCELE), TRANSVAGINAL TAPE (TVT) PROCEDURE exact midurethral sling, General CYSTOSCOPY    Patient is scheduled for surgery for recurrent postmenopausal bleeding, prolapse, and incontinence.   Leaks urine with sneeze. Leaks for sure with exercise and jumping.  Wears a pad. No leak for no reason at all. Urge to empty first thing in the am. DF - every 3 - 4 hours. NF - none. Takes a while to complete her voiding. Goes on and on.  No painful urination or blood in the urine.  Does not know when she last had a UTI - "a long time." No hx renal stones.   Results of Urodynamics done on 08/20/16: Uroflow - continuous, void of 27 cc, PVR 25 cc. CMG - Stable. S1: 255 cc, S2: 421 cc, S3: 589 cc. VLPP 86 cm H20 at 610 cc.  UPP - 11 and 26 cmH2O. Pressure flow - Pdet max 67 cm H2O. Voided volume - 562 cc.  Evidence of GSI.   No constipation.  Has always had IBS. This is better now.  No splinting. When she has an IBS flare, has some fecal soiling.  This happened all the time and then changed a couple of months ago when she starting avoiding gluten.  Largest vaginal delivery was 6 pounds, 13 ounces.  One forceps delivery - 6 pounds.  No hx of extensive laceration through rectum.   Sexually active.  No pain or discomfort.  She does feel a bulge.  Hx recurrent postmenopausal bleeding.  Hx endometrial ablation, HTA in 2004. No prior testing to document menopause by hormonal testing.   Hadvaginal spotting - February 2017 and June 2017.  Not taking any hormonal therapy.   Pelvic ultrasound 05/10/15 for postmenopausal bleeding. Uterus - 2.7 cm fundal  intramural fibroid. Cannot rule out encroachment on endometrial canal. EMS - 1.90 mm. Ovaries - normal Free fluid - no Sonohysterogram showed no filling defects and inadequate fitting of the endometrial cavity. Possible encroachment of fibroid into the endometrium.  EMB at that time: Rare endocervical glands only. No endometrial tissue.   Ultrasound done 05/22/16: EMS 2.59 mm. 2 fibroids 0.76 and 2.62 cm, latter possibly encroaching on endometrium.  Normal ovaries.  No free fluid. No EMB done.  FSH 62.9 and estradiol 19 on 05/22/16.  Hx of right colectomy for precancerous lesion of the colon.   GYNECOLOGIC HISTORY: Patient's last menstrual period was 03/06/2006 (approximate). Contraception:  Vasectomy/Postmenopausal Menopausal hormone therapy:  none Last mammogram:  02-27-16 Density C/Neg/BiRads1:TBC5-24-17 Density C/Neg/BiRads1:TBC Last pap smear:   04-23-15 neg HPV HR neg         OB History    Gravida Para Term Preterm AB Living   3 3 3  0 0 3   SAB TAB Ectopic Multiple Live Births   0 0 0 0 3         Patient Active Problem List   Diagnosis Date Noted  . Diabetes ([place]) 08/12/2016  . Pure hypercholesterolemia 04/29/2016  . PMB (postmenopausal bleeding) 04/29/2016  . 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 Hematuria 04/29/2016  . Estrogen deficiency 04/29/2016  . Fibroid, uterine 04/29/2016  . Elevated LFTs 12/12/2011  . Vitamin D deficiency 01/29/2010  . Hyperlipidemia 01/21/2010  . SMOKER 01/21/2010  . Depression  01/21/2010  . ALLERGIC RHINITIS 01/21/2010  . Personal history of colonic polyps 07/30/2006    Past Medical History:  Diagnosis Date  . ALLERGIC RHINITIS   . Arthritis    hands  . DEPRESSION   . Diabetes mellitus without complication ([place]) 123456   diet controlled  . HYPERLIPIDEMIA   . Personal history of colonic polyps   . SMOKER   . VITAMIN D DEFICIENCY     Past Surgical History:  Procedure Laterality Date  . BREAST SURGERY  1990   breast biopsy  .  COLONOSCOPY W/ BIOPSIES  9/07   2 polyps & tubuvillous adenoma  . COLONOSCOPY W/ BIOPSIES  11/13/08   sigmoid ddiverticuli and rechek in 5 years - Dr. Collene Mares  . ENDOMETRIAL ABLATION  08/21/03   HTA  . 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 UPPER GASTROINTESTINAL ENDOSCOPY  06/15/06   2 polyps - H pylori    Current Outpatient Prescriptions  Medication Sig Dispense Refill  . aspirin 81 MG tablet Take 81 mg by mouth daily.      [name] azelastine (ASTELIN) 0.1 % nasal spray USE ONE SPRAY IN EACH NOSTRIL DAILY AS NEEDED (Patient taking differently: Place 1 spray into both nostrils daily. ) 90 mL 0  . cetirizine (ZYRTEC) 10 MG tablet Take 10 mg by mouth daily as needed for allergies.     [name] escitalopram (LEXAPRO) 10 MG tablet Take 1 tablet (10 mg total) by mouth daily. 90 tablet 4  . OVER THE COUNTER MEDICATION Take 2 capsules by mouth daily. Herbal supplement for sugar regulation    . simvastatin (ZOCOR) 20 MG tablet Take 1 tablet by mouth daily at 6 PM ---- Needs appt for further refills 90 tablet 0  . vitamin B-12 (CYANOCOBALAMIN) 1000 MCG tablet Take 5,000 mcg by mouth daily.    . Vitamin D, Ergocalciferol, (DRISDOL) 50000 units CAPS capsule Take 50,000 Units by mouth every 7 (seven) days. mondays     No current facility-administered medications for this visit.      ALLERGIES: Patient has no known allergies.  Family History  Problem Relation Age of Onset  . Breast cancer Mother 73  . Diabetes Mother   . Stroke Mother            . Hyperlipidemia Mother   . Heart failure Mother   . Hypertension Mother   . COPD Mother   . Hypertension Father   . Heart disease Father   . Hyperlipidemia Father   . Heart failure Father     dec at age 58  . Breast cancer Sister 29  . Heart disease Sister     Afib  . Psoriasis Son     Social History   Social History  . Marital status: Married    Spouse name: N/A  . Number of children: N/A   . Years of education: N/A   Occupational History  . Not on file.   Social History Main Topics  . Smoking status: Light Tobacco Smoker    Packs/day: 0.30    Years: 30.00    Types: Cigarettes    Last attempt to quit: 02/04/2015  . Smokeless tobacco: Never Used     Comment: Married, lives with spouse. Cares for elderly parent and in-laws  . Alcohol use 0.0 oz/week     Comment: Rare glass of wine  . Drug use: No  . Sexual activity: Yes    Partners: Male  Birth control/ protection: Other-see comments     Comment: husband had vasectomy   Other Topics Concern  . Not on file   Social History Narrative   Lives with husband in a one story home.  She has 3 grown children.     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     Education: high school.        No regular exercise    ROS:  Pertinent items are noted in HPI.  PHYSICAL EXAMINATION:    BP 118/76 (BP Location: Right Arm, Patient Position: Sitting, Cuff Size: Normal)   Pulse 76   Resp 16   Ht 5\' 3"  (1.6 m)   Wt 156 lb (70.8 kg)   LMP 03/06/2006 (Approximate)   BMI 27.63 kg/m     General appearance: alert, cooperative and appears stated age Head: Normocephalic, without obvious abnormality, atraumatic Neck: no adenopathy, supple, symmetrical, trachea midline and thyroid normal to inspection and palpation Lungs: clear to auscultation bilaterally Breasts: normal appearance, no masses or tenderness, No nipple retraction or dimpling, No nipple discharge or bleeding, No axillary or supraclavicular adenopathy Heart: regular rate and rhythm Abdomen: right mid abdomen transverse incision, soft, non-tender, no masses,  no organomegaly Extremities: extremities normal, atraumatic, no cyanosis or edema Skin: Skin color, texture, turgor normal. No rashes or lesions Lymph nodes: Cervical, supraclavicular, and axillary nodes normal. No abnormal inguinal nodes palpated Neurologic: Grossly normal  Pelvic: External genitalia:  no lesions               Urethra:  normal appearing urethra with no masses, tenderness or lesions              Bartholins and Skenes: normal                 Vagina: normal appearing vagina with normal color and discharge, no lesions.  Second degree cystocele, first degree uterine prolapse, second degree rectocele.               Cervix: no lesions                Bimanual Exam:  Uterus:  normal size, contour, position, consistency, mobility, non-tender              Adnexa: no mass, fullness, tenderness              Rectal exam: Yes.  .  Confirms.              Anus:  normal sphincter tone, no lesions  Chaperone was present for exam.  ASSESSMENT  Recurrent postmenopausal bleeding.  Hx HTA endometrial ablation. Two fibroids.  Fundal fibroid with probable encroachment on endometrial canal.  Atrophic appearing endometrium.  Nondiagnostic endometrial biopsy previously when endometrium was atrophic and measured 1.9 mm.  EMS now 2.59 mm. Incomplete uterovaginal prolapse.  Genuine stress incontinence. Status post right hemicolectomy for high grade dysplasia. DM.  HgbA1C 6.6.  PCP following and not recommendation medication at this time.  PLAN  Plan for abdominal hysterectomy with bilateral salpingo-oophorectomy, abdominosacrocolpopexy, Halban's culdoplasty, anterior and posterior colporrhaphy, TVT Exact midurethral sling, and cystoscopy.  Risks, benefits, and alternatives discussed with the patient who wishes to proceed.   An After Visit Summary was printed and given to the patient.

## 2016-09-25 ENCOUNTER — Encounter: Payer: Self-pay | Admitting: Obstetrics and Gynecology

## 2016-10-01 ENCOUNTER — Encounter (HOSPITAL_COMMUNITY): Payer: Self-pay | Admitting: Anesthesiology

## 2016-10-01 ENCOUNTER — Encounter: Payer: Self-pay | Admitting: Obstetrics and Gynecology

## 2016-10-01 MED ORDER — CEFOTETAN DISODIUM 2 G IJ SOLR
2.0000 g | INTRAMUSCULAR | Status: AC
  Administered 2016-10-02: 2 g via INTRAVENOUS
  Filled 2016-10-01: qty 2

## 2016-10-01 MED ORDER — ENOXAPARIN SODIUM 40 MG/0.4ML ~~LOC~~ SOLN
40.0000 mg | SUBCUTANEOUS | Status: AC
  Administered 2016-10-02: 40 mg via SUBCUTANEOUS
  Filled 2016-10-01: qty 0.4

## 2016-10-01 NOTE — Anesthesia Preprocedure Evaluation (Addendum)
Anesthesia Evaluation  Patient identified by MRN, date of birth, ID band Patient awake    Reviewed: Allergy & Precautions, NPO status , Patient's Chart, lab work & pertinent test results  History of Anesthesia Complications Negative for: history of anesthetic complications  Airway Mallampati: II  TM Distance: >3 FB Neck ROM: Full    Dental no notable dental hx. (+) Dental Advisory Given   Pulmonary Current Smoker,    Pulmonary exam normal        Cardiovascular negative cardio ROS Normal cardiovascular exam     Neuro/Psych PSYCHIATRIC DISORDERS Depression negative neurological ROS     GI/Hepatic Neg liver ROS, Hx/o colon polyps   Endo/Other  diabetes, Poorly Controlled, Type 2  Renal/GU negative Renal ROS Bladder dysfunction  SUI    Musculoskeletal  (+) Arthritis ,   Abdominal   Peds  Hematology   Anesthesia Other Findings   Reproductive/Obstetrics Post Menopausal Bleeding Fibroid uterus Vaginal prolapse                            Anesthesia Physical Anesthesia Plan  ASA: II  Anesthesia Plan: General   Post-op Pain Management:    Induction: Intravenous  Airway Management Planned: Oral ETT  Additional Equipment:   Intra-op Plan:   Post-operative Plan: Extubation in OR  Informed Consent: I have reviewed the patients History and Physical, chart, labs and discussed the procedure including the risks, benefits and alternatives for the proposed anesthesia with the patient or authorized representative who has indicated his/her understanding and acceptance.   Dental advisory given  Plan Discussed with: Anesthesiologist, CRNA and Surgeon  Anesthesia Plan Comments:         Anesthesia Quick Evaluation

## 2016-10-02 ENCOUNTER — Encounter (HOSPITAL_COMMUNITY)
Admission: RE | Disposition: A | Discharge status: Home / Self Care | Payer: Self-pay | Source: Ambulatory Visit | Attending: Obstetrics and Gynecology

## 2016-10-02 ENCOUNTER — Inpatient Hospital Stay (HOSPITAL_COMMUNITY)
Admission: RE | Admit: 2016-10-02 | Discharge: 2016-10-04 | DRG: 743 | Disposition: A | Discharge status: Home / Self Care | Payer: BLUE CROSS/BLUE SHIELD | Source: Ambulatory Visit | Attending: Obstetrics and Gynecology | Admitting: Obstetrics and Gynecology

## 2016-10-02 ENCOUNTER — Inpatient Hospital Stay (HOSPITAL_COMMUNITY): Payer: BLUE CROSS/BLUE SHIELD | Admitting: Anesthesiology

## 2016-10-02 ENCOUNTER — Encounter (HOSPITAL_COMMUNITY): Payer: Self-pay | Admitting: *Deleted

## 2016-10-02 DIAGNOSIS — N812 Incomplete uterovaginal prolapse: Principal | ICD-10-CM | POA: Diagnosis present

## 2016-10-02 DIAGNOSIS — D251 Intramural leiomyoma of uterus: Secondary | ICD-10-CM | POA: Diagnosis present

## 2016-10-02 DIAGNOSIS — N950 Postmenopausal bleeding: Secondary | ICD-10-CM | POA: Diagnosis present

## 2016-10-02 DIAGNOSIS — N393 Stress incontinence (female) (male): Secondary | ICD-10-CM | POA: Diagnosis not present

## 2016-10-02 DIAGNOSIS — Z90722 Acquired absence of ovaries, bilateral: Secondary | ICD-10-CM

## 2016-10-02 DIAGNOSIS — K589 Irritable bowel syndrome without diarrhea: Secondary | ICD-10-CM | POA: Diagnosis present

## 2016-10-02 DIAGNOSIS — N800 Endometriosis of uterus: Secondary | ICD-10-CM | POA: Diagnosis present

## 2016-10-02 DIAGNOSIS — Z9079 Acquired absence of other genital organ(s): Secondary | ICD-10-CM

## 2016-10-02 DIAGNOSIS — Z7982 Long term (current) use of aspirin: Secondary | ICD-10-CM | POA: Diagnosis not present

## 2016-10-02 DIAGNOSIS — E119 Type 2 diabetes mellitus without complications: Secondary | ICD-10-CM | POA: Diagnosis present

## 2016-10-02 DIAGNOSIS — Z90710 Acquired absence of both cervix and uterus: Secondary | ICD-10-CM

## 2016-10-02 DIAGNOSIS — F17210 Nicotine dependence, cigarettes, uncomplicated: Secondary | ICD-10-CM | POA: Diagnosis present

## 2016-10-02 HISTORY — PX: ABDOMINAL SACROCOLPOPEXY: SHX1114

## 2016-10-02 HISTORY — PX: SALPINGOOPHORECTOMY: SHX82

## 2016-10-02 HISTORY — PX: ANTERIOR AND POSTERIOR REPAIR: SHX5121

## 2016-10-02 HISTORY — PX: CYSTOSCOPY: SHX5120

## 2016-10-02 HISTORY — PX: BLADDER SUSPENSION: SHX72

## 2016-10-02 HISTORY — PX: ABDOMINAL HYSTERECTOMY: SHX81

## 2016-10-02 LAB — GLUCOSE, CAPILLARY
Glucose-Capillary: 119 mg/dL — ABNORMAL HIGH (ref 65–99)
Glucose-Capillary: 171 mg/dL — ABNORMAL HIGH (ref 65–99)

## 2016-10-02 LAB — CREATININE, SERUM
Creatinine, Ser: 0.73 mg/dL (ref 0.44–1.00)
GFR calc Af Amer: >60 mL/min (ref >60)
GFR calc non Af Amer: >60 mL/min (ref >60)

## 2016-10-02 LAB — CBC
HCT: 39 % (ref 36.0–46.0)
Hemoglobin: 13.5 g/dL (ref 12.0–15.0)
MCH: 32.1 pg (ref 26.0–34.0)
MCHC: 34.6 g/dL (ref 30.0–36.0)
MCV: 92.9 fL (ref 78.0–100.0)
Platelets: 245 10*3/uL (ref 150–400)
RBC: 4.2 MIL/uL (ref 3.87–5.11)
RDW: 12.2 % (ref 11.5–15.5)
WBC: 13.5 10*3/uL — ABNORMAL HIGH (ref 4.0–10.5)

## 2016-10-02 SURGERY — HYSTERECTOMY, ABDOMINAL
Anesthesia: General | Site: Vagina

## 2016-10-02 MED ORDER — LORATADINE 10 MG PO TABS
10.0000 mg | ORAL_TABLET | Freq: Every day | ORAL | Status: DC
  Administered 2016-10-02 – 2016-10-03 (×2): 10 mg via ORAL
  Filled 2016-10-02 (×3): qty 1

## 2016-10-02 MED ORDER — DIPHENHYDRAMINE HCL 50 MG/ML IJ SOLN: 12.5000 mg | Freq: Four times a day (QID) | INTRAMUSCULAR | Status: DC | PRN

## 2016-10-02 MED ORDER — DEXAMETHASONE SODIUM PHOSPHATE 4 MG/ML IJ SOLN
INTRAMUSCULAR | Status: DC | PRN
  Administered 2016-10-02: 10 mg via INTRAVENOUS

## 2016-10-02 MED ORDER — LIDOCAINE-EPINEPHRINE 1 %-1:100000 IJ SOLN
INTRAMUSCULAR | Status: DC | PRN
  Administered 2016-10-02: 10 mL

## 2016-10-02 MED ORDER — SCOPOLAMINE 1 MG/3DAYS TD PT72
MEDICATED_PATCH | TRANSDERMAL | Status: DC | PRN
  Administered 2016-10-02: 1 via TRANSDERMAL

## 2016-10-02 MED ORDER — LACTATED RINGERS IV SOLN: INTRAVENOUS | Status: DC

## 2016-10-02 MED ORDER — BUPIVACAINE-EPINEPHRINE (PF) 0.25% -1:200000 IJ SOLN
INTRAMUSCULAR | Status: AC
  Filled 2016-10-02: qty 30

## 2016-10-02 MED ORDER — PROPOFOL 10 MG/ML IV BOLUS
INTRAVENOUS | Status: DC | PRN
  Administered 2016-10-02: 30 mg via INTRAVENOUS
  Administered 2016-10-02: 170 mg via INTRAVENOUS

## 2016-10-02 MED ORDER — FENTANYL CITRATE (PF) 250 MCG/5ML IJ SOLN
INTRAMUSCULAR | Status: AC
Start: 2016-10-02 — End: 2016-10-02
  Filled 2016-10-02: qty 5

## 2016-10-02 MED ORDER — IBUPROFEN 600 MG PO TABS
600.0000 mg | ORAL_TABLET | Freq: Four times a day (QID) | ORAL | Status: DC | PRN
  Administered 2016-10-03 – 2016-10-04 (×3): 600 mg via ORAL
  Filled 2016-10-02 (×3): qty 1

## 2016-10-02 MED ORDER — NEOSTIGMINE METHYLSULFATE 10 MG/10ML IV SOLN
INTRAVENOUS | Status: AC
  Filled 2016-10-02: qty 1

## 2016-10-02 MED ORDER — DEXAMETHASONE SODIUM PHOSPHATE 10 MG/ML IJ SOLN
INTRAMUSCULAR | Status: AC
  Filled 2016-10-02: qty 1

## 2016-10-02 MED ORDER — EPHEDRINE SULFATE 50 MG/ML IJ SOLN
INTRAMUSCULAR | Status: DC | PRN
  Administered 2016-10-02 (×2): 5 mg via INTRAVENOUS

## 2016-10-02 MED ORDER — LIDOCAINE HCL (CARDIAC) 20 MG/ML IV SOLN
INTRAVENOUS | Status: AC
  Filled 2016-10-02: qty 5

## 2016-10-02 MED ORDER — ROCURONIUM BROMIDE 100 MG/10ML IV SOLN
INTRAVENOUS | Status: AC
  Filled 2016-10-02: qty 1

## 2016-10-02 MED ORDER — ESCITALOPRAM OXALATE 10 MG PO TABS
10.0000 mg | ORAL_TABLET | Freq: Every day | ORAL | Status: DC
  Administered 2016-10-02 – 2016-10-03 (×2): 10 mg via ORAL
  Filled 2016-10-02 (×3): qty 1

## 2016-10-02 MED ORDER — OXYCODONE-ACETAMINOPHEN 5-325 MG PO TABS: 1.0000 | ORAL_TABLET | ORAL | Status: DC | PRN

## 2016-10-02 MED ORDER — FENTANYL CITRATE (PF) 100 MCG/2ML IJ SOLN
INTRAMUSCULAR | Status: DC | PRN
  Administered 2016-10-02 (×5): 50 ug via INTRAVENOUS

## 2016-10-02 MED ORDER — GLYCOPYRROLATE 0.2 MG/ML IJ SOLN
INTRAMUSCULAR | Status: DC | PRN
  Administered 2016-10-02 (×2): 0.1 mg via INTRAVENOUS

## 2016-10-02 MED ORDER — KETOROLAC TROMETHAMINE 30 MG/ML IJ SOLN
INTRAMUSCULAR | Status: DC | PRN
  Administered 2016-10-02: 30 mg via INTRAVENOUS

## 2016-10-02 MED ORDER — DIPHENHYDRAMINE HCL 12.5 MG/5ML PO ELIX: 12.5000 mg | ORAL_SOLUTION | Freq: Four times a day (QID) | ORAL | Status: DC | PRN

## 2016-10-02 MED ORDER — ONDANSETRON HCL 4 MG/2ML IJ SOLN: 4.0000 mg | Freq: Four times a day (QID) | INTRAMUSCULAR | Status: DC | PRN

## 2016-10-02 MED ORDER — PHENYLEPHRINE HCL 10 MG/ML IJ SOLN
INTRAMUSCULAR | Status: DC | PRN
  Administered 2016-10-02 (×4): 40 ug via INTRAVENOUS

## 2016-10-02 MED ORDER — AZELASTINE HCL 0.1 % NA SOLN
1.0000 | Freq: Every day | NASAL | Status: DC
  Filled 2016-10-02: qty 30

## 2016-10-02 MED ORDER — SODIUM CHLORIDE 0.9% FLUSH: 9.0000 mL | INTRAVENOUS | Status: DC | PRN

## 2016-10-02 MED ORDER — HYDROMORPHONE HCL 1 MG/ML IJ SOLN
INTRAMUSCULAR | Status: DC | PRN
  Administered 2016-10-02: 0.5 mg via INTRAVENOUS
  Administered 2016-10-02: 1 mg via INTRAVENOUS
  Administered 2016-10-02 (×3): 0.5 mg via INTRAVENOUS

## 2016-10-02 MED ORDER — METOCLOPRAMIDE HCL 5 MG/ML IJ SOLN
INTRAMUSCULAR | Status: AC
Start: 2016-10-02 — End: 2016-10-02
  Filled 2016-10-02: qty 2

## 2016-10-02 MED ORDER — PROPOFOL 10 MG/ML IV BOLUS
INTRAVENOUS | Status: AC
  Filled 2016-10-02: qty 20

## 2016-10-02 MED ORDER — STERILE WATER FOR IRRIGATION IR SOLN
Status: DC | PRN
  Administered 2016-10-02: 300 mL via INTRAVESICAL

## 2016-10-02 MED ORDER — ROCURONIUM BROMIDE 100 MG/10ML IV SOLN
INTRAVENOUS | Status: AC
Start: 2016-10-02 — End: 2016-10-02
  Filled 2016-10-02: qty 1

## 2016-10-02 MED ORDER — MENTHOL 3 MG MT LOZG: 1.0000 | LOZENGE | OROMUCOSAL | Status: DC | PRN

## 2016-10-02 MED ORDER — MIDAZOLAM HCL 2 MG/2ML IJ SOLN
INTRAMUSCULAR | Status: AC
  Filled 2016-10-02: qty 2

## 2016-10-02 MED ORDER — ONDANSETRON HCL 4 MG/2ML IJ SOLN
INTRAMUSCULAR | Status: DC | PRN
  Administered 2016-10-02: 4 mg via INTRAVENOUS

## 2016-10-02 MED ORDER — ONDANSETRON HCL 4 MG/2ML IJ SOLN
INTRAMUSCULAR | Status: AC
  Filled 2016-10-02: qty 2

## 2016-10-02 MED ORDER — LIDOCAINE HCL (CARDIAC) 20 MG/ML IV SOLN
INTRAVENOUS | Status: DC | PRN
  Administered 2016-10-02: 80 mg via INTRAVENOUS

## 2016-10-02 MED ORDER — EPHEDRINE 5 MG/ML INJ
INTRAVENOUS | Status: AC
  Filled 2016-10-02: qty 10

## 2016-10-02 MED ORDER — SUGAMMADEX SODIUM 200 MG/2ML IV SOLN
INTRAVENOUS | Status: DC | PRN
  Administered 2016-10-02: 141.6 mg via INTRAVENOUS

## 2016-10-02 MED ORDER — MORPHINE SULFATE 2 MG/ML IV SOLN
INTRAVENOUS | Status: DC
  Administered 2016-10-02: 15:00:00 via INTRAVENOUS
  Administered 2016-10-02: 6 mg via INTRAVENOUS
  Filled 2016-10-02: qty 25

## 2016-10-02 MED ORDER — METOCLOPRAMIDE HCL 5 MG/ML IJ SOLN
INTRAMUSCULAR | Status: DC | PRN
  Administered 2016-10-02 (×2): 5 mg via INTRAVENOUS

## 2016-10-02 MED ORDER — ESTRADIOL 0.1 MG/GM VA CREA
TOPICAL_CREAM | VAGINAL | Status: AC
  Filled 2016-10-02: qty 42.5

## 2016-10-02 MED ORDER — SCOPOLAMINE 1 MG/3DAYS TD PT72
MEDICATED_PATCH | TRANSDERMAL | Status: AC
  Filled 2016-10-02: qty 1

## 2016-10-02 MED ORDER — HYDROMORPHONE HCL 1 MG/ML IJ SOLN
0.2500 mg | INTRAMUSCULAR | Status: DC | PRN
  Administered 2016-10-02: 0.25 mg via INTRAVENOUS

## 2016-10-02 MED ORDER — SIMVASTATIN 20 MG PO TABS
20.0000 mg | ORAL_TABLET | Freq: Every day | ORAL | Status: DC
  Administered 2016-10-02 – 2016-10-03 (×2): 20 mg via ORAL
  Filled 2016-10-02 (×2): qty 1

## 2016-10-02 MED ORDER — LIDOCAINE-EPINEPHRINE 1 %-1:100000 IJ SOLN
INTRAMUSCULAR | Status: AC
Start: 2016-10-02 — End: 2016-10-02
  Filled 2016-10-02: qty 1

## 2016-10-02 MED ORDER — KETOROLAC TROMETHAMINE 15 MG/ML IJ SOLN
15.0000 mg | Freq: Four times a day (QID) | INTRAMUSCULAR | Status: AC
  Administered 2016-10-02 – 2016-10-03 (×5): 15 mg via INTRAVENOUS
  Filled 2016-10-02 (×5): qty 1

## 2016-10-02 MED ORDER — PHENYLEPHRINE 40 MCG/ML (10ML) SYRINGE FOR IV PUSH (FOR BLOOD PRESSURE SUPPORT)
PREFILLED_SYRINGE | INTRAVENOUS | Status: AC
  Filled 2016-10-02: qty 10

## 2016-10-02 MED ORDER — ESTRADIOL 0.1 MG/GM VA CREA
TOPICAL_CREAM | VAGINAL | Status: DC | PRN
  Administered 2016-10-02: 1 via VAGINAL

## 2016-10-02 MED ORDER — HYDROMORPHONE HCL 1 MG/ML IJ SOLN
INTRAMUSCULAR | Status: AC
  Filled 2016-10-02: qty 1

## 2016-10-02 MED ORDER — ENOXAPARIN SODIUM 40 MG/0.4ML ~~LOC~~ SOLN
40.0000 mg | SUBCUTANEOUS | Status: DC
  Administered 2016-10-03: 40 mg via SUBCUTANEOUS
  Filled 2016-10-02 (×2): qty 0.4

## 2016-10-02 MED ORDER — MIDAZOLAM HCL 2 MG/2ML IJ SOLN
INTRAMUSCULAR | Status: DC | PRN
  Administered 2016-10-02 (×2): 1 mg via INTRAVENOUS

## 2016-10-02 MED ORDER — LACTATED RINGERS IV SOLN
INTRAVENOUS | Status: DC
  Administered 2016-10-02 (×2): via INTRAVENOUS
  Administered 2016-10-02: 1000 mL via INTRAVENOUS

## 2016-10-02 MED ORDER — SUGAMMADEX SODIUM 200 MG/2ML IV SOLN
INTRAVENOUS | Status: AC
  Filled 2016-10-02: qty 2

## 2016-10-02 MED ORDER — NALOXONE HCL 0.4 MG/ML IJ SOLN: 0.4000 mg | INTRAMUSCULAR | Status: DC | PRN

## 2016-10-02 MED ORDER — ROCURONIUM BROMIDE 100 MG/10ML IV SOLN
INTRAVENOUS | Status: DC | PRN
  Administered 2016-10-02: 50 mg via INTRAVENOUS
  Administered 2016-10-02: 10 mg via INTRAVENOUS
  Administered 2016-10-02: 5 mg via INTRAVENOUS
  Administered 2016-10-02: 15 mg via INTRAVENOUS
  Administered 2016-10-02: 10 mg via INTRAVENOUS
  Administered 2016-10-02 (×2): 15 mg via INTRAVENOUS

## 2016-10-02 MED ORDER — PROMETHAZINE HCL 25 MG/ML IJ SOLN: 6.2500 mg | INTRAMUSCULAR | Status: DC | PRN

## 2016-10-02 MED ORDER — VITAMIN B-12 1000 MCG PO TABS
5000.0000 ug | ORAL_TABLET | Freq: Every day | ORAL | Status: DC
  Filled 2016-10-02 (×3): qty 5

## 2016-10-02 MED ORDER — HYDROMORPHONE HCL 1 MG/ML IJ SOLN
INTRAMUSCULAR | Status: AC
  Administered 2016-10-02: 0.25 mg via INTRAVENOUS
  Filled 2016-10-02: qty 1

## 2016-10-02 MED ORDER — ONDANSETRON HCL 4 MG PO TABS: 4.0000 mg | ORAL_TABLET | Freq: Four times a day (QID) | ORAL | Status: DC | PRN

## 2016-10-02 MED ORDER — LACTATED RINGERS IV SOLN
INTRAVENOUS | Status: DC
  Administered 2016-10-02 – 2016-10-03 (×3): via INTRAVENOUS

## 2016-10-02 SURGICAL SUPPLY — 88 items
ADH SKN CLS APL DERMABOND .7 (GAUZE/BANDAGES/DRESSINGS) ×8
APL SRG 38 LTWT LNG FL B (MISCELLANEOUS)
APPLICATOR ARISTA FLEXITIP XL (MISCELLANEOUS) IMPLANT
BLADE SURG 11 STRL SS (BLADE) ×5 IMPLANT
BLADE SURG 15 STRL LF C SS BP (BLADE) ×4 IMPLANT
BLADE SURG 15 STRL SS (BLADE) ×5
CANISTER SUCT 3000ML (MISCELLANEOUS) ×5 IMPLANT
CATH FOLEY 2WAY SLVR  5CC 18FR (CATHETERS) ×1
CATH FOLEY 2WAY SLVR 5CC 18FR (CATHETERS) ×4 IMPLANT
CATH FOLEY 3WAY  5CC 16FR (CATHETERS)
CATH FOLEY 3WAY 5CC 16FR (CATHETERS) IMPLANT
CELLS DAT CNTRL 66122 CELL SVR (MISCELLANEOUS) IMPLANT
CLOTH BEACON ORANGE TIMEOUT ST (SAFETY) ×5 IMPLANT
CONT PATH 16OZ SNAP LID 3702 (MISCELLANEOUS) IMPLANT
COVER LIGHT HANDLE  1/PK (MISCELLANEOUS) ×1
COVER LIGHT HANDLE 1/PK (MISCELLANEOUS) IMPLANT
DECANTER SPIKE VIAL GLASS SM (MISCELLANEOUS) IMPLANT
DERMABOND ADVANCED (GAUZE/BANDAGES/DRESSINGS) ×2
DERMABOND ADVANCED .7 DNX12 (GAUZE/BANDAGES/DRESSINGS) ×4 IMPLANT
DEVICE CAPIO SLIM SINGLE (INSTRUMENTS) IMPLANT
DISSECTOR SPONGE CHERRY (GAUZE/BANDAGES/DRESSINGS) ×5 IMPLANT
DRAPE UNDERBUTTOCKS STRL (DRAPE) ×5 IMPLANT
DRAPE WARM FLUID 44X44 (DRAPE) ×5 IMPLANT
DRSG OPSITE POSTOP 4X10 (GAUZE/BANDAGES/DRESSINGS) ×5 IMPLANT
DURAPREP 26ML APPLICATOR (WOUND CARE) ×5 IMPLANT
ELECT BLADE 6.5 EXT (BLADE) ×5 IMPLANT
FILTER STRAW FLUID ASPIR (MISCELLANEOUS) IMPLANT
GAUZE PACKING 2X5 YD STRL (GAUZE/BANDAGES/DRESSINGS) ×5 IMPLANT
GAUZE SPONGE 4X4 16PLY XRAY LF (GAUZE/BANDAGES/DRESSINGS) ×5 IMPLANT
GLOVE BIO SURGEON STRL SZ 6.5 (GLOVE) ×10 IMPLANT
GLOVE BIOGEL PI IND STRL 6.5 (GLOVE) ×4 IMPLANT
GLOVE BIOGEL PI IND STRL 7.0 (GLOVE) ×16 IMPLANT
GLOVE BIOGEL PI INDICATOR 6.5 (GLOVE) ×1
GLOVE BIOGEL PI INDICATOR 7.0 (GLOVE) ×5
GOWN STRL REUS W/TWL LRG LVL3 (GOWN DISPOSABLE) ×20 IMPLANT
HEMOSTAT ARISTA ABSORB 3G PWDR (MISCELLANEOUS) IMPLANT
LEGGING LITHOTOMY PAIR STRL (DRAPES) ×5 IMPLANT
MESH RESTORELLE Y CONTOUR (Mesh General) ×1 IMPLANT
NDL MAYO 6 CRC TAPER PT (NEEDLE) IMPLANT
NEEDLE HYPO 22GX1.5 SAFETY (NEEDLE) ×5 IMPLANT
NEEDLE MAYO 6 CRC TAPER PT (NEEDLE) IMPLANT
NS IRRIG 1000ML POUR BTL (IV SOLUTION) ×6 IMPLANT
PACK ABDOMINAL GYN (CUSTOM PROCEDURE TRAY) ×5 IMPLANT
PACK TRENDGUARD 450 HYBRID PRO (MISCELLANEOUS) IMPLANT
PACK TRENDGUARD 600 HYBRD PROC (MISCELLANEOUS) IMPLANT
PACK VAGINAL MINOR WOMEN LF (CUSTOM PROCEDURE TRAY) ×5 IMPLANT
PACK VAGINAL WOMENS (CUSTOM PROCEDURE TRAY) ×4 IMPLANT
PAD MAGNETIC INST (MISCELLANEOUS) ×4 IMPLANT
PAD OB MATERNITY 4.3X12.25 (PERSONAL CARE ITEMS) ×5 IMPLANT
PLUG CATH AND CAP STER (CATHETERS) ×5 IMPLANT
PROTECTOR NERVE ULNAR (MISCELLANEOUS) ×4 IMPLANT
RETRACTOR WND ALEXIS 18 MED (MISCELLANEOUS) IMPLANT
RETRACTOR WND ALEXIS 25 LRG (MISCELLANEOUS) IMPLANT
RTRCTR C-SECT PINK 25CM LRG (MISCELLANEOUS) ×5 IMPLANT
RTRCTR WOUND ALEXIS 18CM MED (MISCELLANEOUS)
RTRCTR WOUND ALEXIS 25CM LRG (MISCELLANEOUS) ×5
SET CYSTO W/LG BORE CLAMP LF (SET/KITS/TRAYS/PACK) ×5 IMPLANT
SHEET LAVH (DRAPES) ×5 IMPLANT
SLING TVT EXACT (Sling) ×1 IMPLANT
SPONGE LAP 18X18 X RAY DECT (DISPOSABLE) ×8 IMPLANT
SPONGE SURGIFOAM ABS GEL 12-7 (HEMOSTASIS) IMPLANT
STAPLER VISISTAT 35W (STAPLE) ×4 IMPLANT
SURGIFLO TRUKIT (HEMOSTASIS) IMPLANT
SURGIFLO W/THROMBIN 8M KIT (HEMOSTASIS) IMPLANT
SUT CAPIO ETHIBPND (SUTURE) IMPLANT
SUT ETHIBOND 0 (SUTURE) ×8 IMPLANT
SUT PLAIN 2 0 XLH (SUTURE) ×5 IMPLANT
SUT VIC AB 0 CT1 18XCR BRD8 (SUTURE) ×8 IMPLANT
SUT VIC AB 0 CT1 27 (SUTURE) ×15
SUT VIC AB 0 CT1 27XBRD ANBCTR (SUTURE) ×12 IMPLANT
SUT VIC AB 0 CT1 8-18 (SUTURE) ×10
SUT VIC AB 2-0 CT1 (SUTURE) ×5 IMPLANT
SUT VIC AB 2-0 CT1 27 (SUTURE) ×5
SUT VIC AB 2-0 CT1 TAPERPNT 27 (SUTURE) ×4 IMPLANT
SUT VIC AB 2-0 CT2 27 (SUTURE) ×1 IMPLANT
SUT VIC AB 2-0 SH 27 (SUTURE) ×10
SUT VIC AB 2-0 SH 27XBRD (SUTURE) ×16 IMPLANT
SUT VIC AB 2-0 UR6 27 (SUTURE) IMPLANT
SUT VIC AB 4-0 PS2 27 (SUTURE) ×5 IMPLANT
SUT VICRYL 0 TIES 12 18 (SUTURE) ×5 IMPLANT
SYR CONTROL 10ML LL (SYRINGE) ×1 IMPLANT
TOWEL OR 17X24 6PK STRL BLUE (TOWEL DISPOSABLE) ×10 IMPLANT
TRAY FOLEY CATH SILVER 14FR (SET/KITS/TRAYS/PACK) ×5 IMPLANT
TRAY FOLEY CATH SILVER 16FR (SET/KITS/TRAYS/PACK) ×4 IMPLANT
TRENDGUARD 450 HYBRID PRO PACK (MISCELLANEOUS) ×5
TRENDGUARD 600 HYBRID PROC PK (MISCELLANEOUS)
TUBING NON-CON 1/4 X 20 CONN (TUBING) ×4 IMPLANT
WATER STERILE IRR 1000ML POUR (IV SOLUTION) ×5 IMPLANT

## 2016-10-02 NOTE — Anesthesia Postprocedure Evaluation (Signed)
Anesthesia Post Note  Patient: Christina Buchanan  Procedure(s) Performed: Procedure(s) (LRB): HYSTERECTOMY ABDOMINAL (N/A) SALPINGO OOPHORECTOMY (Bilateral) ABDOMINO SACROCOLPOPEXY (N/A) ANTERIOR (CYSTOCELE) AND POSTERIOR REPAIR (RECTOCELE) (N/A) TRANSVAGINAL TAPE (TVT) PROCEDURE exact midurethral sling (N/A) CYSTOSCOPY (N/A)  Patient location during evaluation: PACU Anesthesia Type: General Level of consciousness: sedated Pain management: pain level controlled Vital Signs Assessment: post-procedure vital signs reviewed and stable Respiratory status: spontaneous breathing and respiratory function stable Cardiovascular status: stable Anesthetic complications: no        Last Vitals:  Vitals:   10/02/16 1449 10/02/16 1455  BP: (!) 141/75   Pulse: 77   Resp:  20  Temp: 36.9 C     Last Pain:  Vitals:   10/02/16 1515  TempSrc:   PainSc: 3    Pain Goal: Patients Stated Pain Goal: 3 (10/02/16 1515)               Benigno Check DANIEL

## 2016-10-02 NOTE — Anesthesia Procedure Notes (Addendum)
Procedure Name: Intubation Date/Time: 10/02/2016 7:42 AM Performed by: Flossie Dibble Pre-anesthesia Checklist: Suction available, Emergency Drugs available, Timeout performed, Patient being monitored and Patient identified Patient Re-evaluated:Patient Re-evaluated prior to inductionOxygen Delivery Method: Circle system utilized Preoxygenation: Pre-oxygenation with 100% oxygen Intubation Type: IV induction Ventilation: Mask ventilation without difficulty and Oral airway inserted - appropriate to patient size Laryngoscope Size: Mac and 3 Grade View: Grade II Tube type: Oral Tube size: 7.0 mm Number of attempts: 1 Airway Equipment and Method: Stylet (Cricoid pressure to enhance view.) Placement Confirmation: ETT inserted through vocal cords under direct vision,  positive ETCO2 and breath sounds checked- equal and bilateral Secured at: 21 cm Dental Injury: Teeth and Oropharynx as per pre-operative assessment

## 2016-10-02 NOTE — Progress Notes (Signed)
MD rounded.  Patient to stay on clears for tonight and start solids tomorrow.  Foley to be removed post op day two. MD to see patient in morning 12/29 to remove vaginal packing.  Patient needs to ambulate tonight, please... Patient to possibly go home Saturday around noon, if all goes well.

## 2016-10-02 NOTE — Progress Notes (Signed)
Update to History and Physical  No marked change in status since office preop visit.   Patient examined.   Ok to proceed.  

## 2016-10-02 NOTE — Op Note (Signed)
OPERATIVE REPORT  PREOPERATIVE DIAGNOSIS: Postmenopausal bleeding, Incomplete uterovaginal prolapse, Genuine Stress Incontinence.  POSTOPERATIVE DIAGNOSIS:  Postmenopausal bleeding, Incomplete uterovaginal prolapse, Genuine Stress Incontinence.   PROCEDURE: Total abdominal hysterectomy with bilateral salpingo-oophorectomy, abdominal sacral colpopexy, anterior and posterior colporrhaphy, TVT Exact midurethral sling and cystoscopy   SURGEON: Lenard Galloway, MD  ASSISTANT: Lyman Speller, MD   ANESTHESIA: General endotracheal, local with 1% lidocaine with epi 1:100,000.  IV FLUIDS: 2000 cc LR  EBL: 100 cc.  URINE OUTPUT: 250 cc.  COMPLICATIONS: None.  INDICATIONS FOR THE PROCEDURE: The patient is a 60 year old gravida 64, para 3, Caucasian female, who presents with recurrent postmenopausal bleeding, uterovaginal prolapse and urinary incontinence with exercise.  She requests surgical care. The patient has had an ultrasound documenting a thin endometrium and she has a small fibroid that may be encroaching on the endometrium.  She had an attempted EMB in the past which did not obtain endometrial tissue due to her prior endometrial ablation and uterine scarring.  She did undergo multichannel urodynamic testing in the office, and she did not demonstrate any stress incontinence at that time. The plan is now made to proceed with a total abdominal hysterectomy with a bilateral salpingo-oophorectomy, abdominal sacral colpopexy,possible  Halban's culdoplasty, TVT Exact mid urethral sling, and anterior and posterior colporrhaphy. Risks, benefits, and alternatives have been reviewed with the patient who wishes to proceed.  She understands that there is a small risk of undiagnosed uterine cancer which may require further surgery and treatment with an oncologist, and she accepts this.   FINDINGS: Examination under anesthesia revealed a third-degree cystocele and second-degree  uterine prolapse and a first-degree rectocele.  At the time of laparotomy, the patient was noted to have a normal uterus, bilateral tubes and ovaries. There was evidence of adhesive disease of the large intestine to the bilateral pelvic sidewalls. Omentum was also adherent to the right abdominal wall.  Cystoscopy at termination of the bladder portion of the procedure documented the bladder to be intact throughout 360 degrees including the bladder dome and trigone. There was no evidence of a foreign body in the bladder or the urethra. The ureters were patent bilaterally.  SPECIMENS: The uterus, cervix, bilateral tubes and bilateral ovaries were sent to Pathology.  PROCEDURE IN DETAIL: The patient was reidentified in the preop hold area. She did receive cefotetan 2 g IV for antibiotic prophylaxis. She received both TED hose, PAS stockings, and Lovenox for DVT prophylaxis.  The patient was escorted to the operating room, where she was placed in the dorsal lithotomy position in [place]. General endotracheal anesthesia was induced. The patient's lower abdomen, vagina, and perineum were sterilely prepped and she was sterilely draped. The Foley catheter was placed inside the bladder and left to gravity drainage throughout and at the termination of the procedure.  The procedure began by creating a Pfannenstiel incision with a scalpel. The incision was carried down to the subcutaneous tissues using monopolar cautery for hemostasis. The fascia was incised in the midline with a scalpel and the incision was extended bilaterally using the Mayo scissors. The rectus muscles were bluntly divided in the midline. The parietal peritoneum was elevated with 2 hemostat clamps and entered sharply with the Metzenbaum scissors. The peritoneal incision was extended cranially and caudally.  Bowel and omental adhesions were lysed with a Metzembaum scissors.  An Alexis retractor was  then placed inside the peritoneal cavity and the patient was placed in the Trendelenburg position. The bowel was packed into the  upper abdomen with moistened lap pads.  The procedure began by placing Kelly clamps across the adnexal structures bilaterally. The left round ligament was isolated and was suture ligated with a transfixing suture of 0 Vicryl. Monopolar cautery was used to bisect the round ligament and the anterior and posterior leaves were opened with monopolar cautery. The bladder flap was taken down anteriorly using a Metzenbaum scissors. The ureter was identified along the patient's left pelvic sidewall. The window was created through the posterior leaf of the broad ligament on this side and the infundibulopelvic ligament was doubly clamped, sharply divided, and free tied with 0 Vicryl followed by suture ligature of the same. The same procedure that was performed on the patient's left hand side was now repeated on the patient's right hand side for isolation of the round ligament, dissection in the broad ligament, and the isolation of the infundibulopelvic ligament. Again, the ureter was identified prior to clamping the infundibulopelvic ligament.  The bladder flap was further taken down sharply with the Metzenbaum scissors. Each of the uterine arteries were skeletonized and they were then clamped, sharply divided, and suture ligated with 0 Vicryl suture. The bladder was further dissected off the cervix anteriorly and the inferior aspects of the cardinal ligaments were clamped with straight Heaney clamps, sharply divided, and suture ligated with 0 Vicryl bilaterally. Eventually, the uterosacral ligaments were reached. A clamp was placed across the right vaginal apex and the vagina was entered in this location. The pedicle was suture ligated with a transfixing suture of 0 Vicryl. The same was performed on the patient's left hand side. The Mayo scissors was  used to sharply excise the cervix and the specimen from the vaginal apex. The specimen was sent to Pathology.  The vaginal cuff was closed at this time with figure-of-eight sutures of 0 Vicryl.  All operative sites demonstrated good hemostasis.  The abdominal sacrocolpopexy was performed next. An EEA Sizer was placed in the vagina at this time and the peritoneum along the anterior and posterior vaginal walls was opened and dissection performed down to the level of the underside of the vagina, both anteriorly and posteriorly.  Attention at this time was turned to the sacral promontory. The peritoneum overlying the sacral promontory was identified along with the landmarks of the bifurcation of the aorta and iliac vessels and the right ureter. The peritoneum was opened using a combination of a Metzenbaum scissors and monopolar cautery. Careful dissection was performed down to the level of the sacral promontory. The anterior longitudinal ligament was identified along with the middle sacral vessels. Hemostasis was good. The peritoneum was then opened inferiorly along the patient's right pelvic sidewall and all the way down to the level of dissection along the posterior vaginal peritoneal incision.  At this time, the Colpoplast mesh was trimmed to properly fit the patient's anatomy and the mesh was secured to the anterior vagina using a series of several 0 Ethibond sutures. The same was performed along the posterior vaginal wall.  The EEA Sizer was removed and a hand was placed inside the vagina and the elevation of the vaginal cuff was simulated. The point of attachment of the Colpoplast mesh was then mapped to the sacral promontory and 2 sutures of 0 Ethibond were brought through the anterior longitudinal ligament. These sutures were then brought through the mesh.  The sutures were tied. Excess mesh was trimmed at this time. The mesh was re- peritonealized by  closing the retroperitoneal space using 2-0 Vicryl suture.  2-0 Vicryl suture was also used to close the peritoneum over the vaginal cuff extending from right round ligament to the left round ligament. This allowed 100% coverage of the mesh material.  The pelvis was once again irrigated and suctioned. Hemostasis was good. The abdomen was closed at this time. The moistened lap pads and the Alexis retractor were removed from the peritoneal cavity. The parietal peritoneum was closed with a running suture of 2-0 Vicryl.   The fascia was closed with a running suture of 0 Vicryl. The subcutaneous  layer was irrigated and suctioned and made hemostatic with monopolar  cautery. Interrupted sutures of 2-0 plain were placed in the subcutaneous  layer and the skin was closed with a subcuticular suture of 4-0 Vicryl.  Dermabond was placed over the incision. A sterile bandage was later placed.  Attention was turned to the vaginal portion of the procedure at this point. The patient was examined and there was excellent vaginal cuff support. There was first degree a cystocele and first degree rectocele noted. There were two suture of the Ethibond which had penetrated the right vaginal mucosa, and these slight were bleeding slightly.  They were observed during the  surgery and found to have adequate hemostasis at the end of the procedure.   Allis clamps were used to mark the anterior vaginal mucosa beginning 1 cm below the urethral meatus and extending down toward the vaginal cuff. The mucosa was injected locally with 1% lidocaine with epinephrine 1:100,000. The mucosa was incised vertically in the midline with a scalpel and a combination of sharp and blunt dissection were used to dissect this vaginal tissue and mucosa off the underlying bladder. The dissection was carried back to the pubic rami.  A 1 cm suprapubic incisions were then created 2 cm to the right and left in the midline.  The Foley catheter was removed. The TVT Exact was performed in a bottom up fashion. The urethral guide and Foley tip were placed in the urethra and the urethra was deflected properly as the TVT guide was brought through the right retropubic space and up through the right suprapubic incision. The urethra was then deflected in the opposite direction and the TVT guide was placed through the left retropubic space and through the left suprapubic ipsilateral incision.  The obturator guide was removed at this time and cystoscopy was performed and the findings were as noted above.  The bladder was drained of all cystoscopic fluid and the Foley catheter was replaced. The sling was brought up through the suprapubic incisions bilaterally. The plastic sheaths were removed as a Kelly clamp was placed between the urethra and the sling. The sling was noted to be in excellent position. Excess mesh was trimmed suprapubically. 0/0 vicryl  vertical mattress sutures were placed for good reduction of the cystocele.   The vaginal skin edges were trimmed at this time and the anterior vaginal wall was closed with a running lock suture of 2-0 Vicryl.  The suprapubic incisions were closed at the termination of the procedure with Dermabond.  Attention was turned to the posterior vaginal wall at this time. Allis clamps were used to mark the introitus, the perineal body, and then the posterior vaginal wall mucosa. The perineal body and the posterior vaginal mucosa were injected with 1% lidocaine with epinephrine 1:100,000. A triangular wedge of epithelium was excised from the perineal body and the posterior vaginal mucosa was incised vertically in the midline with the Metzenbaum scissors. Sharp and blunt dissection were  used to dissect the perirectal fascia off the vaginal mucosa bilaterally. The posterior colporrhaphy was then performed with vertical mattress sutures of 0 Vicryl which reduced  the rectocele. Crown stitches of 0 Vicryl were placed along the perineal body. Rectal exam confirmed the absence of sutures in the rectum. Excess vaginal mucosa was trimmed and the posterior vaginal mucosa was closed with a running lock suture of 2-0 Vicryl. This was brought down to the hymenal ring and then under the hymen. The transverse perineal muscles were reapproximated with a running suture of this 2-0 Vicryl. The suture was then brought up the perineal body in a subcuticular fashion as for an episiotomy repair. The knot was tied at the hymen.  Vaginal examination confirmed excellent vaginal support and elevation. Final rectal exam documented the absence of sutures in the rectum. A gauze packing with Estrace cream was placed inside the vagina.  At this point, the patient was awakened and extubated, and escorted to the recovery room in stable condition. There were no complications. All needle, instrument, and sponge counts were correct.     Lenard Galloway, M.D.

## 2016-10-02 NOTE — Brief Op Note (Signed)
10/02/2016  12:37 PM  PATIENT:  Christina Buchanan  60 y.o. female  PRE-OPERATIVE DIAGNOSIS:  POST MENOPAUSAL BLEEDING,FIBROIDS,UTERUS VAGINAL PROLAPSE,STRESS URINARY INCONTINENCE  POST-OPERATIVE DIAGNOSIS:  POST MENOPAUSAL BLEEDING,FIBROIDS,UTERUS VAGINAL PROLAPSE, STRESS URINARY INCONTINENCE  PROCEDURE:  Procedure(s) with comments: HYSTERECTOMY ABDOMINAL (N/A) - 4 hours SALPINGO OOPHORECTOMY (Bilateral) ABDOMINO SACROCOLPOPEXY (N/A) ANTERIOR (CYSTOCELE) AND POSTERIOR REPAIR (RECTOCELE) (N/A) TRANSVAGINAL TAPE (TVT) PROCEDURE exact midurethral sling (N/A) CYSTOSCOPY (N/A)  SURGEON:  Surgeon(s) and Role:    * Tonjua Rossetti E Yisroel Ramming, MD - Primary    * Megan Salon, MD - Assisting  PHYSICIAN ASSISTANT: NA  ASSISTANTS: Lyman Speller, MD   ANESTHESIA:   local and general  EBL:  Total I/O In: 2000 [I.V.:2000] Out: 350 [Urine:250; Blood:100]  BLOOD ADMINISTERED:none  DRAINS: Urinary Catheter (Foley)   LOCAL MEDICATIONS USED:  LIDOCAINE   SPECIMEN:  Source of Specimen:  Uterus, cervix, bilateral tubes and ovaries  DISPOSITION OF SPECIMEN:  PATHOLOGY  COUNTS:  YES  TOURNIQUET:  * No tourniquets in log *  DICTATION: .Note written in EPIC  PLAN OF CARE: Admit to inpatient   PATIENT DISPOSITION:  PACU - hemodynamically stable.   Delay start of Pharmacological VTE agent (>24hrs) due to surgical blood loss or risk of bleeding: no

## 2016-10-02 NOTE — Transfer of Care (Signed)
Immediate Anesthesia Transfer of Care Note  Patient: Christina Buchanan  Procedure(s) Performed: Procedure(s) with comments: HYSTERECTOMY ABDOMINAL (N/A) - 4 hours SALPINGO OOPHORECTOMY (Bilateral) ABDOMINO SACROCOLPOPEXY (N/A) ANTERIOR (CYSTOCELE) AND POSTERIOR REPAIR (RECTOCELE) (N/A) TRANSVAGINAL TAPE (TVT) PROCEDURE exact midurethral sling (N/A) CYSTOSCOPY (N/A)  Patient Location: PACU  Anesthesia Type:General  Level of Consciousness: awake, alert  and oriented  Airway & Oxygen Therapy: Patient Spontanous Breathing and Patient connected to nasal cannula oxygen  Post-op Assessment: Report given to RN and Post -op Vital signs reviewed and stable  Post vital signs: Reviewed and stable  Last Vitals:  Vitals:   10/02/16 0634 10/02/16 1238  BP: (!) 147/83   Pulse: 72   Resp: 16 (P) 14  Temp: 37 C (P) 37.2 C    Last Pain:  Vitals:   10/02/16 0634  TempSrc: Oral      Patients Stated Pain Goal: 3 ([id number])  Complications: No apparent anesthesia complications

## 2016-10-02 NOTE — Progress Notes (Signed)
Day of Surgery Procedure(s) (LRB): HYSTERECTOMY ABDOMINAL (N/A) SALPINGO OOPHORECTOMY (Bilateral) ABDOMINO SACROCOLPOPEXY (N/A) ANTERIOR (CYSTOCELE) AND POSTERIOR REPAIR (RECTOCELE) (N/A) TRANSVAGINAL TAPE (TVT) PROCEDURE exact midurethral sling (N/A) CYSTOSCOPY (N/A)  Subjective: Patient reports feels sleepy.  Good pain control.   Objective: I have reviewed patient's vital signs and intake and output. Vitals:   10/02/16 1602 10/02/16 1759  BP: 138/70   Pulse: 72   Resp: 12 12  Temp: 98.9 F (37.2 C)    I/O - 3592 cc/700 cc  General: alert and cooperative Resp: clear to auscultation bilaterally Cardio: regular rate and rhythm, S1, S2 normal, no murmur, click, rub or gallop GI: incision: clean, dry and intact and Abdomen soft with minimal bowel sounds, nontender. Extremities: PAS and Ted hose on.  DPs 2+ bilaterally.  Vaginal Bleeding: none  Assessment: s/p Procedure(s) with comments: HYSTERECTOMY ABDOMINAL (N/A) - 4 hours SALPINGO OOPHORECTOMY (Bilateral) ABDOMINO SACROCOLPOPEXY (N/A) ANTERIOR (CYSTOCELE) AND POSTERIOR REPAIR (RECTOCELE) (N/A) TRANSVAGINAL TAPE (TVT) PROCEDURE exact midurethral sling (N/A) CYSTOSCOPY (N/A): stable  Plan: Foley to gravity drainage.   Clear liquids.  Morphine PCA and Toradol.  Lovenox daily for prophylaxis.  Reviewed surgical findings and procedure.    LOS: 0 days    Arloa Koh 10/02/2016, 6:06 PM

## 2016-10-02 NOTE — OR Nursing (Signed)
Surgical update called to family. 

## 2016-10-02 NOTE — H&P (Signed)
Office Visit   09/22/2016 [place], MD  Obstetrics and Gynecology   Uterovaginal prolapse, incomplete +1 more  Dx   Office Visit ; Referred by Binnie Rail, MD  Reason for Visit   Additional Documentation   Vitals:   BP 118/76 (BP Location: Right Arm, Patient Position: Sitting, Cuff Size: Normal)   Pulse 76   Resp 16   Ht 5\' 3"  (1.6 m)   Wt 156 lb (70.8 kg)   LMP 03/06/2006 (Approximate)   BMI 27.63 kg/m   BSA 1.77 m   Flowsheets:   Custom Formula Data,   MEWS Score,   Anthropometrics,   Infectious Disease Screening     Encounter Info:   Billing Info,   History,   Allergies,   Detailed Report     All Notes   Progress Notes by Nunzio Cobbs, MD at 09/22/2016 12:00 PM   Author: Nunzio Cobbs, MD Author Type: Physician Filed: 09/24/2016 8:22 PM  Note Status: Signed Cosign: Cosign Not Required Encounter Date: 09/22/2016 12:00 PM  Editor: Nunzio Cobbs, MD (Physician)  Prior Versions: 1. Matthew Folks, RN (Registered Nurse) at 09/22/2016 11:56 AM - Sign at close encounter   2. Archie Balboa, CMA (Certified Medical Assistant) at 09/22/2016 11:49 AM - Sign at close encounter    GYNECOLOGY  VISIT   HPI: 60 y.o.   Married  Caucasian  female   [phone] with Patient's last menstrual period was 03/06/2006 (approximate).   here for  Surgery consult - HYSTERECTOMY ABDOMINAL, SALPINGO OOPHORECTOMY, ABDOMINO SACROCOLPOPEXY with Halban's culdoplasty, ANTERIOR (CYSTOCELE) AND POSTERIOR REPAIR (RECTOCELE), TRANSVAGINAL TAPE (TVT) PROCEDURE exact midurethral sling, General CYSTOSCOPY                         Patient is scheduled for surgery for recurrent postmenopausal bleeding, prolapse, and incontinence.  Leaks urine with sneeze. Leaks for sure with exercise and jumping.  Wears a pad. No leak for no reason at all. Urge to empty first thing in the am. DF - every 3 - 4 hours. NF -  none. Takes a while to complete her voiding. Goes on and on.  No painful urination or blood in the urine.  Does not know when she last had a UTI - "a long time." No hx renal stones.   Results of Urodynamics done on 08/20/16: Uroflow - continuous, void of 27 cc, PVR 25 cc. CMG - Stable. S1: 255 cc, S2: 421 cc, S3: 589 cc. VLPP 86 cm H20 at 610 cc.  UPP - 11 and 26 cmH2O. Pressure flow - Pdet max 67 cm H2O. Voided volume - 562 cc.  Evidence of GSI.   No constipation.  Has always had IBS. This is better now.  No splinting. When she has an IBS flare, has some fecal soiling.  This happened all the time and then changed a couple of months ago when she starting avoiding gluten.  Largest vaginal delivery was 6 pounds, 13 ounces.  One forceps delivery - 6 pounds.  No hx of extensive laceration through rectum.   Sexually active.  No pain or discomfort.  She does feel a bulge.  Hx recurrent postmenopausal bleeding.  Hx endometrial ablation, HTA in 2004. No prior testing to document menopause by hormonal testing.   Hadvaginal spotting - February 2017 and June 2017.  Not taking any hormonal therapy.  Pelvic ultrasound 05/10/15 for postmenopausal bleeding. Uterus - 2.7 cm fundal intramural fibroid. Cannot rule out encroachment on endometrial canal. EMS - 1.90 mm. Ovaries - normal Free fluid - no Sonohysterogram showed no filling defects and inadequate fitting of the endometrial cavity. Possible encroachment of fibroid into the endometrium.  EMB at that time: Rare endocervical glands only. No endometrial tissue.   Ultrasound done 05/22/16: EMS 2.59 mm. 2 fibroids 0.76 and 2.62 cm, latter possibly encroaching on endometrium.  Normal ovaries.  No free fluid. No EMB done.  FSH 62.9 and estradiol 19 on 05/22/16.  Hx of right colectomy for precancerous lesion of the colon.   GYNECOLOGIC HISTORY: Patient's last menstrual period was 03/06/2006  (approximate). Contraception:  Vasectomy/Postmenopausal Menopausal hormone therapy:  none Last mammogram:  02-27-16 Density C/Neg/BiRads1:TBC5-24-17 Density C/Neg/BiRads1:TBC Last pap smear:   04-23-15 neg HPV HR neg                 OB History    Gravida Para Term Preterm AB Living   3 3 3  0 0 3   SAB TAB Ectopic Multiple Live Births   0 0 0 0 3             Patient Active Problem List   Diagnosis Date Noted  . Diabetes ([place]) 08/12/2016  . Pure hypercholesterolemia 04/29/2016  . PMB (postmenopausal bleeding) 04/29/2016  . 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 Hematuria 04/29/2016  . Estrogen deficiency 04/29/2016  . Fibroid, uterine 04/29/2016  . Elevated LFTs 12/12/2011  . Vitamin D deficiency 01/29/2010  . Hyperlipidemia 01/21/2010  . SMOKER 01/21/2010  . Depression 01/21/2010  . ALLERGIC RHINITIS 01/21/2010  . Personal history of colonic polyps 07/30/2006        Past Medical History:  Diagnosis Date  . ALLERGIC RHINITIS   . Arthritis    hands  . DEPRESSION   . Diabetes mellitus without complication ([place]) 123456   diet controlled  . HYPERLIPIDEMIA   . Personal history of colonic polyps   . SMOKER   . VITAMIN D DEFICIENCY          Past Surgical History:  Procedure Laterality Date  . BREAST SURGERY  1990   breast biopsy  . COLONOSCOPY W/ BIOPSIES  9/07   2 polyps & tubuvillous adenoma  . COLONOSCOPY W/ BIOPSIES  11/13/08   sigmoid ddiverticuli and rechek in 5 years - Dr. Collene Mares  . ENDOMETRIAL ABLATION  08/21/03   HTA  . 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 UPPER GASTROINTESTINAL ENDOSCOPY  06/15/06   2 polyps - H pylori          Current Outpatient Prescriptions  Medication Sig Dispense Refill  . aspirin 81 MG tablet Take 81 mg by mouth daily.      [name] azelastine (ASTELIN) 0.1 % nasal spray USE ONE SPRAY IN EACH NOSTRIL DAILY AS NEEDED (Patient taking differently: Place 1  spray into both nostrils daily. ) 90 mL 0  . cetirizine (ZYRTEC) 10 MG tablet Take 10 mg by mouth daily as needed for allergies.     [name] escitalopram (LEXAPRO) 10 MG tablet Take 1 tablet (10 mg total) by mouth daily. 90 tablet 4  . OVER THE COUNTER MEDICATION Take 2 capsules by mouth daily. Herbal supplement for sugar regulation    . simvastatin (ZOCOR) 20 MG tablet Take 1 tablet by mouth daily at 6 PM ---- Needs appt for further refills 90 tablet 0  .  vitamin B-12 (CYANOCOBALAMIN) 1000 MCG tablet Take 5,000 mcg by mouth daily.    . Vitamin D, Ergocalciferol, (DRISDOL) 50000 units CAPS capsule Take 50,000 Units by mouth every 7 (seven) days. mondays     No current facility-administered medications for this visit.      ALLERGIES: Patient has no known allergies.        Family History  Problem Relation Age of Onset  . Breast cancer Mother 34  . Diabetes Mother   . Stroke Mother            . Hyperlipidemia Mother   . Heart failure Mother   . Hypertension Mother   . COPD Mother   . Hypertension Father   . Heart disease Father   . Hyperlipidemia Father   . Heart failure Father     dec at age 76  . Breast cancer Sister 82  . Heart disease Sister     Afib  . Psoriasis Son     Social History        Social History  . Marital status: Married    Spouse name: N/A  . Number of children: N/A  . Years of education: N/A      Occupational History  . Not on file.         Social History Main Topics  . Smoking status: Light Tobacco Smoker    Packs/day: 0.30    Years: 30.00    Types: Cigarettes    Last attempt to quit: 02/04/2015  . Smokeless tobacco: Never Used     Comment: Married, lives with spouse. Cares for elderly parent and in-laws  . Alcohol use 0.0 oz/week     Comment: Rare glass of wine  . Drug use: No  . Sexual activity: Yes    Partners: Male    Birth control/ protection: Other-see comments     Comment: husband  had vasectomy       Other Topics Concern  . Not on file      Social History Narrative   Lives with husband in a one story home.  She has 3 grown children.     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     Education: high school.        No regular exercise    ROS:  Pertinent items are noted in HPI.  PHYSICAL EXAMINATION:    BP 118/76 (BP Location: Right Arm, Patient Position: Sitting, Cuff Size: Normal)   Pulse 76   Resp 16   Ht 5\' 3"  (1.6 m)   Wt 156 lb (70.8 kg)   LMP 03/06/2006 (Approximate)   BMI 27.63 kg/m     General appearance: alert, cooperative and appears stated age Head: Normocephalic, without obvious abnormality, atraumatic Neck: no adenopathy, supple, symmetrical, trachea midline and thyroid normal to inspection and palpation Lungs: clear to auscultation bilaterally Breasts: normal appearance, no masses or tenderness, No nipple retraction or dimpling, No nipple discharge or bleeding, No axillary or supraclavicular adenopathy Heart: regular rate and rhythm Abdomen: right mid abdomen transverse incision, soft, non-tender, no masses,  no organomegaly Extremities: extremities normal, atraumatic, no cyanosis or edema Skin: Skin color, texture, turgor normal. No rashes or lesions Lymph nodes: Cervical, supraclavicular, and axillary nodes normal. No abnormal inguinal nodes palpated Neurologic: Grossly normal  Pelvic: External genitalia:  no lesions              Urethra:  normal appearing urethra with no masses, tenderness or lesions  Bartholins and Skenes: normal                 Vagina: normal appearing vagina with normal color and discharge, no lesions.  Second degree cystocele, first degree uterine prolapse, second degree rectocele.               Cervix: no lesions                Bimanual Exam:  Uterus:  normal size, contour, position, consistency, mobility, non-tender              Adnexa: no mass, fullness, tenderness              Rectal exam: Yes.   .  Confirms.              Anus:  normal sphincter tone, no lesions  Chaperone was present for exam.  ASSESSMENT  Recurrent postmenopausal bleeding.  Hx HTA endometrial ablation. Two fibroids. Fundal fibroid with probable encroachment on endometrial canal.  Atrophic appearing endometrium.  Nondiagnostic endometrial biopsy previously when endometrium was atrophic and measured 1.9 mm.  EMS now 2.59 mm. Incomplete uterovaginal prolapse.  Genuine stress incontinence. Status post right hemicolectomy for high grade dysplasia. DM. HgbA1C 6.6. PCP following and not recommendation medication at this time.  PLAN  Plan for abdominal hysterectomy with bilateral salpingo-oophorectomy, abdominosacrocolpopexy, Halban's culdoplasty, anterior and posterior colporrhaphy, TVT Exact midurethral sling, and cystoscopy.  Risks, benefits, and alternatives discussed with the patient who wishes to proceed.   An After Visit Summary was printed and given to the patient.

## 2016-10-02 NOTE — Progress Notes (Signed)
Patient will not be taking vitamin B12 whilst in the hospital.  Stock returned to pharmacy and discontinued.  Patient stated "I don't need that, I only take them periodically".  No further.  RX requested discontinuation.

## 2016-10-02 NOTE — Addendum Note (Signed)
Addendum  created 10/02/16 1946 by Riki Sheer, CRNA   Sign clinical note

## 2016-10-02 NOTE — Anesthesia Postprocedure Evaluation (Signed)
Anesthesia Post Note  Patient: Christina Buchanan  Procedure(s) Performed: Procedure(s) (LRB): HYSTERECTOMY ABDOMINAL (N/A) SALPINGO OOPHORECTOMY (Bilateral) ABDOMINO SACROCOLPOPEXY (N/A) ANTERIOR (CYSTOCELE) AND POSTERIOR REPAIR (RECTOCELE) (N/A) TRANSVAGINAL TAPE (TVT) PROCEDURE exact midurethral sling (N/A) CYSTOSCOPY (N/A)  Patient location during evaluation: [facility] Anesthesia Type: General Level of consciousness: awake and alert Pain management: pain level controlled Vital Signs Assessment: post-procedure vital signs reviewed and stable Respiratory status: spontaneous breathing, nonlabored ventilation, respiratory function stable and patient connected to nasal cannula oxygen Cardiovascular status: blood pressure returned to baseline and stable Postop Assessment: no signs of nausea or vomiting Anesthetic complications: no        Last Vitals:  Vitals:   10/02/16 1759 10/02/16 1918  BP:  117/70  Pulse:  67  Resp: 12 16  Temp:  36.9 C    Last Pain:  Vitals:   10/02/16 1918  TempSrc: Oral  PainSc:    Pain Goal: Patients Stated Pain Goal: 3 (10/02/16 1515)               Riki Sheer

## 2016-10-02 NOTE — Progress Notes (Signed)
MD here at  [facility] to see patient before breakfast and remove vaginal packing.  Foley to be removed post op day two.  Patient to ambulate this evening. Patient to stay on clear liquid diet until tomorrow 12/29. Patient will possibly discharge Saturday 12/30 at noon, if all goes well.

## 2016-10-03 ENCOUNTER — Encounter: Payer: BLUE CROSS/BLUE SHIELD | Admitting: Internal Medicine

## 2016-10-03 ENCOUNTER — Encounter (HOSPITAL_COMMUNITY): Payer: Self-pay | Admitting: Obstetrics and Gynecology

## 2016-10-03 LAB — BASIC METABOLIC PANEL
Anion gap: 7 (ref 5–15)
BUN: 12 mg/dL (ref 6–20)
CO2: 27 mmol/L (ref 22–32)
Calcium: 8.2 mg/dL — ABNORMAL LOW (ref 8.9–10.3)
Chloride: 103 mmol/L (ref 101–111)
Creatinine, Ser: 0.51 mg/dL (ref 0.44–1.00)
GFR calc Af Amer: >60 mL/min (ref >60)
GFR calc non Af Amer: >60 mL/min (ref >60)
Glucose, Bld: 136 mg/dL — ABNORMAL HIGH (ref 65–99)
Potassium: 4 mmol/L (ref 3.5–5.1)
Sodium: 137 mmol/L (ref 135–145)

## 2016-10-03 LAB — CBC
HCT: 34.6 % — ABNORMAL LOW (ref 36.0–46.0)
Hemoglobin: 11.9 g/dL — ABNORMAL LOW (ref 12.0–15.0)
MCH: 31.9 pg (ref 26.0–34.0)
MCHC: 34.4 g/dL (ref 30.0–36.0)
MCV: 92.8 fL (ref 78.0–100.0)
Platelets: 222 10*3/uL (ref 150–400)
RBC: 3.73 MIL/uL — ABNORMAL LOW (ref 3.87–5.11)
RDW: 12.2 % (ref 11.5–15.5)
WBC: 12.2 10*3/uL — ABNORMAL HIGH (ref 4.0–10.5)

## 2016-10-03 MED ORDER — ZOLPIDEM TARTRATE 5 MG PO TABS
5.0000 mg | ORAL_TABLET | Freq: Once | ORAL | Status: AC
  Administered 2016-10-03: 5 mg via ORAL
  Filled 2016-10-03: qty 1

## 2016-10-03 NOTE — Progress Notes (Signed)
1 Day Post-Op Procedure(s) (LRB): HYSTERECTOMY ABDOMINAL (N/A) SALPINGO OOPHORECTOMY (Bilateral) ABDOMINO SACROCOLPOPEXY (N/A) ANTERIOR (CYSTOCELE) AND POSTERIOR REPAIR (RECTOCELE) (N/A) TRANSVAGINAL TAPE (TVT) PROCEDURE exact midurethral sling (N/A) CYSTOSCOPY (N/A)  Subjective: Patient reports + flatus.   Vaginal packing not out. Ambulated last night.   Objective: I have reviewed patient's vital signs, intake and output and labs. Vitals:   10/03/16 0131 10/03/16 0443  BP:  (!) 120/57  Pulse:  66  Resp: 16 16  Temp:  98.4 F (36.9 C)   I/O - 3712 cc/1650 cc WBC - 12.2 Hgb - 11.9 Glucose - 136  General: alert and cooperative Resp: clear to auscultation bilaterally Cardio: regular rate and rhythm, S1, S2 normal, no murmur, click, rub or gallop GI: soft, non-tender; bowel sounds normal; no masses,  no organomegaly and incision: clean, dry and intact Vaginal Bleeding: minimal Does have rales in the lung bases bilaterally.   Assessment: s/p Procedure(s) with comments: HYSTERECTOMY ABDOMINAL (N/A) - 4 hours SALPINGO OOPHORECTOMY (Bilateral) ABDOMINO SACROCOLPOPEXY (N/A) ANTERIOR (CYSTOCELE) AND POSTERIOR REPAIR (RECTOCELE) (N/A) TRANSVAGINAL TAPE (TVT) PROCEDURE exact midurethral sling (N/A) CYSTOSCOPY (N/A): stable  Plan: Advance diet Encourage ambulation Advance to PO medication Vaginal packing out by me now. Minimal blood noted. Use incentive spirometer.   LOS: 1 day    [employer] 10/03/2016, 8:00 AM

## 2016-10-04 MED ORDER — OXYCODONE-ACETAMINOPHEN 5-325 MG PO TABS: 1.0000 | ORAL_TABLET | ORAL | 0 refills | Status: DC | PRN

## 2016-10-04 MED ORDER — IBUPROFEN 600 MG PO TABS: 600.0000 mg | ORAL_TABLET | Freq: Four times a day (QID) | ORAL | 0 refills | Status: DC | PRN

## 2016-10-04 NOTE — Discharge Instructions (Signed)
Abdominal Hysterectomy, Care After Refer to this sheet in the next few weeks. These instructions provide you with information on caring for yourself after your procedure. Your health care provider may also give you more specific instructions. Your treatment has been planned according to current medical practices, but problems sometimes occur. Call your health care provider if you have any problems or questions after your procedure. What can I expect after the procedure? After your procedure, it is typical to have the following:  Pain.  Feeling tired.  Poor appetite.  Less interest in sex. It takes 4-6 weeks to recover from this surgery. Follow these instructions at home:  Take pain medicines only as directed by your health care provider. Do not take over-the-counter pain medicines without checking with your health care provider first.  Change your bandage as directed by your health care provider.  Return to your health care provider to have your sutures taken out.  Take showers instead of baths for 2-3 weeks. Ask your health care provider when it is safe to start showering.  Do not douche, use tampons, or have sexual intercourse for at least 6 weeks or until your health care provider says you can.  Follow your health care provider's advice about exercise, lifting, driving, and general activities.  Get plenty of rest and sleep.  Do not lift anything heavier than a gallon of milk (about 10 lb [4.5 kg]) for the first month after surgery.  You can resume your normal diet if your health care provider says it is okay.  Do not drink alcohol until your health care provider says you can.  If you are constipated, ask your health care provider if you can take a mild laxative.  Eating foods high in fiber may also help with constipation. Eat plenty of raw fruits and vegetables, whole grains, and beans.  Drink enough fluids to keep your urine clear or pale yellow.  Try to have someone at  home with you for the first 1-2 weeks to help around the house.  Keep all follow-up appointments. Contact a health care provider if:  You have chills or fever.  You have swelling, redness, or pain in the area of your incision that is getting worse.  You have pus coming from the incision.  You notice a bad smell coming from the incision or bandage.  Your incision breaks open.  You feel dizzy or light-headed.  You have pain or bleeding when you urinate.  You have persistent diarrhea.  You have persistent nausea and vomiting.  You have abnormal vaginal discharge.  You have a rash.  You have any type of abnormal reaction or develop an allergy to your medicine.  Your pain medicine is not helping. Get help right away if:  You have a fever and your symptoms suddenly get worse.  You have severe abdominal pain.  You have chest pain.  You have shortness of breath.  You faint.  You have pain, swelling, or redness of your leg.  You have heavy vaginal bleeding with blood clots. This information is not intended to replace advice given to you by your health care provider. Make sure you discuss any questions you have with your health care provider. Document Released: 04/11/2005 Document Revised: 03/05/2016 Document Reviewed: 07/15/2013 Elsevier Interactive Patient Education  2017 [place] After Refer to this sheet in the next few weeks. These instructions provide you with information on caring for yourself after your procedure. Your health care provider may also  give you more specific instructions. Your treatment has been planned according to current medical practices, but problems sometimes occur. Call your health care provider if you have any problems or questions after your procedure.  WHAT TO EXPECT AFTER THE PROCEDURE  After your procedure, it is typical to have the following:  Pain.  Some vaginal bleeding.  Fatigue. HOME CARE INSTRUCTIONS   Medicine   Only take medicines as directed by your health care provider.  Make sure to finish antibiotic medicine even if you start to feel better. Bandages   Care for your bandages and cuts (incisions) as directed by your surgeon.  Do not shower until after your bandages have been removed. Activity   Take at least two 10-minute walks each day.  Ask your health care provider when you can return to work and do all your usual activities. It may take 3 months to recover completely.  You will have to restrict many activities when you first return home. For at least 6 weeks:  Do not lift anything heavier than a gallon of milk.  Do not sit on a bike seat.  Do not use a tampon or put anything inside your vagina.  Do not have sexual intercourse.  Do not participate instrenuous activities like running or aerobics. Other Instructions   Do not take a bath or go swimming until your health care provider says you can. Most people can shower after about 6 weeks after the procedure.  Drink enough fluids to keep your urine clear or pale yellow. This helps prevent constipation.  See your health care provider to have your stitches or staples removed if necessary.  Keep all follow-up appointments. SEEK MEDICAL CARE IF:   You have chills or fever.  Your pain medicine is not helping.  You have vaginal bleeding or discharge that will not go away. SEEK IMMEDIATE MEDICAL CARE IF:   You have a fever for more than 2-3 days.  You have very bad pain.  You have heavy vaginal bleeding or discharge.  You have any signs of infection around your cut (incision). Watch for:  Redness.  Tenderness.  Warmth.  Pus or other discharge.  You develop a warm, tender area in your leg.  You have chest pain or trouble breathing. This information is not intended to replace advice given to you by your health care provider. Make sure you discuss any questions you have with your health care  provider. Document Released: 09/27/2013 Document Reviewed: 09/27/2013 Elsevier Interactive Patient Education  2017 [employer]  Anterior and Posterior Colporrhaphy, Sling Procedure, Care After Refer to this sheet in the next few weeks. These instructions provide you with information on caring for yourself after your procedure. Your health care provider may also give you more specific instructions. Your treatment has been planned according to current medical practices, but problems sometimes occur. Call your health care provider if you have any problems or questions after your procedure.  HOME CARE INSTRUCTIONS  Rest as much as possible during the first 2 weeks after the procedure.   Avoid heavy lifting (more than 10 pounds [4.5 kg]), pushing, or pulling. Limit stair climbing to once or twice a day the first week, then slowly increase this activity.   Avoid standing for prolonged periods of time.   Talk with your health care provider about when you may resume your usual physical activity.   You may resume your normal diet right away.   Drink at least 6-8 glasses of non-caffeinated beverages per  day.   Eat a well-balanced diet. Daily portions of food from the meat (protein), milk, fruit, vegetable, and bread families are necessary for your health.   Your normal bowel function should return. If you become constipated, you may:   Take a mild laxative.  Add fruit and bran to your diet.  Drink more liquids.  You may take a shower and wash your hair.   Only take over-the-counter or prescription medicines as directed by your health care provider.   Clean the incision with water. Do not use a dressing unless the incision is draining or irritated. Check your incision daily for redness, draining, swelling, or separation of the skin.   Follow any bladder care instructions provided by your health care provider.   Keep your perineal area (the area between vagina and rectum)  clean and dry. Perform perineal care after every bowel movement and each time you urinate. You may take a sitz bath or sit in a tub of clean, warm water when necessary, unless your health care provider tells you otherwise.   Do not have sexual intercourse until permitted by your health care provider.   Follow up with your health care provider as directed.  SEEK MEDICAL CARE IF:  You have shaking chills.   Your pain is not relieved with medicine or becomes worse.  You have frequent or urgent urination, or you are unable to completely empty your bladder.   You feel a burning sensation when urinating.   You see pus coming from the wounds.  SEEK IMMEDIATE MEDICAL CARE IF:  You develop a fever.  You notice redness, drainage, swelling, or separation of the skin at the incision site.  You have difficulty breathing.  You are unable to urinate. MAKE SURE YOU:   Understand these instructions.  Will watch your condition.  Will get help right away if you are not doing well or get worse. This information is not intended to replace advice given to you by your health care provider. Make sure you discuss any questions you have with your health care provider. Document Released: 05/06/2004 Document Revised: 05/25/2013 Document Reviewed: 02/11/2013 Elsevier Interactive Patient Education  2017 [employer].

## 2016-10-04 NOTE — Progress Notes (Signed)
Pt discharged with written instructions. Pt verbalized an understanding. No concerns noted. Sheera Illingworth L Letonia Stead, RN 

## 2016-10-04 NOTE — Progress Notes (Signed)
2 Days Post-Op Procedure(s) (LRB): HYSTERECTOMY ABDOMINAL (N/A) SALPINGO OOPHORECTOMY (Bilateral) ABDOMINO SACROCOLPOPEXY (N/A) ANTERIOR (CYSTOCELE) AND POSTERIOR REPAIR (RECTOCELE) (N/A) TRANSVAGINAL TAPE (TVT) PROCEDURE exact midurethral sling (N/A) CYSTOSCOPY (N/A)  Subjective: Patient reports tolerating PO, + flatus and no problems voiding.   Voided 3 times now.  Total is about 550+.  PVR is 40 cc by [place].  Flatus.  Pain is under control  Took Ambien to help sleep las hs.  States 2 pad changes this am.   Has received Lovenox preop and then yesterday am.   Final patho - adenomyosis and leiomyoma. Inactive endometrium.  Benign tubes and ovaries.   Objective: I have reviewed patient's vital signs and intake and output. Vitals:   10/03/16 1946 10/04/16 0753  BP: (!) 153/71 (!) 145/79  Pulse: 77 78  Resp: 18 16  Temp: 98.5 F (36.9 C) 98.7 F (37.1 C)   UO - 3125 cc.  General: alert and cooperative Resp: clear to auscultation bilaterally Cardio: regular rate and rhythm, S1, S2 normal, no murmur, click, rub or gallop GI: soft, non-tender; bowel sounds normal; no masses,  no organomegaly and incision: clean, dry and intact Extremities: Ted hose off. Vaginal Bleeding: minimal  Assessment: s/p Procedure(s) with comments: HYSTERECTOMY ABDOMINAL (N/A) - 4 hours SALPINGO OOPHORECTOMY (Bilateral) ABDOMINO SACROCOLPOPEXY (N/A) ANTERIOR (CYSTOCELE) AND POSTERIOR REPAIR (RECTOCELE) (N/A) TRANSVAGINAL TAPE (TVT) PROCEDURE exact midurethral sling (N/A) CYSTOSCOPY (N/A): progressing well  Voiding well and ready for discharge.  Plan: Discharge home No further Lovenox.   Follow up in 5 days, sooner if needed.  Percocet and Motrin for pain.  Instruction given in verbal and written form.   LOS: 2 days    Christina Buchanan 10/04/2016, 10:13 AM

## 2016-10-05 NOTE — Discharge Summary (Signed)
Physician Discharge Summary  Patient ID: Christina Buchanan MRN: [id number] DOB/AGE: September 17, 1956 60 y.o.  Admit date: 10/02/2016 Discharge date:  10/04/16 Admission Diagnoses: 1.  Postmenopausal bleeding. 2.  Incomplete uterovaginal prolapse. 3.  Genuine stress incontinence. 4.  Adult onset diabetes mellitus, diet controlled.  Discharge Diagnoses:  1.  Postmenopausal bleeding. 2.  Incomplete uterovaginal prolapse. 3.  Genuine stress incontinence. 4.  Adult onset diabetes mellitus, diet controlled. 5.  Total abdominal hysterectomy with bilateral salpingo-oophorectomy, abdominal sacral colpopexy, anterior and posterior colporrhaphy, TVT Exact midurethral sling and cystoscopy   Active Problems:   Status post total abdominal hysterectomy and bilateral salpingo-oophorectomy  Discharged Condition: good  Hospital Course: The patient was admitted on 10/02/16 for a total abdominal hysterectomy with bilateral salpingo-oophorectomy, abdominal sacral colpopexy, anterior and posterior colporrhaphy, TVT Exact midurethral sling and cystoscopy which were performed without complication while under general anesthesia.  The patient's post op course was uneventful.  She had a morphine PCA and Toradol for pain control initially, and this was converted over to Percocet and Motrin on post op day one when the patient began taking po well.  She was given a low carb diet.  She ambulated independently and wore PAS and Ted hose for DVT prophylaxis while in bed.  She also received Lovenox preop and post op on day number one.  Her vaginal packing was removed on post op day number one and there was very light bleeding.  On the day of discharge, she changed her pad twice in the morning.  Her foley catheter were removed on post op day two, and she voided good volumes. Her post void residual was 40 cc. The patient's vital signs remained stable and she demonstrated no signs of infection during her hospitalization.  The patient's  post op day one Hgb was 11.9.   She was tolerating the this well.  Her incision(s) demonstrated no signs of erythema or significant drainage.  She was found to be in good condition and ready for discharge on post op day two.  Consults: None  Significant Diagnostic Studies: labs: See Hospital course.  Treatments: surgery:  Total abdominal hysterectomy with bilateral salpingo-oophorectomy, abdominal sacral colpopexy, anterior and posterior colporrhaphy, TVT Exact midurethral sling and cystoscopy on 10/02/16.  Discharge Exam: Blood pressure (!) 145/79, pulse 78, temperature 98.7 F (37.1 C), temperature source Oral, resp. rate 16, height 5\' 3"  (1.6 m), weight 156 lb (70.8 kg), last menstrual period 03/06/2006, SpO2 97 %. General: alert and cooperative Resp: clear to auscultation bilaterally Cardio: regular rate and rhythm, S1, S2 normal, no murmur, click, rub or gallop GI: soft, non-tender; bowel sounds normal; no masses,  no organomegaly and incision: clean, dry and intact Extremities: Ted hose off. Vaginal Bleeding: minimal  Disposition: 01-Home or Self Care Instructions were reviewed in verbal and written form prior to discharge.  All surgical findings, procedure, and pathology report reviewed with patient.  She is given surgical post op care precautions including bleeding parameters.  She will call if she is changing a pad every 1 or 2 hours.  Allergies as of 10/04/2016   No Known Allergies     Medication List    STOP taking these medications   aspirin 81 MG tablet   OVER THE COUNTER MEDICATION     TAKE these medications   azelastine 0.1 % nasal spray Commonly known as:  ASTELIN USE ONE SPRAY IN EACH NOSTRIL DAILY AS NEEDED What changed:  how much to take  how to take this  when  to take this  additional instructions   cetirizine 10 MG tablet Commonly known as:  ZYRTEC Take 10 mg by mouth daily as needed for allergies.   escitalopram 10 MG tablet Commonly known  as:  LEXAPRO Take 1 tablet (10 mg total) by mouth daily.   ibuprofen 600 MG tablet Commonly known as:  ADVIL,MOTRIN Take 1 tablet (600 mg total) by mouth every 6 (six) hours as needed (mild pain).   oxyCODONE-acetaminophen 5-325 MG tablet Commonly known as:  PERCOCET/ROXICET Take 1-2 tablets by mouth every 4 (four) hours as needed for severe pain (moderate to severe pain (when tolerating fluids)).   simvastatin 20 MG tablet Commonly known as:  ZOCOR Take 1 tablet by mouth daily at 6 PM ---- Needs appt for further refills   vitamin B-12 1000 MCG tablet Commonly known as:  CYANOCOBALAMIN Take 5,000 mcg by mouth daily.   Vitamin D (Ergocalciferol) 50000 units Caps capsule Commonly known as:  DRISDOL Take 50,000 Units by mouth every 7 (seven) days. mondays      Follow-up Information    Arloa Koh, MD In 5 days.   Specialty:  Obstetrics and Gynecology Contact information: [address] [place] [address] [phone]           Signed: Arloa Koh 10/05/2016, 10:41 AM

## 2016-10-09 ENCOUNTER — Encounter: Payer: Self-pay | Admitting: Obstetrics and Gynecology

## 2016-10-09 ENCOUNTER — Ambulatory Visit (INDEPENDENT_AMBULATORY_CARE_PROVIDER_SITE_OTHER): Payer: BLUE CROSS/BLUE SHIELD | Admitting: Obstetrics and Gynecology

## 2016-10-09 VITALS — BP 128/70 | HR 80 | Temp 97.5°F | Resp 14 | Wt 152.0 lb

## 2016-10-09 DIAGNOSIS — Z9889 Other specified postprocedural states: Secondary | ICD-10-CM

## 2016-10-09 NOTE — Progress Notes (Signed)
GYNECOLOGY  VISIT   HPI: 61 y.o.   Married  Caucasian  female   [phone] with Patient's last menstrual period was 03/06/2006 (approximate).   here for 1 week follow up HYSTERECTOMY ABDOMINAL (N/A Abdomen) SALPINGO OOPHORECTOMY (Bilateral Abdomen) [id number] CPT (R)] ABDOMINO SACROCOLPOPEXY (N/A Abdomen) ANTERIOR (CYSTOCELE) AND POSTERIOR REPAIR (RECTOCELE) (N/A Vagina ) TRANSVAGINAL TAPE (TVT) PROCEDURE exact midurethral sling (N/A Vagina )CYSTOSCOPY (N/A Urethra).  Voiding well.  Having BMs.  Feels discomfort with bowel movements coming down the intestinal tract.  Ibuprofen only for pain.  Occasional spotting.  Pad change twice a day.  Wants to return to work in about 1 week.  She is a Forensic psychologist.  GYNECOLOGIC HISTORY: Patient's last menstrual period was 03/06/2006 (approximate). Contraception:  Postmenopausal/TAH Menopausal hormone therapy:  n/a Last mammogram:  02-27-16 Density C/Neg/BiRads1:TBC Last pap smear:   04-23-15 Neg:Neg HR HPV        OB History    Gravida Para Term Preterm AB Living   3 3 3  0 0 3   SAB TAB Ectopic Multiple Live Births   0 0 0 0 3         Patient Active Problem List   Diagnosis Date Noted  . Status post total abdominal hysterectomy and bilateral salpingo-oophorectomy 10/02/2016  . Diabetes ([place]) 08/12/2016  . Pure hypercholesterolemia 04/29/2016  . PMB (postmenopausal bleeding) 04/29/2016  . 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 Hematuria 04/29/2016  . Estrogen deficiency 04/29/2016  . Fibroid, uterine 04/29/2016  . Elevated LFTs 12/12/2011  . Vitamin D deficiency 01/29/2010  . Hyperlipidemia 01/21/2010  . SMOKER 01/21/2010  . Depression 01/21/2010  . ALLERGIC RHINITIS 01/21/2010  . Personal history of colonic polyps 07/30/2006    Past Medical History:  Diagnosis Date  . ALLERGIC RHINITIS   . Arthritis    hands  . DEPRESSION   . Diabetes mellitus without complication ([place]) 123456   diet controlled  . HYPERLIPIDEMIA   . Personal history of  colonic polyps   . SMOKER   . VITAMIN D DEFICIENCY     Past Surgical History:  Procedure Laterality Date  . ABDOMINAL HYSTERECTOMY N/A 10/02/2016   Procedure: HYSTERECTOMY ABDOMINAL;  Surgeon: Nunzio Cobbs, MD;  Location: [facility];  Service: Gynecology;  Laterality: N/A;  4 hours  . ABDOMINAL SACROCOLPOPEXY N/A 10/02/2016   Procedure: ABDOMINO SACROCOLPOPEXY;  Surgeon: Nunzio Cobbs, MD;  Location: [facility];  Service: Gynecology;  Laterality: N/A;  . ANTERIOR AND POSTERIOR REPAIR N/A 10/02/2016   Procedure: ANTERIOR (CYSTOCELE) AND POSTERIOR REPAIR (RECTOCELE);  Surgeon: Nunzio Cobbs, MD;  Location: [facility];  Service: Gynecology;  Laterality: N/A;  . BLADDER SUSPENSION N/A 10/02/2016   Procedure: TRANSVAGINAL TAPE (TVT) PROCEDURE exact midurethral sling;  Surgeon: Nunzio Cobbs, MD;  Location: [facility];  Service: Gynecology;  Laterality: N/A;  . BREAST SURGERY  1990   breast biopsy  . COLONOSCOPY W/ BIOPSIES  9/07   2 polyps & tubuvillous adenoma  . COLONOSCOPY W/ BIOPSIES  11/13/08   sigmoid ddiverticuli and rechek in 5 years - Dr. Collene Mares  . CYSTOSCOPY N/A 10/02/2016   Procedure: CYSTOSCOPY;  Surgeon: Nunzio Cobbs, MD;  Location: [facility];  Service: Gynecology;  Laterality: N/A;  . ENDOMETRIAL ABLATION  08/21/03   HTA  . 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 SALPINGOOPHORECTOMY Bilateral 10/02/2016   Procedure: SALPINGO OOPHORECTOMY;  Surgeon: Everardo All  Yisroel Ramming, MD;  Location: [facility];  Service: Gynecology;  Laterality: Bilateral;  . UPPER GASTROINTESTINAL ENDOSCOPY  06/15/06   2 polyps - H pylori    Current Outpatient Prescriptions  Medication Sig Dispense Refill  . azelastine (ASTELIN) 0.1 % nasal spray USE ONE SPRAY IN EACH NOSTRIL DAILY AS NEEDED (Patient taking differently: Place 1 spray into both nostrils daily. ) 90 mL 0  . cetirizine (ZYRTEC) 10 MG tablet Take  10 mg by mouth daily as needed for allergies.     [name] escitalopram (LEXAPRO) 10 MG tablet Take 1 tablet (10 mg total) by mouth daily. 90 tablet 4  . ibuprofen (ADVIL,MOTRIN) 600 MG tablet Take 1 tablet (600 mg total) by mouth every 6 (six) hours as needed (mild pain). 30 tablet 0  . simvastatin (ZOCOR) 20 MG tablet Take 1 tablet by mouth daily at 6 PM ---- Needs appt for further refills 90 tablet 0  . vitamin B-12 (CYANOCOBALAMIN) 1000 MCG tablet Take 5,000 mcg by mouth daily.    . Vitamin D, Ergocalciferol, (DRISDOL) 50000 units CAPS capsule Take 50,000 Units by mouth every 7 (seven) days. mondays    . oxyCODONE-acetaminophen (PERCOCET/ROXICET) 5-325 MG tablet Take 1-2 tablets by mouth every 4 (four) hours as needed for severe pain (moderate to severe pain (when tolerating fluids)). (Patient not taking: Reported on 10/09/2016) 30 tablet 0   No current facility-administered medications for this visit.      ALLERGIES: Patient has no known allergies.  Family History  Problem Relation Age of Onset  . Breast cancer Mother 57  . Diabetes Mother   . Stroke Mother            . Hyperlipidemia Mother   . Heart failure Mother   . Hypertension Mother   . COPD Mother   . Hypertension Father   . Heart disease Father   . Hyperlipidemia Father   . Heart failure Father     dec at age 61  . Breast cancer Sister 55  . Heart disease Sister     Afib  . Psoriasis Son     Social History   Social History  . Marital status: Married    Spouse name: N/A  . Number of children: N/A  . Years of education: N/A   Occupational History  . Not on file.   Social History Main Topics  . Smoking status: Light Tobacco Smoker    Packs/day: 0.30    Years: 30.00    Types: Cigarettes    Last attempt to quit: 02/04/2015  . Smokeless tobacco: Never Used     Comment: Married, lives with spouse. Cares for elderly parent and in-laws  . Alcohol use 0.0 oz/week     Comment: Rare glass of wine  . Drug use: No  .  Sexual activity: Yes    Partners: Male    Birth control/ protection: Other-see comments     Comment: husband had vasectomy   Other Topics Concern  . Not on file   Social History Narrative   Lives with husband in a one story home.  She has 3 grown children.     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     Education: high school.        No regular exercise    ROS:  Pertinent items are noted in HPI.  PHYSICAL EXAMINATION:    BP 128/70 (BP Location: Right Arm, Patient Position: Sitting, Cuff Size: Normal)   Pulse 80   Temp 97.5  F (36.4 C) (Oral)   Resp 14   Wt 152 lb (68.9 kg)   LMP 03/06/2006 (Approximate)   BMI 26.93 kg/m     General appearance: alert, cooperative and appears stated age   Abdomen: Pfannenstiel and SP incisions intact, soft, non-tender, no masses,  no organomegaly   Pelvic: External genitalia:  no lesions              Urethra:  normal appearing urethra with no masses, tenderness or lesions                Bimanual Exam:  Uterus:  Absent.              Adnexa: no mass, fullness, tenderness              Apical mesh and suburethral mesh protected.  Suture lines intact and no vaginal agglutination.              Chaperone was present for exam.  ASSESSMENT  Status post HYSTERECTOMY ABDOMINAL  BILATERAL SALPINGO-OOPHORECTOMY   ABDOMINO SACROCOLPOPEXY  ANTERIOR (CYSTOCELE) AND POSTERIOR REPAIR (RECTOCELE)   TRANSVAGINAL TAPE (TVT) PROCEDURE exact midurethral sling, CYSTOSCOPY . Doing well.   PLAN  Continue decreased activity.  Driving restrictions, lifting restrictions, work restrictions, and exercise restrictions discussed. 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  Follow up for 6 week post op check.    An After Visit Summary was printed and given to the patient.

## 2016-10-24 ENCOUNTER — Other Ambulatory Visit: Payer: Self-pay | Admitting: Obstetrics and Gynecology

## 2016-10-24 NOTE — Telephone Encounter (Signed)
Medication refill request: Ibuprofen Last AEX:  04/29/16 PG Next AEX: 05/01/17 PG (11/13/16 6 wk post op w/BS) Last MMG (if hormonal medication request): 02/27/16 BIRADS1, Density C, [facility] Refill authorized: 10/04/16 #30 0R. Please advise. Thank you.

## 2016-11-03 ENCOUNTER — Encounter: Payer: Self-pay | Admitting: Obstetrics and Gynecology

## 2016-11-13 ENCOUNTER — Ambulatory Visit (INDEPENDENT_AMBULATORY_CARE_PROVIDER_SITE_OTHER): Payer: BLUE CROSS/BLUE SHIELD | Admitting: Obstetrics and Gynecology

## 2016-11-13 ENCOUNTER — Encounter: Payer: Self-pay | Admitting: Obstetrics and Gynecology

## 2016-11-13 VITALS — BP 124/76 | HR 76 | Ht 63.75 in | Wt 152.0 lb

## 2016-11-13 DIAGNOSIS — Z9889 Other specified postprocedural states: Secondary | ICD-10-CM

## 2016-11-13 MED ORDER — ESTRADIOL 0.1 MG/GM VA CREA: TOPICAL_CREAM | VAGINAL | 0 refills | Status: DC

## 2016-11-13 NOTE — Progress Notes (Signed)
GYNECOLOGY  VISIT   HPI: 61 y.o.   Married  Caucasian  female   [phone] with Patient's last menstrual period was 03/06/2006 (approximate).   here for 6 week follow up HYSTERECTOMY ABDOMINAL (N/A Abdomen) SALPINGO OOPHORECTOMY (Bilateral Abdomen) [id number] CPT (R)] ABDOMINO SACROCOLPOPEXY (N/A Abdomen) ANTERIOR (CYSTOCELE) AND POSTERIOR REPAIR (RECTOCELE) (N/A Vagina ) TRANSVAGINAL TAPE (TVT) PROCEDURE exact midurethral sling (N/A Vagina )CYSTOSCOPY (N/A Urethra).  Good bladder function.  Voiding well.  Took stool softeners for a period of time.  Now does not need them.  No more bleeding or spotting.  No longer wearing a pad.   Doing decreased activity.    GYNECOLOGIC HISTORY: Patient's last menstrual period was 03/06/2006 (approximate). Contraception:  Postmenopausal/Hysterectomy Menopausal hormone therapy:  none Last mammogram: 02-27-16  C/Neg/1:TBC   Last pap smear:  04-23-15 Neg:Neg HR HPV         OB History    Gravida Para Term Preterm AB Living   3 3 3  0 0 3   SAB TAB Ectopic Multiple Live Births   0 0 0 0 3         Patient Active Problem List   Diagnosis Date Noted  . Status post total abdominal hysterectomy and bilateral salpingo-oophorectomy 10/02/2016  . Diabetes ([place]) 08/12/2016  . Pure hypercholesterolemia 04/29/2016  . PMB (postmenopausal bleeding) 04/29/2016  . 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 Hematuria 04/29/2016  . Estrogen deficiency 04/29/2016  . Fibroid, uterine 04/29/2016  . Elevated LFTs 12/12/2011  . Vitamin D deficiency 01/29/2010  . Hyperlipidemia 01/21/2010  . SMOKER 01/21/2010  . Depression 01/21/2010  . ALLERGIC RHINITIS 01/21/2010  . Personal history of colonic polyps 07/30/2006    Past Medical History:  Diagnosis Date  . ALLERGIC RHINITIS   . Arthritis    hands  . DEPRESSION   . Diabetes mellitus without complication ([place]) 123456   diet controlled  . HYPERLIPIDEMIA   . Personal history of colonic polyps   . SMOKER   . VITAMIN D DEFICIENCY      Past Surgical History:  Procedure Laterality Date  . ABDOMINAL HYSTERECTOMY N/A 10/02/2016   Procedure: HYSTERECTOMY ABDOMINAL;  Surgeon: Nunzio Cobbs, MD;  Location: [facility];  Service: Gynecology;  Laterality: N/A;  4 hours  . ABDOMINAL SACROCOLPOPEXY N/A 10/02/2016   Procedure: ABDOMINO SACROCOLPOPEXY;  Surgeon: Nunzio Cobbs, MD;  Location: [facility];  Service: Gynecology;  Laterality: N/A;  . ANTERIOR AND POSTERIOR REPAIR N/A 10/02/2016   Procedure: ANTERIOR (CYSTOCELE) AND POSTERIOR REPAIR (RECTOCELE);  Surgeon: Nunzio Cobbs, MD;  Location: [facility];  Service: Gynecology;  Laterality: N/A;  . BLADDER SUSPENSION N/A 10/02/2016   Procedure: TRANSVAGINAL TAPE (TVT) PROCEDURE exact midurethral sling;  Surgeon: Nunzio Cobbs, MD;  Location: [facility];  Service: Gynecology;  Laterality: N/A;  . BREAST SURGERY  1990   breast biopsy  . COLONOSCOPY W/ BIOPSIES  9/07   2 polyps & tubuvillous adenoma  . COLONOSCOPY W/ BIOPSIES  11/13/08   sigmoid ddiverticuli and rechek in 5 years - Dr. Collene Mares  . CYSTOSCOPY N/A 10/02/2016   Procedure: CYSTOSCOPY;  Surgeon: Nunzio Cobbs, MD;  Location: [facility];  Service: Gynecology;  Laterality: N/A;  . ENDOMETRIAL ABLATION  08/21/03   HTA  . 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 SALPINGOOPHORECTOMY Bilateral 10/02/2016   Procedure: SALPINGO OOPHORECTOMY;  Surgeon: Nunzio Cobbs, MD;  Location:  [facility];  Service: Gynecology;  Laterality: Bilateral;  . UPPER GASTROINTESTINAL ENDOSCOPY  06/15/06   2 polyps - H pylori    Current Outpatient Prescriptions  Medication Sig Dispense Refill  . azelastine (ASTELIN) 0.1 % nasal spray USE ONE SPRAY IN EACH NOSTRIL DAILY AS NEEDED (Patient taking differently: Place 1 spray into both nostrils daily. ) 90 mL 0  . cetirizine (ZYRTEC) 10 MG tablet Take 10 mg by mouth daily as needed for allergies.     [name]  escitalopram (LEXAPRO) 10 MG tablet Take 1 tablet (10 mg total) by mouth daily. 90 tablet 4  . simvastatin (ZOCOR) 20 MG tablet Take 1 tablet by mouth daily at 6 PM ---- Needs appt for further refills 90 tablet 0  . vitamin B-12 (CYANOCOBALAMIN) 1000 MCG tablet Take 5,000 mcg by mouth daily.    . Vitamin D, Ergocalciferol, (DRISDOL) 50000 units CAPS capsule Take 50,000 Units by mouth every 7 (seven) days. mondays     No current facility-administered medications for this visit.      ALLERGIES: Patient has no known allergies.  Family History  Problem Relation Age of Onset  . Breast cancer Mother 60  . Diabetes Mother   . Stroke Mother            . Hyperlipidemia Mother   . Heart failure Mother   . Hypertension Mother   . COPD Mother   . Hypertension Father   . Heart disease Father   . Hyperlipidemia Father   . Heart failure Father     dec at age 23  . Breast cancer Sister 68  . Heart disease Sister     Afib  . Psoriasis Son     Social History   Social History  . Marital status: Married    Spouse name: N/A  . Number of children: N/A  . Years of education: N/A   Occupational History  . Not on file.   Social History Main Topics  . Smoking status: Light Tobacco Smoker    Packs/day: 0.30    Years: 30.00    Types: Cigarettes    Last attempt to quit: 02/04/2015  . Smokeless tobacco: Never Used     Comment: Married, lives with spouse. Cares for elderly parent and in-laws  . Alcohol use 0.0 oz/week     Comment: Rare glass of wine  . Drug use: No  . Sexual activity: Yes    Partners: Male    Birth control/ protection: Other-see comments     Comment: husband had vasectomy   Other Topics Concern  . Not on file   Social History Narrative   Lives with husband in a one story home.  She has 3 grown children.     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     Education: high school.        No regular exercise    ROS:  Pertinent items are noted in HPI.  PHYSICAL EXAMINATION:    BP  124/76 (BP Location: Right Arm, Patient Position: Sitting, Cuff Size: Normal)   Pulse 76   Ht 5' 3.75" (1.619 m)   Wt 152 lb (68.9 kg)   LMP 03/06/2006 (Approximate)   BMI 26.30 kg/m     General appearance: alert, cooperative and appears stated age   Abdomen: Pfannenstiel incision intact, soft, non-tender, no masses,  no organomegaly    Pelvic: External genitalia:  SP incisions intact, no lesions  Urethra:  normal appearing urethra with no masses, tenderness or lesions              Bartholins and Skenes: normal                 Vagina: normal appearing vagina with normal color and discharge, no lesions.  Sutures present and mesh and sling protected.              Cervix: absent.                Bimanual Exam:  Uterus:  Absent.              Adnexa: no mass, fullness, tenderness                Chaperone was present for exam.  ASSESSMENT  Healing well post op.   PLAN  Continue decreased activity.  Start using vaginal Estrace cream in 2 weeks.  Instructed in used.  Discussed potential breast effect including increased risk of breast cancer, although this is a low risk. Follow up for 3 month post op visit.   An After Visit Summary was printed and given to the patient.

## 2016-12-25 ENCOUNTER — Encounter: Payer: Self-pay | Admitting: Obstetrics and Gynecology

## 2016-12-25 ENCOUNTER — Ambulatory Visit (INDEPENDENT_AMBULATORY_CARE_PROVIDER_SITE_OTHER): Payer: BLUE CROSS/BLUE SHIELD | Admitting: Obstetrics and Gynecology

## 2016-12-25 VITALS — BP 130/80 | HR 86 | Resp 20 | Ht 63.75 in | Wt 157.6 lb

## 2016-12-25 DIAGNOSIS — Z9889 Other specified postprocedural states: Secondary | ICD-10-CM

## 2016-12-25 NOTE — Progress Notes (Signed)
GYNECOLOGY  VISIT   HPI: 61 y.o.   Married  Caucasian  female   [phone] with Patient's last menstrual period was 03/06/2006 (approximate).   here for 3 month follow up HYSTERECTOMY ABDOMINAL (N/A Abdomen) SALPINGO OOPHORECTOMY (Bilateral Abdomen) [TXM46 CPT (R)] ABDOMINO SACROCOLPOPEXY (N/A Abdomen) ANTERIOR (CYSTOCELE) AND POSTERIOR REPAIR (RECTOCELE) (N/A Vagina ) TRANSVAGINAL TAPE (TVT) PROCEDURE exact midurethral sling (N/A Vagina )CYSTOSCOPY (N/A Urethra)  No vaginal bleeding.  Voiding well and no incontinence.  Normal bowel movements.  Feels she has normal bowel function now. Always thought she had IBS in the past, but not now.  States it has been a challenge not to do her usual activities and routine for the last 3 months.   Using Estrace cream.   GYNECOLOGIC HISTORY: Patient's last menstrual period was 03/06/2006 (approximate). Contraception:  Postmenopausal/Hysterectomy Menopausal hormone therapy:  none Last mammogram: 02-27-16 Density C/Neg/BiRads1:TBC     Last pap smear: 04-23-15 Neg:Neg HR HPV;02-17-12 Neg            OB History    Gravida Para Term Preterm AB Living   3 3 3  0 0 3   SAB TAB Ectopic Multiple Live Births   0 0 0 0 3         Patient Active Problem List   Diagnosis Date Noted  . Status post total abdominal hysterectomy and bilateral salpingo-oophorectomy 10/02/2016  . Diabetes ([place]) 08/12/2016  . Pure hypercholesterolemia 04/29/2016  . PMB (postmenopausal bleeding) 04/29/2016  . 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 Hematuria 04/29/2016  . Estrogen deficiency 04/29/2016  . Fibroid, uterine 04/29/2016  . Elevated LFTs 12/12/2011  . Vitamin D deficiency 01/29/2010  . Hyperlipidemia 01/21/2010  . SMOKER 01/21/2010  . Depression 01/21/2010  . ALLERGIC RHINITIS 01/21/2010  . Personal history of colonic polyps 07/30/2006    Past Medical History:  Diagnosis Date  . ALLERGIC RHINITIS   . Arthritis    hands  . DEPRESSION   . Diabetes mellitus without  complication ([place]) 8032   diet controlled  . HYPERLIPIDEMIA   . Personal history of colonic polyps   . SMOKER   . VITAMIN D DEFICIENCY     Past Surgical History:  Procedure Laterality Date  . ABDOMINAL HYSTERECTOMY N/A 10/02/2016   Procedure: HYSTERECTOMY ABDOMINAL;  Surgeon: Nunzio Cobbs, MD;  Location: [facility];  Service: Gynecology;  Laterality: N/A;  4 hours  . ABDOMINAL SACROCOLPOPEXY N/A 10/02/2016   Procedure: ABDOMINO SACROCOLPOPEXY;  Surgeon: Nunzio Cobbs, MD;  Location: [facility];  Service: Gynecology;  Laterality: N/A;  . ANTERIOR AND POSTERIOR REPAIR N/A 10/02/2016   Procedure: ANTERIOR (CYSTOCELE) AND POSTERIOR REPAIR (RECTOCELE);  Surgeon: Nunzio Cobbs, MD;  Location: [facility];  Service: Gynecology;  Laterality: N/A;  . BLADDER SUSPENSION N/A 10/02/2016   Procedure: TRANSVAGINAL TAPE (TVT) PROCEDURE exact midurethral sling;  Surgeon: Nunzio Cobbs, MD;  Location: [facility];  Service: Gynecology;  Laterality: N/A;  . BREAST SURGERY  1990   breast biopsy  . COLONOSCOPY W/ BIOPSIES  9/07   2 polyps & tubuvillous adenoma  . COLONOSCOPY W/ BIOPSIES  11/13/08   sigmoid ddiverticuli and rechek in 5 years - Dr. Collene Mares  . CYSTOSCOPY N/A 10/02/2016   Procedure: CYSTOSCOPY;  Surgeon: Nunzio Cobbs, MD;  Location: [facility];  Service: Gynecology;  Laterality: N/A;  . ENDOMETRIAL ABLATION  08/21/03   HTA  . RIGHT COLECTOMY  07/30/06   partial colectomy for tubulovillous adenoma w/o  high grade dysplasia  . SALPINGOOPHORECTOMY Bilateral 10/02/2016   Procedure: SALPINGO OOPHORECTOMY;  Surgeon: Nunzio Cobbs, MD;  Location: [facility];  Service: Gynecology;  Laterality: Bilateral;  . UPPER GASTROINTESTINAL ENDOSCOPY  06/15/06   2 polyps - H pylori    Current Outpatient Prescriptions  Medication Sig Dispense Refill  . azelastine (ASTELIN) 0.1 % nasal spray USE ONE SPRAY IN EACH NOSTRIL DAILY AS NEEDED (Patient taking differently: Place  1 spray into both nostrils daily. ) 90 mL 0  . cetirizine (ZYRTEC) 10 MG tablet Take 10 mg by mouth daily as needed for allergies.     [name] escitalopram (LEXAPRO) 10 MG tablet Take 1 tablet (10 mg total) by mouth daily. 90 tablet 4  . estradiol (ESTRACE) 0.1 MG/GM vaginal cream Use 1/2 g vaginally two times per week at bed time. 42.5 g 0  . simvastatin (ZOCOR) 20 MG tablet Take 1 tablet by mouth daily at 6 PM ---- Needs appt for further refills 90 tablet 0  . vitamin B-12 (CYANOCOBALAMIN) 1000 MCG tablet Take 5,000 mcg by mouth daily.    . Vitamin D, Ergocalciferol, (DRISDOL) 50000 units CAPS capsule Take 50,000 Units by mouth every 7 (seven) days. mondays     No current facility-administered medications for this visit.      ALLERGIES: Patient has no known allergies.  Family History  Problem Relation Age of Onset  . Breast cancer Mother 86  . Diabetes Mother   . Stroke Mother            . Hyperlipidemia Mother   . Heart failure Mother   . Hypertension Mother   . COPD Mother   . Hypertension Father   . Heart disease Father   . Hyperlipidemia Father   . Heart failure Father     dec at age 58  . Breast cancer Sister 56  . Heart disease Sister     Afib  . Psoriasis Son     Social History   Social History  . Marital status: Married    Spouse name: N/A  . Number of children: N/A  . Years of education: N/A   Occupational History  . Not on file.   Social History Main Topics  . Smoking status: Light Tobacco Smoker    Packs/day: 0.30    Years: 30.00    Types: Cigarettes    Last attempt to quit: 02/04/2015  . Smokeless tobacco: Never Used     Comment: Married, lives with spouse. Cares for elderly parent and in-laws  . Alcohol use 0.0 oz/week     Comment: Rare glass of wine  . Drug use: No  . Sexual activity: Yes    Partners: Male    Birth control/ protection: Other-see comments     Comment: husband had vasectomy   Other Topics Concern  . Not on file   Social History  Narrative   Lives with husband in a one story home.  She has 3 grown children.     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     Education: high school.        No regular exercise    ROS:  Pertinent items are noted in HPI.  PHYSICAL EXAMINATION:    BP 130/80 (BP Location: Right Arm, Patient Position: Sitting, Cuff Size: Normal)   Pulse 86   Resp 20   Ht 5' 3.75" (1.619 m)   Wt 157 lb 9.6 oz (71.5 kg)   LMP 03/06/2006 (Approximate)   BMI  27.26 kg/m     General appearance: alert, cooperative and appears stated age  Abdomen: incision intact, soft, non-tender, no masses,  no organomegaly   Pelvic: External genitalia:  no lesions              Urethra:  normal appearing urethra with no masses, tenderness or lesions              Bartholins and Skenes: normal                 Vagina: normal appearing vagina with normal color and discharge, no lesions.  Good support. Mesh and sling protected.              Cervix:  Absent.                 Bimanual Exam:  Uterus: absent.              Adnexa: no mass, fullness, tenderness             Chaperone was present for exam.  ASSESSMENT  Doing well post op.  FH breast cancer.   PLAN  Reduce vaginal estrogen cream to 1/2 gram once weekly until annual exam in July with [facility].  Return gradually to all normal activity including exercise, sexual activity, and lifting.  Patient expresses appreciation for her care.    An After Visit Summary was printed and given to the patient.

## 2017-02-07 ENCOUNTER — Other Ambulatory Visit: Payer: Self-pay | Admitting: Internal Medicine

## 2017-02-07 ENCOUNTER — Other Ambulatory Visit: Payer: Self-pay | Admitting: Nurse Practitioner

## 2017-02-09 NOTE — Telephone Encounter (Signed)
Medication refill request: Vitamin D 50,000 Last AEX:  04/29/16 PG Next AEX: 05/01/17  Last MMG (if hormonal medication request): 02/27/16 BIRADS 1 negative/density c Refill authorized: 06/26/16; last Vitamin D checked 04/29/16 -- 41  Please advise on refill

## 2017-02-10 NOTE — Progress Notes (Signed)
Subjective:    Patient ID: Christina Buchanan, female    DOB: [birth year], 61 y.o.   MRN: [id number]  HPI The patient is here for follow up.  Diabetes: She is taking her medication daily as prescribed. She is compliant with a diabetic diet. She is eating few calories and eating a very healthy diet.  She is active, but not exercising regularly.  She is up-to-date with an ophthalmology examination.   Hyperlipidemia: She is taking her medication daily. She is compliant with a low fat/cholesterol diet. She is active during hte day, but not exercising regularly. She denies myalgias.   Smoking 5 cigarettes a day.  She only smokes them at night when reading - it is her way to relax.  She knows she needs to quit.   Medications and allergies reviewed with patient and updated if appropriate.  Patient Active Problem List   Diagnosis Date Noted  . Status post total abdominal hysterectomy and bilateral salpingo-oophorectomy 10/02/2016  . Diabetes ([place]) 08/12/2016  . Pure hypercholesterolemia 04/29/2016  . PMB (postmenopausal bleeding) 04/29/2016  . 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 Hematuria 04/29/2016  . Estrogen deficiency 04/29/2016  . Fibroid, uterine 04/29/2016  . Elevated LFTs 12/12/2011  . Vitamin D deficiency 01/29/2010  . Hyperlipidemia 01/21/2010  . SMOKER 01/21/2010  . Depression 01/21/2010  . ALLERGIC RHINITIS 01/21/2010  . Personal history of colonic polyps 07/30/2006    Current Outpatient Prescriptions on File Prior to Visit  Medication Sig Dispense Refill  . azelastine (ASTELIN) 0.1 % nasal spray USE ONE SPRAY IN EACH NOSTRIL DAILY AS NEEDED (Patient taking differently: Place 1 spray into both nostrils daily. ) 90 mL 0  . cetirizine (ZYRTEC) 10 MG tablet Take 10 mg by mouth daily as needed for allergies.     [name] escitalopram (LEXAPRO) 10 MG tablet Take 1 tablet (10 mg total) by mouth daily. 90 tablet 4  . estradiol (ESTRACE) 0.1 MG/GM vaginal cream Use 1/2 g vaginally two times per week  at bed time. 42.5 g 0  . simvastatin (ZOCOR) 20 MG tablet Take 1 tablet (20 mg total) by mouth daily at 6 PM. 90 tablet 0  . vitamin B-12 (CYANOCOBALAMIN) 1000 MCG tablet Take 5,000 mcg by mouth daily.    . Vitamin D, Ergocalciferol, (DRISDOL) 50000 units CAPS capsule TAKE ONE CAPSULE BY MOUTH WEEKLY 26 capsule 0   No current facility-administered medications on file prior to visit.     Past Medical History:  Diagnosis Date  . ALLERGIC RHINITIS   . Arthritis    hands  . DEPRESSION   . Diabetes mellitus without complication ([place]) 2297   diet controlled  . HYPERLIPIDEMIA   . Personal history of colonic polyps   . SMOKER   . VITAMIN D DEFICIENCY     Past Surgical History:  Procedure Laterality Date  . ABDOMINAL HYSTERECTOMY N/A 10/02/2016   Procedure: HYSTERECTOMY ABDOMINAL;  Surgeon: Nunzio Cobbs, MD;  Location: [facility];  Service: Gynecology;  Laterality: N/A;  4 hours  . ABDOMINAL SACROCOLPOPEXY N/A 10/02/2016   Procedure: ABDOMINO SACROCOLPOPEXY;  Surgeon: Nunzio Cobbs, MD;  Location: [facility];  Service: Gynecology;  Laterality: N/A;  . ANTERIOR AND POSTERIOR REPAIR N/A 10/02/2016   Procedure: ANTERIOR (CYSTOCELE) AND POSTERIOR REPAIR (RECTOCELE);  Surgeon: Nunzio Cobbs, MD;  Location: [facility];  Service: Gynecology;  Laterality: N/A;  . BLADDER SUSPENSION N/A 10/02/2016   Procedure: TRANSVAGINAL TAPE (TVT) PROCEDURE exact midurethral sling;  Surgeon:  Brook Oletta Lamas, MD;  Location: [facility];  Service: Gynecology;  Laterality: N/A;  . BREAST SURGERY  1990   breast biopsy  . COLONOSCOPY W/ BIOPSIES  9/07   2 polyps & tubuvillous adenoma  . COLONOSCOPY W/ BIOPSIES  11/13/08   sigmoid ddiverticuli and rechek in 5 years - Dr. Collene Mares  . CYSTOSCOPY N/A 10/02/2016   Procedure: CYSTOSCOPY;  Surgeon: Nunzio Cobbs, MD;  Location: [facility];  Service: Gynecology;  Laterality: N/A;  . ENDOMETRIAL ABLATION  08/21/03   HTA  . RIGHT COLECTOMY   07/30/06   partial colectomy for tubulovillous adenoma w/o high grade dysplasia  . SALPINGOOPHORECTOMY Bilateral 10/02/2016   Procedure: SALPINGO OOPHORECTOMY;  Surgeon: Nunzio Cobbs, MD;  Location: [facility];  Service: Gynecology;  Laterality: Bilateral;  . UPPER GASTROINTESTINAL ENDOSCOPY  06/15/06   2 polyps - H pylori    Social History   Social History  . Marital status: Married    Spouse name: N/A  . Number of children: N/A  . Years of education: N/A   Social History Main Topics  . Smoking status: Light Tobacco Smoker    Packs/day: 0.30    Years: 30.00    Types: Cigarettes    Last attempt to quit: 02/04/2015  . Smokeless tobacco: Never Used     Comment: Married, lives with spouse. Cares for elderly parent and in-laws  . Alcohol use 0.0 oz/week     Comment: Rare glass of wine  . Drug use: No  . Sexual activity: Yes    Partners: Male    Birth control/ protection: Other-see comments     Comment: husband had vasectomy   Other Topics Concern  . Not on file   Social History Narrative   Lives with husband in a one story home.  She has 3 grown children.     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     Education: high school.        No regular exercise    Family History  Problem Relation Age of Onset  . Breast cancer Mother 67  . Diabetes Mother   . Stroke Mother            . Hyperlipidemia Mother   . Heart failure Mother   . Hypertension Mother   . COPD Mother   . Hypertension Father   . Heart disease Father   . Hyperlipidemia Father   . Heart failure Father     dec at age 51  . Breast cancer Sister 10  . Heart disease Sister     Afib  . Psoriasis Son     Review of Systems  Constitutional: Negative for chills and fever.  Eyes: Negative for visual disturbance.  Respiratory: Negative for cough, shortness of breath and wheezing.   Cardiovascular: Negative for chest pain, palpitations and leg swelling.  Gastrointestinal: Negative for nausea.  Neurological:  Positive for numbness (right hand since surgery). Negative for light-headedness and headaches.       Objective:   Vitals:   02/11/17 0933  BP: 132/78  Pulse: 85  Resp: 16  Temp: 98.6 F (37 C)   Wt Readings from Last 3 Encounters:  02/11/17 157 lb (71.2 kg)  12/25/16 157 lb 9.6 oz (71.5 kg)  11/13/16 152 lb (68.9 kg)   Body mass index is 27.16 kg/m.   Physical Exam    Constitutional: Appears well-developed and well-nourished. No distress.  HENT:  Head: Normocephalic and atraumatic.  Neck: Neck supple. No tracheal deviation present. No thyromegaly present.  No cervical lymphadenopathy Cardiovascular: Normal rate, regular rhythm and normal heart sounds.   No murmur heard. No carotid bruit .  No edema Pulmonary/Chest: Effort normal and breath sounds normal. No respiratory distress. No has no wheezes. No rales.  Skin: Skin is warm and dry. Not diaphoretic.  Psychiatric: Normal mood and affect. Behavior is normal.      Assessment & Plan:    See Problem List for Assessment and Plan of chronic medical problems.

## 2017-02-11 ENCOUNTER — Encounter: Payer: Self-pay | Admitting: Internal Medicine

## 2017-02-11 ENCOUNTER — Ambulatory Visit (INDEPENDENT_AMBULATORY_CARE_PROVIDER_SITE_OTHER): Payer: BLUE CROSS/BLUE SHIELD | Admitting: Internal Medicine

## 2017-02-11 ENCOUNTER — Other Ambulatory Visit (INDEPENDENT_AMBULATORY_CARE_PROVIDER_SITE_OTHER): Payer: BLUE CROSS/BLUE SHIELD

## 2017-02-11 VITALS — BP 132/78 | HR 85 | Temp 98.6°F | Resp 16 | Wt 157.0 lb

## 2017-02-11 DIAGNOSIS — F17200 Nicotine dependence, unspecified, uncomplicated: Secondary | ICD-10-CM | POA: Diagnosis not present

## 2017-02-11 DIAGNOSIS — E119 Type 2 diabetes mellitus without complications: Secondary | ICD-10-CM | POA: Diagnosis not present

## 2017-02-11 DIAGNOSIS — E7800 Pure hypercholesterolemia, unspecified: Secondary | ICD-10-CM

## 2017-02-11 LAB — LIPID PANEL
Cholesterol: 156 mg/dL (ref 0–200)
HDL: 43.5 mg/dL (ref >39.00)
LDL Cholesterol: 84 mg/dL (ref 0–99)
NonHDL: 112.53
Total CHOL/HDL Ratio: 4
Triglycerides: 143 mg/dL (ref 0.0–149.0)
VLDL: 28.6 mg/dL (ref 0.0–40.0)

## 2017-02-11 LAB — COMPREHENSIVE METABOLIC PANEL
ALT: 23 U/L (ref 0–35)
AST: 17 U/L (ref 0–37)
Albumin: 4.4 g/dL (ref 3.5–5.2)
Alkaline Phosphatase: 64 U/L (ref 39–117)
BUN: 18 mg/dL (ref 6–23)
CO2: 26 mEq/L (ref 19–32)
Calcium: 9.5 mg/dL (ref 8.4–10.5)
Chloride: 105 mEq/L (ref 96–112)
Creatinine, Ser: 0.65 mg/dL (ref 0.40–1.20)
GFR: 98.15 mL/min (ref >60.00)
Glucose, Bld: 188 mg/dL — ABNORMAL HIGH (ref 70–99)
Potassium: 4 mEq/L (ref 3.5–5.1)
Sodium: 138 mEq/L (ref 135–145)
Total Bilirubin: 0.5 mg/dL (ref 0.2–1.2)
Total Protein: 7.3 g/dL (ref 6.0–8.3)

## 2017-02-11 LAB — HEMOGLOBIN A1C: Hgb A1c MFr Bld: 7.2 % — ABNORMAL HIGH (ref 4.6–6.5)

## 2017-02-11 LAB — MICROALBUMIN / CREATININE URINE RATIO
Creatinine,U: 152.9 mg/dL
Microalb Creat Ratio: 3.3 mg/g (ref 0.0–30.0)
Microalb, Ur: 5 mg/dL — ABNORMAL HIGH (ref 0.0–1.9)

## 2017-02-11 NOTE — Assessment & Plan Note (Signed)
Check lipid panel (not fasting) Continue daily statin Regular exercise and healthy diet encouraged  

## 2017-02-11 NOTE — Patient Instructions (Addendum)
  Test(s) ordered today. Your results will be released to MyChart (or called to you) after review, usually within 72hours after test completion. If any changes need to be made, you will be notified at that same time.  Medications reviewed and updated.  No changes recommended at this time.    Please followup in 6 months   

## 2017-02-11 NOTE — Assessment & Plan Note (Signed)
Diet controlled Check urine micro, a1c Compliant with a diabetic diet Active, but not exercising Trying to lose weight, but has gained a few lbs since TAH surgery Advised increasing exercise - cardio/weights Eye exam yearly f/u in 6 months

## 2017-02-11 NOTE — Progress Notes (Deleted)
Pre visit review using our clinic review tool, if applicable. No additional management support is needed unless otherwise documented below in the visit note. 

## 2017-02-11 NOTE — Assessment & Plan Note (Signed)
Stressed smoking cessation - smokes 5 cigs at night only

## 2017-02-13 ENCOUNTER — Encounter: Payer: Self-pay | Admitting: Internal Medicine

## 2017-04-01 ENCOUNTER — Ambulatory Visit (INDEPENDENT_AMBULATORY_CARE_PROVIDER_SITE_OTHER): Payer: BLUE CROSS/BLUE SHIELD | Admitting: Nurse Practitioner

## 2017-04-01 ENCOUNTER — Encounter: Payer: Self-pay | Admitting: Nurse Practitioner

## 2017-04-01 VITALS — BP 124/62 | HR 68 | Ht 63.75 in | Wt 152.0 lb

## 2017-04-01 DIAGNOSIS — E559 Vitamin D deficiency, unspecified: Secondary | ICD-10-CM | POA: Diagnosis not present

## 2017-04-01 DIAGNOSIS — Z01419 Encounter for gynecological examination (general) (routine) without abnormal findings: Secondary | ICD-10-CM | POA: Diagnosis not present

## 2017-04-01 DIAGNOSIS — Z0000 Encounter for general adult medical examination without abnormal findings: Secondary | ICD-10-CM

## 2017-04-01 MED ORDER — VITAMIN D (ERGOCALCIFEROL) 1.25 MG (50000 UNIT) PO CAPS: 50000.0000 [IU] | ORAL_CAPSULE | ORAL | 1 refills | Status: DC

## 2017-04-01 MED ORDER — FLUCONAZOLE 150 MG PO TABS: 150.0000 mg | ORAL_TABLET | Freq: Once | ORAL | 1 refills | Status: AC

## 2017-04-01 MED ORDER — TRIAMCINOLONE ACETONIDE 0.025 % EX OINT: 1.0000 "application " | TOPICAL_OINTMENT | Freq: Two times a day (BID) | CUTANEOUS | 1 refills | Status: DC

## 2017-04-01 MED ORDER — NYSTATIN 100000 UNIT/GM EX CREA: 1.0000 "application " | TOPICAL_CREAM | Freq: Two times a day (BID) | CUTANEOUS | 1 refills | Status: DC

## 2017-04-01 NOTE — Progress Notes (Signed)
Patient ID: Christina Buchanan, female   DOB: [birth year], 61 y.o.   MRN: [id number]  61 y.o. [id number] Married  Caucasian Fe here for annual exam. S/P TAH/BSO; abdominal sacral colpopexy, TVT, mid urethral sling.  No vaginal pain. She still has flares of the vulva getting itching and irritation feeling a lot of the time.  Not on vaginal estrogen any longer.   No urinary problems or infections since surgery.  She is very appreciative of care from Dr. Quincy Simmonds.  Patient's last menstrual period was 03/06/2006 (approximate).          Sexually active: Yes.    The current method of family planning is status post hysterectomy and post menopausal status.    Exercising: No.  The patient does not participate in regular exercise at present. Smoker:  Yes, 3 cigarettes a day  Health Maintenance: Pap: 04/23/15, Negative with neg HR HPV  02/17/12, Negative History of Abnormal Pap: yes MMG: 02/27/16, 3D-yes, Density Category C, Bi-Rads 1:  Negative Self Breast exams: yes Colonoscopy: 09/24/15, Tubular Adenoma, repeat in 5 years BMD: 10/20/06 Z Score: +0.4 Spine / -0.4 Left Femur Neck TDaP: 02/17/12 Shingles: Never Pneumonia: Not indicated due to age Hep C: 10/04/15 HIV: 04/29/16 Labs: PCP and surgery labs    reports that she has been smoking Cigarettes.  She has a 9.00 pack-year smoking history. She has never used smokeless tobacco. She reports that she drinks alcohol. She reports that she does not use drugs.  Past Medical History:  Diagnosis Date  . ALLERGIC RHINITIS   . Arthritis    hands  . DEPRESSION   . Diabetes mellitus without complication ([place]) 2202   diet controlled  . Fibroid, uterine 04/29/2016  . HYPERLIPIDEMIA   . Personal history of colonic polyps   . SMOKER   . VITAMIN D DEFICIENCY     Past Surgical History:  Procedure Laterality Date  . ABDOMINAL HYSTERECTOMY N/A 10/02/2016   Procedure: HYSTERECTOMY ABDOMINAL;  Surgeon: Nunzio Cobbs, MD;  Location: [facility];  Service:  Gynecology;  Laterality: N/A;  4 hours  . ABDOMINAL SACROCOLPOPEXY N/A 10/02/2016   Procedure: ABDOMINO SACROCOLPOPEXY;  Surgeon: Nunzio Cobbs, MD;  Location: [facility];  Service: Gynecology;  Laterality: N/A;  . ANTERIOR AND POSTERIOR REPAIR N/A 10/02/2016   Procedure: ANTERIOR (CYSTOCELE) AND POSTERIOR REPAIR (RECTOCELE);  Surgeon: Nunzio Cobbs, MD;  Location: [facility];  Service: Gynecology;  Laterality: N/A;  . BLADDER SUSPENSION N/A 10/02/2016   Procedure: TRANSVAGINAL TAPE (TVT) PROCEDURE exact midurethral sling;  Surgeon: Nunzio Cobbs, MD;  Location: [facility];  Service: Gynecology;  Laterality: N/A;  . BREAST SURGERY  1990   breast biopsy  . COLONOSCOPY W/ BIOPSIES  9/07   2 polyps & tubuvillous adenoma  . COLONOSCOPY W/ BIOPSIES  11/13/08   sigmoid ddiverticuli and rechek in 5 years - Dr. Collene Mares  . CYSTOSCOPY N/A 10/02/2016   Procedure: CYSTOSCOPY;  Surgeon: Nunzio Cobbs, MD;  Location: [facility];  Service: Gynecology;  Laterality: N/A;  . ENDOMETRIAL ABLATION  08/21/03   HTA  . 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 SALPINGOOPHORECTOMY Bilateral 10/02/2016   Procedure: SALPINGO OOPHORECTOMY;  Surgeon: Nunzio Cobbs, MD;  Location: [facility];  Service: Gynecology;  Laterality: Bilateral;  . UPPER GASTROINTESTINAL ENDOSCOPY  06/15/06   2 polyps - H pylori    Current Outpatient Prescriptions  Medication Sig Dispense Refill  . azelastine (ASTELIN) 0.1 % nasal spray USE ONE SPRAY IN EACH NOSTRIL DAILY AS NEEDED (Patient taking differently: Place 1 spray into both nostrils daily. ) 90 mL 0  . cetirizine (ZYRTEC) 10 MG tablet Take 10 mg by mouth daily as needed for allergies.     [name] escitalopram (LEXAPRO) 10 MG tablet Take 1 tablet (10 mg total) by mouth daily. 90 tablet 4  . simvastatin (ZOCOR) 20 MG tablet Take 1 tablet (20 mg total) by mouth daily at 6 PM. 90 tablet 0  . vitamin  B-12 (CYANOCOBALAMIN) 1000 MCG tablet Take 5,000 mcg by mouth daily.    . Vitamin D, Ergocalciferol, (DRISDOL) 50000 units CAPS capsule TAKE ONE CAPSULE BY MOUTH WEEKLY 26 capsule 0   No current facility-administered medications for this visit.     Family History  Problem Relation Age of Onset  . Breast cancer Mother 62  . Diabetes Mother   . Stroke Mother               . Hyperlipidemia Mother   . Heart failure Mother   . Hypertension Mother   . COPD Mother   . Hypertension Father   . Heart disease Father   . Hyperlipidemia Father   . Heart failure Father        dec at age 68  . Breast cancer Sister 70  . Heart disease Sister        Afib  . Psoriasis Son     ROS:  Pertinent items are noted in HPI.  Otherwise, a comprehensive ROS was negative.  Exam:   BP 124/62 (BP Location: Right Arm, Patient Position: Sitting, Cuff Size: Large)   Pulse 68   Ht 5' 3.75" (1.619 m)   Wt 152 lb (68.9 kg)   LMP 03/06/2006 (Approximate)   BMI 26.30 kg/m  Height: 5' 3.75" (161.9 cm) Ht Readings from Last 3 Encounters:  04/01/17 5' 3.75" (1.619 m)  12/25/16 5' 3.75" (1.619 m)  11/13/16 5' 3.75" (1.619 m)    General appearance: alert, cooperative and appears stated age Head: Normocephalic, without obvious abnormality, atraumatic Neck: no adenopathy, supple, symmetrical, trachea midline and thyroid normal to inspection and palpation Lungs: clear to auscultation bilaterally Breasts: normal appearance, no masses or tenderness Heart: regular rate and rhythm Abdomen: soft, non-tender; no masses,  no organomegaly Extremities: extremities normal, atraumatic, no cyanosis or edema Skin: Skin color, texture, turgor normal. No rashes or lesions Lymph nodes: Cervical, supraclavicular, and axillary nodes normal. No abnormal inguinal nodes palpated Neurologic: Grossly normal   Pelvic: External genitalia:  no lesions              Urethra:  normal appearing urethra with no masses, tenderness or  lesions              Bartholin's and Skene's: normal                 Vagina: normal appearing vagina with normal color and discharge, no lesions              Cervix: absent              Pap taken: No. Bimanual Exam:  Uterus:  uterus absent              Adnexa: no mass, fullness, tenderness               Rectovaginal: Confirms  Anus:  normal sphincter tone, no lesions  Chaperone present: yes  A:  Well Woman with normal exam  S/P TAH with BSO, abdominal sacral colpopexy, AP colporrhaphy, TVT mid urethral sling and cysto 10/02/16  HTN, Vit D deficiency  Vulvitis - chronic    P:   Reviewed health and wellness pertinent to exam  Pap smear: no  Mammogram is due now and will schedule  Refill Vit d and will follow with labs  RX Triamcinolone and mix with Nystatin prn to vulva BID prn  Rx Diflucan 150 mg for her upcoming trip  Counseled on breast self exam, mammography screening, adequate intake of calcium and vitamin D, diet and exercise return annually or prn  An After Visit Summary was printed and given to the patient.

## 2017-04-01 NOTE — Patient Instructions (Signed)

## 2017-04-02 LAB — VITAMIN D 25 HYDROXY (VIT D DEFICIENCY, FRACTURES): Vit D, 25-Hydroxy: 45.6 ng/mL (ref 30.0–100.0)

## 2017-04-02 NOTE — Progress Notes (Signed)
Encounter reviewed by Dr. Brook Amundson C. Silva.  

## 2017-04-05 ENCOUNTER — Other Ambulatory Visit: Payer: Self-pay | Admitting: Internal Medicine

## 2017-05-01 ENCOUNTER — Ambulatory Visit: Payer: BLUE CROSS/BLUE SHIELD | Admitting: Nurse Practitioner

## 2017-05-03 ENCOUNTER — Other Ambulatory Visit: Payer: Self-pay | Admitting: Internal Medicine

## 2017-05-05 ENCOUNTER — Other Ambulatory Visit: Payer: Self-pay | Admitting: Emergency Medicine

## 2017-05-05 MED ORDER — AZELASTINE HCL 0.1 % NA SOLN: NASAL | 0 refills | Status: DC

## 2017-05-09 ENCOUNTER — Other Ambulatory Visit: Payer: Self-pay | Admitting: Internal Medicine

## 2017-05-09 ENCOUNTER — Other Ambulatory Visit: Payer: Self-pay | Admitting: Nurse Practitioner

## 2017-05-11 NOTE — Telephone Encounter (Signed)
Medication refill request: Lexapro 10mg  Last AEX:  04/01/17 PG Next AEX: 04/02/18 BS Last MMG (if hormonal medication request): 02/27/16 BIRADS 1 negative/density c Refill authorized: 04/29/16 #90 w/4 refills; today please advise

## 2017-06-18 ENCOUNTER — Other Ambulatory Visit: Payer: Self-pay | Admitting: Internal Medicine

## 2017-06-18 DIAGNOSIS — Z1231 Encounter for screening mammogram for malignant neoplasm of breast: Secondary | ICD-10-CM

## 2017-06-29 ENCOUNTER — Ambulatory Visit
Admission: RE | Admit: 2017-06-29 | Discharge: 2017-06-29 | Disposition: A | Discharge status: Home / Self Care | Payer: BLUE CROSS/BLUE SHIELD | Source: Ambulatory Visit | Attending: Internal Medicine | Admitting: Internal Medicine

## 2017-06-29 DIAGNOSIS — Z1231 Encounter for screening mammogram for malignant neoplasm of breast: Secondary | ICD-10-CM

## 2017-08-14 ENCOUNTER — Encounter: Payer: BLUE CROSS/BLUE SHIELD | Admitting: Internal Medicine

## 2017-08-18 ENCOUNTER — Encounter: Payer: BLUE CROSS/BLUE SHIELD | Admitting: Internal Medicine

## 2017-09-07 NOTE — Patient Instructions (Addendum)
Test(s) ordered today. Your results will be released to [facility] (or called to you) after review, usually within 72hours after test completion. If any changes need to be made, you will be notified at that same time.  All other Health Maintenance issues reviewed.   All recommended immunizations and age-appropriate screenings are up-to-date or discussed.  Flu immunization administered today.   Medications reviewed and updated.  No changes recommended at this time.   Please followup in 6 months   Health Maintenance, Female Adopting a healthy lifestyle and getting preventive care can go a long way to promote health and wellness. Talk with your health care provider about what schedule of regular examinations is right for you. This is a good chance for you to check in with your provider about disease prevention and staying healthy. In between checkups, there are plenty of things you can do on your own. Experts have done a lot of research about which lifestyle changes and preventive measures are most likely to keep you healthy. Ask your health care provider for more information. Weight and diet Eat a healthy diet  Be sure to include plenty of vegetables, fruits, low-fat dairy products, and lean protein.  Do not eat a lot of foods high in solid fats, added sugars, or salt.  Get regular exercise. This is one of the most important things you can do for your health. ? Most adults should exercise for at least 150 minutes each week. The exercise should increase your heart rate and make you sweat (moderate-intensity exercise). ? Most adults should also do strengthening exercises at least twice a week. This is in addition to the moderate-intensity exercise.  Maintain a healthy weight  Body mass index (BMI) is a measurement that can be used to identify possible weight problems. It estimates body fat based on height and weight. Your health care provider can help determine your BMI and help you achieve or  maintain a healthy weight.  For females 6 years of age and older: ? A BMI below 18.5 is considered underweight. ? A BMI of 18.5 to 24.9 is normal. ? A BMI of 25 to 29.9 is considered overweight. ? A BMI of 30 and above is considered obese.  Watch levels of cholesterol and blood lipids  You should start having your blood tested for lipids and cholesterol at 63 years of age, then have this test every 5 years.  You may need to have your cholesterol levels checked more often if: ? Your lipid or cholesterol levels are high. ? You are older than 62 years of age. ? You are at high risk for heart disease.  Cancer screening Lung Cancer  Lung cancer screening is recommended for adults 60-12 years old who are at high risk for lung cancer because of a history of smoking.  A yearly low-dose CT scan of the lungs is recommended for people who: ? Currently smoke. ? Have quit within the past 15 years. ? Have at least a 30-pack-year history of smoking. A pack year is smoking an average of one pack of cigarettes a day for 1 year.  Yearly screening should continue until it has been 15 years since you quit.  Yearly screening should stop if you develop a health problem that would prevent you from having lung cancer treatment.  Breast Cancer  Practice breast self-awareness. This means understanding how your breasts normally appear and feel.  It also means doing regular breast self-exams. Let your health care provider know about any changes,  no matter how small.  If you are in your 20s or 30s, you should have a clinical breast exam (CBE) by a health care provider every 1-3 years as part of a regular health exam.  If you are 40 or older, have a CBE every year. Also consider having a breast X-ray (mammogram) every year.  If you have a family history of breast cancer, talk to your health care provider about genetic screening.  If you are at high risk for breast cancer, talk to your health care  provider about having an MRI and a mammogram every year.  Breast cancer gene (BRCA) assessment is recommended for women who have family members with BRCA-related cancers. BRCA-related cancers include: ? Breast. ? Ovarian. ? Tubal. ? Peritoneal cancers.  Results of the assessment will determine the need for genetic counseling and BRCA1 and BRCA2 testing.  Cervical Cancer Your health care provider may recommend that you be screened regularly for cancer of the pelvic organs (ovaries, uterus, and vagina). This screening involves a pelvic examination, including checking for microscopic changes to the surface of your cervix (Pap test). You may be encouraged to have this screening done every 3 years, beginning at age 21.  For women ages 30-65, health care providers may recommend pelvic exams and Pap testing every 3 years, or they may recommend the Pap and pelvic exam, combined with testing for human papilloma virus (HPV), every 5 years. Some types of HPV increase your risk of cervical cancer. Testing for HPV may also be done on women of any age with unclear Pap test results.  Other health care providers may not recommend any screening for nonpregnant women who are considered low risk for pelvic cancer and who do not have symptoms. Ask your health care provider if a screening pelvic exam is right for you.  If you have had past treatment for cervical cancer or a condition that could lead to cancer, you need Pap tests and screening for cancer for at least 20 years after your treatment. If Pap tests have been discontinued, your risk factors (such as having a new sexual partner) need to be reassessed to determine if screening should resume. Some women have medical problems that increase the chance of getting cervical cancer. In these cases, your health care provider may recommend more frequent screening and Pap tests.  Colorectal Cancer  This type of cancer can be detected and often prevented.  Routine  colorectal cancer screening usually begins at 62 years of age and continues through 62 years of age.  Your health care provider may recommend screening at an earlier age if you have risk factors for colon cancer.  Your health care provider may also recommend using home test kits to check for hidden blood in the stool.  A small camera at the end of a tube can be used to examine your colon directly (sigmoidoscopy or colonoscopy). This is done to check for the earliest forms of colorectal cancer.  Routine screening usually begins at age 50.  Direct examination of the colon should be repeated every 5-10 years through 62 years of age. However, you may need to be screened more often if early forms of precancerous polyps or small growths are found.  Skin Cancer  Check your skin from head to toe regularly.  Tell your health care provider about any new moles or changes in moles, especially if there is a change in a mole's shape or color.  Also tell your health care provider if you   have a mole that is larger than the size of a pencil eraser.  Always use sunscreen. Apply sunscreen liberally and repeatedly throughout the day.  Protect yourself by wearing long sleeves, pants, a wide-brimmed hat, and sunglasses whenever you are outside.  Heart disease, diabetes, and high blood pressure  High blood pressure causes heart disease and increases the risk of stroke. High blood pressure is more likely to develop in: ? People who have blood pressure in the high end of the normal range (130-139/85-89 mm Hg). ? People who are overweight or obese. ? People who are African American.  If you are 63-7 years of age, have your blood pressure checked every 3-5 years. If you are 5 years of age or older, have your blood pressure checked every year. You should have your blood pressure measured twice-once when you are at a hospital or clinic, and once when you are not at a hospital or clinic. Record the average of the  two measurements. To check your blood pressure when you are not at a hospital or clinic, you can use: ? An automated blood pressure machine at a pharmacy. ? A home blood pressure monitor.  If you are between 71 years and 26 years old, ask your health care provider if you should take aspirin to prevent strokes.  Have regular diabetes screenings. This involves taking a blood sample to check your fasting blood sugar level. ? If you are at a normal weight and have a low risk for diabetes, have this test once every three years after 62 years of age. ? If you are overweight and have a high risk for diabetes, consider being tested at a younger age or more often. Preventing infection Hepatitis B  If you have a higher risk for hepatitis B, you should be screened for this virus. You are considered at high risk for hepatitis B if: ? You were born in a country where hepatitis B is common. Ask your health care provider which countries are considered high risk. ? Your parents were born in a high-risk country, and you have not been immunized against hepatitis B (hepatitis B vaccine). ? You have HIV or AIDS. ? You use needles to inject street drugs. ? You live with someone who has hepatitis B. ? You have had sex with someone who has hepatitis B. ? You get hemodialysis treatment. ? You take certain medicines for conditions, including cancer, organ transplantation, and autoimmune conditions.  Hepatitis C  Blood testing is recommended for: ? Everyone born from [birth year] through 1966. ? Anyone with known risk factors for hepatitis C.  Sexually transmitted infections (STIs)  You should be screened for sexually transmitted infections (STIs) including gonorrhea and chlamydia if: ? You are sexually active and are younger than 62 years of age. ? You are older than 62 years of age and your health care provider tells you that you are at risk for this type of infection. ? Your sexual activity has changed since you  were last screened and you are at an increased risk for chlamydia or gonorrhea. Ask your health care provider if you are at risk.  If you do not have HIV, but are at risk, it may be recommended that you take a prescription medicine daily to prevent HIV infection. This is called pre-exposure prophylaxis (PrEP). You are considered at risk if: ? You are sexually active and do not regularly use condoms or know the HIV status of your partner(s). ? You take drugs by injection. ?  You are sexually active with a partner who has HIV.  Talk with your health care provider about whether you are at high risk of being infected with HIV. If you choose to begin PrEP, you should first be tested for HIV. You should then be tested every 3 months for as long as you are taking PrEP. Pregnancy  If you are premenopausal and you may become pregnant, ask your health care provider about preconception counseling.  If you may become pregnant, take 400 to 800 micrograms (mcg) of folic acid every day.  If you want to prevent pregnancy, talk to your health care provider about birth control (contraception). Osteoporosis and menopause  Osteoporosis is a disease in which the bones lose minerals and strength with aging. This can result in serious bone fractures. Your risk for osteoporosis can be identified using a bone density scan.  If you are 65 years of age or older, or if you are at risk for osteoporosis and fractures, ask your health care provider if you should be screened.  Ask your health care provider whether you should take a calcium or vitamin D supplement to lower your risk for osteoporosis.  Menopause may have certain physical symptoms and risks.  Hormone replacement therapy may reduce some of these symptoms and risks. Talk to your health care provider about whether hormone replacement therapy is right for you. Follow these instructions at home:  Schedule regular health, dental, and eye exams.  Stay current  with your immunizations.  Do not use any tobacco products including cigarettes, chewing tobacco, or electronic cigarettes.  If you are pregnant, do not drink alcohol.  If you are breastfeeding, limit how much and how often you drink alcohol.  Limit alcohol intake to no more than 1 drink per day for nonpregnant women. One drink equals 12 ounces of beer, 5 ounces of wine, or 1 ounces of hard liquor.  Do not use street drugs.  Do not share needles.  Ask your health care provider for help if you need support or information about quitting drugs.  Tell your health care provider if you often feel depressed.  Tell your health care provider if you have ever been abused or do not feel safe at home. This information is not intended to replace advice given to you by your health care provider. Make sure you discuss any questions you have with your health care provider. Document Released: 04/07/2011 Document Revised: 02/28/2016 Document Reviewed: 06/26/2015 Elsevier Interactive Patient Education  2018 Elsevier Inc.  

## 2017-09-07 NOTE — Progress Notes (Signed)
Subjective:    Patient ID: Christina Buchanan, female    DOB: 06-Mar-1956, 61 y.o.   MRN: [id number]  HPI She is here for a physical exam.   She is trying to avoid too many sugars/carb.  She is walking for exercise - walks daily.   She has hemorrhoids.  She is unsure if they are internal or external.  She denies any rectal bleeding.  She sometimes has varying bowel habits depending on what she eats.  She has tried over-the-counter medications for the hemorrhoids but they did not work well.  She wondered about a prescription medication.  She is up-to-date with her colonoscopy.  Medications and allergies reviewed with patient and updated if appropriate.  Patient Active Problem List   Diagnosis Date Noted  . Wears hearing aid in both ears 09/08/2017  . Status post total abdominal hysterectomy and bilateral salpingo-oophorectomy 10/02/2016  . Diabetes ([facility]) 08/12/2016  . Pure hypercholesterolemia 04/29/2016  . PMB (postmenopausal bleeding) 04/29/2016  . Hematuria 04/29/2016  . Estrogen deficiency 04/29/2016  . Vitamin D deficiency 01/29/2010  . SMOKER 01/21/2010  . Depression 01/21/2010  . ALLERGIC RHINITIS 01/21/2010  . Personal history of colonic polyps 07/30/2006    Current Outpatient Medications on File Prior to Visit  Medication Sig Dispense Refill  . azelastine (ASTELIN) 0.1 % nasal spray USE ONE SPRAY IN EACH NOSTRIL DAILY AS NEEDED (Patient taking differently: Place 1 spray into both nostrils daily. ) 90 mL 0  . azelastine (ASTELIN) 0.1 % nasal spray USE ONE SPRAY IN EACH NOSTRIL DAILY AS NEEDED 90 mL 0  . cetirizine (ZYRTEC) 10 MG tablet Take 10 mg by mouth daily as needed for allergies.     [name] escitalopram (LEXAPRO) 10 MG tablet TAKE 1 TABLET (10 MG TOTAL) BY MOUTH DAILY. 90 tablet 2  . nystatin cream (MYCOSTATIN) Apply 1 application topically 2 (two) times daily. Apply to affected area BID 30 g 1  . simvastatin (ZOCOR) 20 MG tablet TAKE 1 TABLET (20 MG TOTAL) BY MOUTH DAILY  AT 6 PM. 90 tablet 1  . triamcinolone (KENALOG) 0.025 % ointment Apply 1 application topically 2 (two) times daily. 30 g 1  . vitamin B-12 (CYANOCOBALAMIN) 1000 MCG tablet Take 5,000 mcg by mouth daily.    . Vitamin D, Ergocalciferol, (DRISDOL) 50000 units CAPS capsule Take 1 capsule (50,000 Units total) by mouth once a week. 30 capsule 1   No current facility-administered medications on file prior to visit.     Past Medical History:  Diagnosis Date  . ALLERGIC RHINITIS   . Arthritis    hands  . DEPRESSION   . Diabetes mellitus without complication ([facility]) 3300   diet controlled  . Fibroid, uterine 04/29/2016  . HYPERLIPIDEMIA   . Personal history of colonic polyps   . SMOKER   . VITAMIN D DEFICIENCY     Past Surgical History:  Procedure Laterality Date  . ABDOMINAL HYSTERECTOMY N/A 10/02/2016   Procedure: HYSTERECTOMY ABDOMINAL;  Surgeon: Nunzio Cobbs, MD;  Location: [facility];  Service: Gynecology;  Laterality: N/A;  4 hours  . ABDOMINAL SACROCOLPOPEXY N/A 10/02/2016   Procedure: ABDOMINO SACROCOLPOPEXY;  Surgeon: Nunzio Cobbs, MD;  Location: [facility];  Service: Gynecology;  Laterality: N/A;  . ANTERIOR AND POSTERIOR REPAIR N/A 10/02/2016   Procedure: ANTERIOR (CYSTOCELE) AND POSTERIOR REPAIR (RECTOCELE);  Surgeon: Nunzio Cobbs, MD;  Location: [facility];  Service: Gynecology;  Laterality: N/A;  . BLADDER SUSPENSION  N/A 10/02/2016   Procedure: TRANSVAGINAL TAPE (TVT) PROCEDURE exact midurethral sling;  Surgeon: Nunzio Cobbs, MD;  Location: [facility];  Service: Gynecology;  Laterality: N/A;  . BREAST SURGERY  1990   breast biopsy  . COLONOSCOPY W/ BIOPSIES  9/07   2 polyps & tubuvillous adenoma  . COLONOSCOPY W/ BIOPSIES  11/13/08   sigmoid ddiverticuli and rechek in 5 years - Dr. Collene Mares  . CYSTOSCOPY N/A 10/02/2016   Procedure: CYSTOSCOPY;  Surgeon: Nunzio Cobbs, MD;  Location: [facility];  Service: Gynecology;  Laterality: N/A;  .  ENDOMETRIAL ABLATION  08/21/03   HTA  . 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 SALPINGOOPHORECTOMY Bilateral 10/02/2016   Procedure: SALPINGO OOPHORECTOMY;  Surgeon: Nunzio Cobbs, MD;  Location: [facility];  Service: Gynecology;  Laterality: Bilateral;  . UPPER GASTROINTESTINAL ENDOSCOPY  06/15/06   2 polyps - H pylori    Social History   Socioeconomic History  . Marital status: Married    Spouse name: None  . Number of children: None  . Years of education: None  . Highest education level: None  Social Needs  . Financial resource strain: None  . Food insecurity - worry: None  . Food insecurity - inability: None  . Transportation needs - medical: None  . Transportation needs - non-medical: None  Occupational History  . None  Tobacco Use  . Smoking status: Light Tobacco Smoker    Packs/day: 0.30    Years: 30.00    Pack years: 9.00    Types: Cigarettes    Last attempt to quit: 02/04/2015    Years since quitting: 2.5  . Smokeless tobacco: Never Used  . Tobacco comment: Married, lives with spouse. Cares for elderly parent and in-laws  Substance and Sexual Activity  . Alcohol use: Yes    Alcohol/week: 0.0 oz    Comment: Rare glass of wine  . Drug use: No  . Sexual activity: Yes    Partners: Male    Birth control/protection: Other-see comments    Comment: husband had vasectomy  Other Topics Concern  . None  Social History Narrative   Lives with husband in a one story home.  She has 3 grown children.     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     Education: high school.        No regular exercise    Family History  Problem Relation Age of Onset  . Breast cancer Mother 56  . Diabetes Mother   . Stroke Mother               . Hyperlipidemia Mother   . Heart failure Mother   . Hypertension Mother   . COPD Mother   . Hypertension Father   . Heart disease Father   . Hyperlipidemia Father   . Heart failure  Father        dec at age 27  . Breast cancer Sister 50  . Heart disease Sister        Afib  . Psoriasis Son     Review of Systems  Constitutional: Negative for chills and fever.  Eyes: Negative for visual disturbance.  Respiratory: Negative for cough, shortness of breath and wheezing.   Cardiovascular: Negative for chest pain, palpitations and leg swelling.  Gastrointestinal: Positive for constipation and diarrhea (alternating loose and constipation). Negative for abdominal pain, blood in stool and nausea.  No gerd  Genitourinary: Negative for dysuria and hematuria.  Musculoskeletal: Positive for arthralgias (hands). Negative for back pain.  Skin: Negative for color change and rash.  Neurological: Negative for light-headedness and headaches.  Psychiatric/Behavioral: Positive for dysphoric mood (controlled). The patient is not nervous/anxious.        Objective:   Vitals:   09/08/17 1525  BP: 132/76  Pulse: 77  Resp: 16  Temp: 98.6 F (37 C)  SpO2: 96%   Filed Weights   09/08/17 1525  Weight: 156 lb (70.8 kg)   Body mass index is 26.99 kg/m.  Wt Readings from Last 3 Encounters:  09/08/17 156 lb (70.8 kg)  04/01/17 152 lb (68.9 kg)  02/11/17 157 lb (71.2 kg)     Physical Exam Constitutional: She appears well-developed and well-nourished. No distress.  HENT:  Head: Normocephalic and atraumatic.  Right Ear: External ear normal. Normal ear canal and TM Left Ear: External ear normal.  Normal ear canal and TM Mouth/Throat: Oropharynx is clear and moist.  Eyes: Conjunctivae and EOM are normal.  Neck: Neck supple. No tracheal deviation present. No thyromegaly present.  No carotid bruit  Cardiovascular: Normal rate, regular rhythm and normal heart sounds.   No murmur heard.  No edema. Pulmonary/Chest: Effort normal and breath sounds normal. No respiratory distress. She has no wheezes. She has no rales.  Breast: deferred to Gyn Abdominal: Soft. She exhibits no  distension. There is no tenderness.  Rectal: Deferred Lymphadenopathy: She has no cervical adenopathy.  Skin: Skin is warm and dry. She is not diaphoretic.  Psychiatric: She has a normal mood and affect. Her behavior is normal.   Diabetic Foot Exam - Simple   Simple Foot Form Diabetic Foot exam was performed with the following findings:  Yes   Visual Inspection No deformities, no ulcerations, no other skin breakdown bilaterally:  Yes Sensation Testing Intact to touch and monofilament testing bilaterally:  Yes Pulse Check Posterior Tibialis and Dorsalis pulse intact bilaterally:  Yes Comments          Assessment & Plan:   Physical exam: Screening blood work  ordered Immunizations   Flu vaccine today, discussed shingrix and pneumonia vaccines Colonoscopy   Up to date  Mammogram   Up to date  Gyn   Up to date  Eye exams   Up to date  EKG   Last done 09/2016 Exercise  Walking dog Weight  BMI acceptable for age Skin   No concerns Substance abuse -  Light tobacco smoker, no alcohol abuse  See Problem List for Assessment and Plan of chronic medical problems.   FU in 6 mo

## 2017-09-08 ENCOUNTER — Other Ambulatory Visit (INDEPENDENT_AMBULATORY_CARE_PROVIDER_SITE_OTHER): Payer: BLUE CROSS/BLUE SHIELD

## 2017-09-08 ENCOUNTER — Ambulatory Visit (INDEPENDENT_AMBULATORY_CARE_PROVIDER_SITE_OTHER): Payer: BLUE CROSS/BLUE SHIELD | Admitting: Internal Medicine

## 2017-09-08 ENCOUNTER — Encounter: Payer: Self-pay | Admitting: Internal Medicine

## 2017-09-08 VITALS — BP 132/76 | HR 77 | Temp 98.6°F | Resp 16 | Ht 63.75 in | Wt 156.0 lb

## 2017-09-08 DIAGNOSIS — Z0000 Encounter for general adult medical examination without abnormal findings: Secondary | ICD-10-CM | POA: Diagnosis not present

## 2017-09-08 DIAGNOSIS — Z23 Encounter for immunization: Secondary | ICD-10-CM

## 2017-09-08 DIAGNOSIS — E7800 Pure hypercholesterolemia, unspecified: Secondary | ICD-10-CM

## 2017-09-08 DIAGNOSIS — K649 Unspecified hemorrhoids: Secondary | ICD-10-CM | POA: Insufficient documentation

## 2017-09-08 DIAGNOSIS — Z974 Presence of external hearing-aid: Secondary | ICD-10-CM | POA: Insufficient documentation

## 2017-09-08 DIAGNOSIS — F17200 Nicotine dependence, unspecified, uncomplicated: Secondary | ICD-10-CM

## 2017-09-08 DIAGNOSIS — F3289 Other specified depressive episodes: Secondary | ICD-10-CM | POA: Diagnosis not present

## 2017-09-08 DIAGNOSIS — E119 Type 2 diabetes mellitus without complications: Secondary | ICD-10-CM

## 2017-09-08 LAB — COMPREHENSIVE METABOLIC PANEL
ALT: 24 U/L (ref 0–35)
AST: 18 U/L (ref 0–37)
Albumin: 4.5 g/dL (ref 3.5–5.2)
Alkaline Phosphatase: 64 U/L (ref 39–117)
BUN: 19 mg/dL (ref 6–23)
CO2: 27 mEq/L (ref 19–32)
Calcium: 9.1 mg/dL (ref 8.4–10.5)
Chloride: 105 mEq/L (ref 96–112)
Creatinine, Ser: 0.59 mg/dL (ref 0.40–1.20)
GFR: 109.56 mL/min (ref >60.00)
Glucose, Bld: 146 mg/dL — ABNORMAL HIGH (ref 70–99)
Potassium: 3.8 mEq/L (ref 3.5–5.1)
Sodium: 139 mEq/L (ref 135–145)
Total Bilirubin: 0.4 mg/dL (ref 0.2–1.2)
Total Protein: 7.3 g/dL (ref 6.0–8.3)

## 2017-09-08 LAB — CBC WITH DIFFERENTIAL/PLATELET
Basophils Absolute: 0.1 10*3/uL (ref 0.0–0.1)
Basophils Relative: 0.7 % (ref 0.0–3.0)
Eosinophils Absolute: 0.1 10*3/uL (ref 0.0–0.7)
Eosinophils Relative: 1.6 % (ref 0.0–5.0)
HCT: 42.7 % (ref 36.0–46.0)
Hemoglobin: 14.4 g/dL (ref 12.0–15.0)
Lymphocytes Relative: 35.2 % (ref 12.0–46.0)
Lymphs Abs: 2.9 10*3/uL (ref 0.7–4.0)
MCHC: 33.7 g/dL (ref 30.0–36.0)
MCV: 94.5 fl (ref 78.0–100.0)
Monocytes Absolute: 0.9 10*3/uL (ref 0.1–1.0)
Monocytes Relative: 10.2 % (ref 3.0–12.0)
Neutro Abs: 4.4 10*3/uL (ref 1.4–7.7)
Neutrophils Relative %: 52.3 % (ref 43.0–77.0)
Platelets: 278 10*3/uL (ref 150.0–400.0)
RBC: 4.52 Mil/uL (ref 3.87–5.11)
RDW: 12.6 % (ref 11.5–15.5)
WBC: 8.4 10*3/uL (ref 4.0–10.5)

## 2017-09-08 LAB — LIPID PANEL
Cholesterol: 143 mg/dL (ref 0–200)
HDL: 40.3 mg/dL (ref >39.00)
LDL Cholesterol: 74 mg/dL (ref 0–99)
NonHDL: 102.49
Total CHOL/HDL Ratio: 4
Triglycerides: 144 mg/dL (ref 0.0–149.0)
VLDL: 28.8 mg/dL (ref 0.0–40.0)

## 2017-09-08 LAB — HEMOGLOBIN A1C: Hgb A1c MFr Bld: 7.1 % — ABNORMAL HIGH (ref 4.6–6.5)

## 2017-09-08 LAB — TSH: TSH: 1.27 u[IU]/mL (ref 0.35–4.50)

## 2017-09-08 MED ORDER — HYDROCORTISONE ACETATE 25 MG RE SUPP: 25.0000 mg | Freq: Two times a day (BID) | RECTAL | 8 refills | Status: DC

## 2017-09-08 MED ORDER — HYDROCORTISONE 2.5 % RE CREA: 1.0000 "application " | TOPICAL_CREAM | Freq: Two times a day (BID) | RECTAL | 5 refills | Status: DC

## 2017-09-08 NOTE — Assessment & Plan Note (Addendum)
Smokes 1-5 cigarettes a day - only smokes in the evening Stressed smoking cessation

## 2017-09-08 NOTE — Assessment & Plan Note (Signed)
Controlled, stable Continue current dose of medication  

## 2017-09-08 NOTE — Assessment & Plan Note (Signed)
Check lipid panel  Continue daily statin Regular exercise and healthy diet encouraged  

## 2017-09-08 NOTE — Assessment & Plan Note (Signed)
Check a1c Low sugar / carb diet Stressed regular exercise, weight loss  

## 2017-09-08 NOTE — Assessment & Plan Note (Signed)
Unsure if they are internal or external Prescription for cream and suppositories If no improvement advised her to call

## 2017-09-09 ENCOUNTER — Encounter: Payer: Self-pay | Admitting: Internal Medicine

## 2017-09-11 MED ORDER — METFORMIN HCL 500 MG PO TABS: 500.0000 mg | ORAL_TABLET | Freq: Every day | ORAL | 3 refills | Status: DC

## 2017-09-11 NOTE — Telephone Encounter (Signed)
Metformin 500mg once daily

## 2017-10-03 ENCOUNTER — Other Ambulatory Visit: Payer: Self-pay | Admitting: Internal Medicine

## 2017-10-11 ENCOUNTER — Encounter: Payer: Self-pay | Admitting: Internal Medicine

## 2017-10-11 DIAGNOSIS — R0683 Snoring: Secondary | ICD-10-CM

## 2017-11-18 ENCOUNTER — Institutional Professional Consult (permissible substitution): Payer: BLUE CROSS/BLUE SHIELD | Admitting: Pulmonary Disease

## 2017-11-25 ENCOUNTER — Ambulatory Visit (INDEPENDENT_AMBULATORY_CARE_PROVIDER_SITE_OTHER): Payer: BLUE CROSS/BLUE SHIELD | Admitting: Pulmonary Disease

## 2017-11-25 ENCOUNTER — Encounter: Payer: Self-pay | Admitting: Pulmonary Disease

## 2017-11-25 VITALS — BP 128/84 | HR 68 | Ht 63.0 in | Wt 155.0 lb

## 2017-11-25 DIAGNOSIS — R29818 Other symptoms and signs involving the nervous system: Secondary | ICD-10-CM | POA: Diagnosis not present

## 2017-11-25 NOTE — Patient Instructions (Signed)
Will arrange for home sleep study Will call to arrange for follow up after sleep study reviewed  

## 2017-11-25 NOTE — Progress Notes (Signed)
[facility]  Chief Complaint  Patient presents with  . sleep consult    Pt referred by Dr. Quay Burow MD. Pt snores loudly snores, gasps for air, stops breathing at times.     Vital signs: BP 128/84 (BP Location: Left Arm, Cuff Size: Normal)   Pulse 68   Ht 5\' 3"  (1.6 m)   Wt 155 lb (70.3 kg)   LMP 03/06/2006 (Approximate)   SpO2 97%   BMI 27.46 kg/m   History of Present Illness: Christina Buchanan is a 62 y.o. female for evaluation of sleep problems.  Her husband has been concerned about her sleep.  She snores, and will stop breathing while asleep.  She doesn't dream much.  Her mouth gets dry.  She doesn't sleep on her back.  She goes to sleep at 1230 am.  She falls asleep within minutes.  She sleeps through the night.  She gets out of bed at 730 am.  She feels groggy in the morning until she has her cup of coffee.  She denies morning headache.  She does not use anything to help her fall sleep.  She denies sleep walking, sleep talking, bruxism, or nightmares.  There is no history of restless legs.  She denies sleep hallucinations, sleep paralysis, or cataplexy.  The Epworth score is 7 out of 24.   Physical Exam:  General - pleasant Eyes - pupils reactive, wears glasses ENT - no sinus tenderness, no oral exudate, no LAN, MP 4, enlarged tongue Cardiac - regular, no murmur Chest - no wheeze, rales Abd - soft, non tender Ext - no edema Skin - no rashes Neuro - normal strength Psych - normal mood  Discussion: She has snoring sleep disruption, apnea, and daytime sleepiness.  She has upper airway anatomy that is suggestive of risk for sleep apnea.  She has history of depression.  I discussed the spectrum of sleep disordered breathing, and explained that she could have more than just snoring and might have sleep apnea.  We discussed how sleep apnea can affect various health problems, including risks for hypertension, cardiovascular disease, and  diabetes.  We also discussed how sleep disruption can increase risks for accidents, such as while driving.  Weight loss as a means of improving sleep apnea was also reviewed.  Additional treatment options discussed were CPAP therapy, oral appliance, and surgical intervention.  Assessment/Plan:  Suspected sleep apnea. - will arrange for home sleep study pending insurance approval   Patient Instructions  Will arrange for home sleep study Will call to arrange for follow up after sleep study reviewed    Chesley Mires, MD [place] 11/25/2017, 12:36 PM Pager:  [phone]  Flow Sheet  Sleep tests:  Review of Systems: Constitutional: Negative for fever and unexpected weight change.  HENT: Negative for congestion, dental problem, ear pain, nosebleeds, postnasal drip, rhinorrhea, sinus pressure, sneezing, sore throat and trouble swallowing.   Eyes: Negative for redness and itching.  Respiratory: Negative for cough, chest tightness, shortness of breath and wheezing.   Cardiovascular: Negative for palpitations and leg swelling.  Gastrointestinal: Negative for nausea and vomiting.  Genitourinary: Negative for dysuria.  Musculoskeletal: Negative for joint swelling.  Skin: Negative for rash.  Allergic/Immunologic: Positive for environmental allergies. Negative for food allergies and immunocompromised state.  Neurological: Negative for headaches.  Hematological: Does not bruise/bleed easily.  Psychiatric/Behavioral: Negative for dysphoric mood. The patient is not nervous/anxious.    Past Medical History: She  has a past  medical history of ALLERGIC RHINITIS, Arthritis, DEPRESSION, Diabetes mellitus without complication ([place]) (4982), Fibroid, uterine (04/29/2016), HYPERLIPIDEMIA, Personal history of colonic polyps, SMOKER, and VITAMIN D DEFICIENCY.  Past Surgical History: She  has a past surgical history that includes Right colectomy (07/30/06); Breast surgery (1990);  Endometrial ablation (08/21/03); Colonoscopy w/ biopsies (9/07); Upper gastrointestinal endoscopy (06/15/06); Colonoscopy w/ biopsies (11/13/08); Abdominal hysterectomy (N/A, 10/02/2016); Salpingoophorectomy (Bilateral, 10/02/2016); Abdominal sacrocolpopexy (N/A, 10/02/2016); Anterior and posterior repair (N/A, 10/02/2016); Bladder suspension (N/A, 10/02/2016); and Cystoscopy (N/A, 10/02/2016).  Family History: Her family history includes Breast cancer (age of onset: 25) in her mother; Breast cancer (age of onset: 29) in her sister; COPD in her mother; Diabetes in her mother; Heart disease in her father and sister; Heart failure in her father and mother; Hyperlipidemia in her father and mother; Hypertension in her father and mother; Psoriasis in her son; Stroke in her mother.  Social History: She  reports that she has been smoking cigarettes.  She has a 9.00 pack-year smoking history. she has never used smokeless tobacco. She reports that she drinks alcohol. She reports that she does not use drugs.  Medications: Allergies as of 11/25/2017   No Known Allergies     Medication List        Accurate as of 11/25/17 12:36 PM. Always use your most recent med list.          azelastine 0.1 % nasal spray Commonly known as:  ASTELIN USE ONE SPRAY IN EACH NOSTRIL DAILY AS NEEDED   azelastine 0.1 % nasal spray Commonly known as:  ASTELIN USE ONE SPRAY IN EACH NOSTRIL DAILY AS NEEDED   cetirizine 10 MG tablet Commonly known as:  ZYRTEC Take 10 mg by mouth daily as needed for allergies.   escitalopram 10 MG tablet Commonly known as:  LEXAPRO TAKE 1 TABLET (10 MG TOTAL) BY MOUTH DAILY.   hydrocortisone 2.5 % rectal cream Commonly known as:  ANUSOL-HC Place 1 application rectally 2 (two) times daily.   hydrocortisone 25 MG suppository Commonly known as:  ANUSOL-HC Place 1 suppository (25 mg total) rectally 2 (two) times daily.   nystatin cream Commonly known as:  MYCOSTATIN Apply 1  application topically 2 (two) times daily. Apply to affected area BID   simvastatin 20 MG tablet Commonly known as:  ZOCOR TAKE 1 TABLET (20 MG TOTAL) BY MOUTH DAILY AT 6 PM.   triamcinolone 0.025 % ointment Commonly known as:  KENALOG Apply 1 application topically 2 (two) times daily.   Vitamin D (Ergocalciferol) 50000 units Caps capsule Commonly known as:  DRISDOL Take 1 capsule (50,000 Units total) by mouth once a week.

## 2017-11-25 NOTE — Progress Notes (Signed)
   Subjective:    Patient ID: Christina Buchanan, female    DOB: May 28, 1956, 61 y.o.   MRN: [id number]  HPI    Review of Systems  Constitutional: Negative for fever and unexpected weight change.  HENT: Negative for congestion, dental problem, ear pain, nosebleeds, postnasal drip, rhinorrhea, sinus pressure, sneezing, sore throat and trouble swallowing.   Eyes: Negative for redness and itching.  Respiratory: Negative for cough, chest tightness, shortness of breath and wheezing.   Cardiovascular: Negative for palpitations and leg swelling.  Gastrointestinal: Negative for nausea and vomiting.  Genitourinary: Negative for dysuria.  Musculoskeletal: Negative for joint swelling.  Skin: Negative for rash.  Allergic/Immunologic: Positive for environmental allergies. Negative for food allergies and immunocompromised state.  Neurological: Negative for headaches.  Hematological: Does not bruise/bleed easily.  Psychiatric/Behavioral: Negative for dysphoric mood. The patient is not nervous/anxious.        Objective:   Physical Exam        Assessment & Plan:

## 2017-12-09 ENCOUNTER — Other Ambulatory Visit: Payer: Self-pay | Admitting: *Deleted

## 2017-12-09 DIAGNOSIS — R29818 Other symptoms and signs involving the nervous system: Secondary | ICD-10-CM

## 2017-12-09 DIAGNOSIS — G4733 Obstructive sleep apnea (adult) (pediatric): Secondary | ICD-10-CM | POA: Diagnosis not present

## 2017-12-15 ENCOUNTER — Telehealth: Payer: Self-pay | Admitting: Pulmonary Disease

## 2017-12-15 ENCOUNTER — Encounter: Payer: Self-pay | Admitting: Pulmonary Disease

## 2017-12-15 DIAGNOSIS — G4733 Obstructive sleep apnea (adult) (pediatric): Secondary | ICD-10-CM | POA: Diagnosis not present

## 2017-12-15 HISTORY — DX: Obstructive sleep apnea (adult) (pediatric): G47.33

## 2017-12-15 NOTE — Telephone Encounter (Signed)
HST 12/09/17 >> AHI 60.3, SaO2 80%   Will have my nurse inform pt that sleep study shows very severe sleep apnea.  Options are 1) CPAP now, 2) ROV first.  If She is agreeable to CPAP, then please send order for auto CPAP range 5 to 15 cm H2O with heated humidity and mask of choice.  Have download sent 1 month after starting CPAP and set up ROV 2 months after starting CPAP.  ROV can be with me or NP.

## 2017-12-16 NOTE — Telephone Encounter (Signed)
Called and spoke with patient today regarding results and recommendations.  Informed the patient of results today.  patient verbalized understanding and is not wanting CPAP machine  Pt is wanting to come in to discuss sleep study results with VS Scheduled appt for 02-02-18 at 9:30am with VS

## 2018-01-01 ENCOUNTER — Other Ambulatory Visit: Payer: Self-pay | Admitting: Internal Medicine

## 2018-02-02 ENCOUNTER — Ambulatory Visit: Payer: BLUE CROSS/BLUE SHIELD | Admitting: Pulmonary Disease

## 2018-02-13 ENCOUNTER — Other Ambulatory Visit: Payer: Self-pay | Admitting: Obstetrics & Gynecology

## 2018-02-15 NOTE — Telephone Encounter (Signed)
Medication refill request: lexapro 10mg  #90 Last AEX:  04-01-17 Next AEX: 04-02-18 Last MMG (if hormonal medication request): 06-29-17 HTV:[id number] Refill authorized: please advise

## 2018-03-06 ENCOUNTER — Encounter: Payer: Self-pay | Admitting: Internal Medicine

## 2018-03-08 DIAGNOSIS — L720 Epidermal cyst: Secondary | ICD-10-CM | POA: Diagnosis not present

## 2018-03-09 ENCOUNTER — Ambulatory Visit: Payer: BLUE CROSS/BLUE SHIELD | Admitting: Internal Medicine

## 2018-03-12 ENCOUNTER — Encounter: Payer: Self-pay | Admitting: Pulmonary Disease

## 2018-03-12 ENCOUNTER — Ambulatory Visit: Payer: 59 | Admitting: Pulmonary Disease

## 2018-03-12 VITALS — BP 118/78 | HR 72 | Ht 63.0 in | Wt 154.2 lb

## 2018-03-12 DIAGNOSIS — G4733 Obstructive sleep apnea (adult) (pediatric): Secondary | ICD-10-CM

## 2018-03-12 NOTE — Patient Instructions (Signed)
Will arrange for CPAP set up  Follow up in 2 months after CPAP set up 

## 2018-03-12 NOTE — Progress Notes (Signed)
[facility]  Chief Complaint  Patient presents with  . Follow-up    Pt is here for sleep study results.    Vital signs: BP 118/78 (BP Location: Left Arm, Cuff Size: Normal)   Pulse 72   Ht 5\' 3"  (1.6 m)   Wt 154 lb 3.2 oz (69.9 kg)   LMP 03/06/2006 (Approximate)   SpO2 96%   BMI 27.32 kg/m   History of Present Illness: Christina Buchanan is a 62 y.o. female with obstructive sleep apnea.  She had home sleep study.  Showed severe sleep apnea.  Here to review therapy options.  Physical Exam:  General - pleasant Eyes - pupils reactive, wears glasses ENT - no sinus tenderness, no oral exudate, no LAN, MP 4, enlarged tongue, slight over bite Cardiac - regular, no murmur Chest - no wheeze, rales Abd - soft, non tender Ext - no edema Skin - no rashes Neuro - normal strength Psych - normal mood  Assessment/Plan:  Obstructive sleep apnea. - We discussed how sleep apnea can affect various health problems, including risks for hypertension, cardiovascular disease, and diabetes.  We also discussed how sleep disruption can increase risks for accidents, such as while driving.  Weight loss as a means of improving sleep apnea was also reviewed.  Additional treatment options discussed were CPAP therapy, oral appliance, and surgical intervention. - will arrange for auto CPAP set up   Patient Instructions  Will arrange for CPAP set up  Follow up in 2 months after CPAP set up   Chesley Mires, MD [place] 03/12/2018, 3:11 PM Pager:  [phone]  Flow Sheet  Sleep tests: HST 12/09/17 >> AHI 60.3, SaO2 80%  Past Medical History: She  has a past medical history of ALLERGIC RHINITIS, Arthritis, DEPRESSION, Diabetes mellitus without complication ([place]) (1829), Fibroid, uterine (04/29/2016), HYPERLIPIDEMIA, OSA (obstructive sleep apnea) (12/15/2017), Personal history of colonic polyps, SMOKER, and VITAMIN D DEFICIENCY.  Past  Surgical History: She  has a past surgical history that includes Right colectomy (07/30/06); Breast surgery (1990); Endometrial ablation (08/21/03); Colonoscopy w/ biopsies (9/07); Upper gastrointestinal endoscopy (06/15/06); Colonoscopy w/ biopsies (11/13/08); Abdominal hysterectomy (N/A, 10/02/2016); Salpingoophorectomy (Bilateral, 10/02/2016); Abdominal sacrocolpopexy (N/A, 10/02/2016); Anterior and posterior repair (N/A, 10/02/2016); Bladder suspension (N/A, 10/02/2016); and Cystoscopy (N/A, 10/02/2016).  Family History: Her family history includes Breast cancer (age of onset: 64) in her mother; Breast cancer (age of onset: 50) in her sister; COPD in her mother; Diabetes in her mother; Heart disease in her father and sister; Heart failure in her father and mother; Hyperlipidemia in her father and mother; Hypertension in her father and mother; Psoriasis in her son; Stroke in her mother.  Social History: She  reports that she has been smoking cigarettes.  She has a 9.00 pack-year smoking history. She has never used smokeless tobacco. She reports that she drinks alcohol. She reports that she does not use drugs.  Medications: Allergies as of 03/12/2018   No Known Allergies     Medication List        Accurate as of 03/12/18  3:11 PM. Always use your most recent med list.          azelastine 0.1 % nasal spray Commonly known as:  ASTELIN USE ONE SPRAY IN EACH NOSTRIL DAILY AS NEEDED   azelastine 0.1 % nasal spray Commonly known as:  ASTELIN USE ONE SPRAY IN EACH NOSTRIL DAILY AS NEEDED   cetirizine 10 MG tablet Commonly known as:  ZYRTEC  Take 10 mg by mouth daily as needed for allergies.   escitalopram 10 MG tablet Commonly known as:  LEXAPRO TAKE 1 TABLET BY MOUTH EVERY DAY   hydrocortisone 2.5 % rectal cream Commonly known as:  ANUSOL-HC Place 1 application rectally 2 (two) times daily.   hydrocortisone 25 MG suppository Commonly known as:  ANUSOL-HC Place 1 suppository (25 mg  total) rectally 2 (two) times daily.   nystatin cream Commonly known as:  MYCOSTATIN Apply 1 application topically 2 (two) times daily. Apply to affected area BID   simvastatin 20 MG tablet Commonly known as:  ZOCOR TAKE 1 TABLET (20 MG TOTAL) BY MOUTH DAILY AT 6 PM.   triamcinolone 0.025 % ointment Commonly known as:  KENALOG Apply 1 application topically 2 (two) times daily.   Vitamin D (Ergocalciferol) 50000 units Caps capsule Commonly known as:  DRISDOL Take 1 capsule (50,000 Units total) by mouth once a week.

## 2018-03-22 ENCOUNTER — Ambulatory Visit: Payer: BLUE CROSS/BLUE SHIELD | Admitting: Internal Medicine

## 2018-04-01 NOTE — Progress Notes (Signed)
62 y.o. G85P3003 Married Caucasian female here for annual exam.    Will see her PCP next week.  Having stomach problems.  Having diarrhea all the time per patient.  Difficulty getting far away from restroom.  Worse over the last year per patient.  Wants testing to understand this.   Doing a fasting diet.  Has lost 7 pounds.  Wants to loose 10 more pounds.   PCP:   Dr. Quay Burow  Patient's last menstrual period was 03/06/2006 (approximate).           Sexually active: Yes.    The current method of family planning is Hysterectomy, husband has a vasectomy   Exercising: No.  The patient does not participate in regular exercise at present. Smoker:  yes  Health Maintenance: Pap:  02/17/2016 Negative7/18/16 Negative with neg HRHPV History of abnormal Pap:  yes MMG:  06/29/2017 Birads 1 Negative Colonoscopy:  09/24/2015, Tubular Adenoma, repeat in 5 years BMD:  10/20/06 Z score: +0.4 Spine/ -0.4 Left Femur Neck TDaP:  02/17/2012 HIV: 04/29/2016 Hep C: 10/04/2015   reports that she has been smoking cigarettes.  She has a 9.00 pack-year smoking history. She has never used smokeless tobacco. She reports that she drinks about 1.2 oz of alcohol per week. She reports that she does not use drugs.  Past Medical History:  Diagnosis Date  . ALLERGIC RHINITIS   . Arthritis    hands  . DEPRESSION   . Diabetes mellitus without complication ([place]) 2683   diet controlled  . Fibroid, uterine 04/29/2016  . HYPERLIPIDEMIA   . OSA (obstructive sleep apnea) 12/15/2017  . Personal history of colonic polyps   . SMOKER   . VITAMIN D DEFICIENCY     Past Surgical History:  Procedure Laterality Date  . ABDOMINAL HYSTERECTOMY N/A 10/02/2016   Procedure: HYSTERECTOMY ABDOMINAL;  Surgeon: Nunzio Cobbs, MD;  Location: [facility];  Service: Gynecology;  Laterality: N/A;  4 hours  . ABDOMINAL SACROCOLPOPEXY N/A 10/02/2016   Procedure: ABDOMINO SACROCOLPOPEXY;  Surgeon: Nunzio Cobbs, MD;   Location: [facility];  Service: Gynecology;  Laterality: N/A;  . ANTERIOR AND POSTERIOR REPAIR N/A 10/02/2016   Procedure: ANTERIOR (CYSTOCELE) AND POSTERIOR REPAIR (RECTOCELE);  Surgeon: Nunzio Cobbs, MD;  Location: [facility];  Service: Gynecology;  Laterality: N/A;  . BLADDER SUSPENSION N/A 10/02/2016   Procedure: TRANSVAGINAL TAPE (TVT) PROCEDURE exact midurethral sling;  Surgeon: Nunzio Cobbs, MD;  Location: [facility];  Service: Gynecology;  Laterality: N/A;  . BREAST SURGERY  1990   breast biopsy  . COLONOSCOPY W/ BIOPSIES  9/07   2 polyps & tubuvillous adenoma  . COLONOSCOPY W/ BIOPSIES  11/13/08   sigmoid ddiverticuli and rechek in 5 years - Dr. Collene Mares  . CYSTOSCOPY N/A 10/02/2016   Procedure: CYSTOSCOPY;  Surgeon: Nunzio Cobbs, MD;  Location: [facility];  Service: Gynecology;  Laterality: N/A;  . ENDOMETRIAL ABLATION  08/21/03   HTA  . 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 SALPINGOOPHORECTOMY Bilateral 10/02/2016   Procedure: SALPINGO OOPHORECTOMY;  Surgeon: Nunzio Cobbs, MD;  Location: [facility];  Service: Gynecology;  Laterality: Bilateral;  . UPPER GASTROINTESTINAL ENDOSCOPY  06/15/06   2 polyps - H pylori    Current Outpatient Medications  Medication Sig Dispense Refill  . azelastine (ASTELIN) 0.1 % nasal spray USE ONE SPRAY IN EACH NOSTRIL DAILY AS  NEEDED 30 mL 3  . cetirizine (ZYRTEC) 10 MG tablet Take 10 mg by mouth daily as needed for allergies.     [name] escitalopram (LEXAPRO) 10 MG tablet TAKE 1 TABLET BY MOUTH EVERY DAY 90 tablet 0  . hydrocortisone (ANUSOL-HC) 2.5 % rectal cream Place 1 application rectally 2 (two) times daily. 30 g 5  . hydrocortisone (ANUSOL-HC) 25 MG suppository Place 1 suppository (25 mg total) rectally 2 (two) times daily. 28 suppository 8  . nystatin cream (MYCOSTATIN) Apply 1 application topically 2 (two) times daily. Apply to affected area BID 30 g 1  .  simvastatin (ZOCOR) 20 MG tablet TAKE 1 TABLET (20 MG TOTAL) BY MOUTH DAILY AT 6 PM. 90 tablet 0  . triamcinolone (KENALOG) 0.025 % ointment Apply 1 application topically 2 (two) times daily. 30 g 1  . Vitamin D, Ergocalciferol, (DRISDOL) 50000 units CAPS capsule Take 1 capsule (50,000 Units total) by mouth once a week. 30 capsule 1   No current facility-administered medications for this visit.     Family History  Problem Relation Age of Onset  . Breast cancer Mother 83  . Diabetes Mother   . Stroke Mother               . Hyperlipidemia Mother   . Heart failure Mother   . Hypertension Mother   . COPD Mother   . Hypertension Father   . Heart disease Father   . Hyperlipidemia Father   . Heart failure Father        dec at age 67  . Breast cancer Sister 52  . Heart disease Sister        Afib  . Psoriasis Son     Review of Systems  Constitutional: Negative.   HENT: Negative.   Eyes: Negative.   Respiratory: Negative.   Cardiovascular: Negative.   Gastrointestinal: Negative.   Endocrine: Negative.   Genitourinary: Negative.   Musculoskeletal: Negative.   Skin: Negative.   Allergic/Immunologic: Negative.   Neurological: Negative.   Hematological: Negative.   Psychiatric/Behavioral: Negative.     Exam:   BP 124/79 (BP Location: Right Arm)   Pulse 72   Ht 5\' 3"  (1.6 m)   Wt 152 lb (68.9 kg)   LMP 03/06/2006 (Approximate)   BMI 26.93 kg/m     General appearance: alert, cooperative and appears stated age Head: Normocephalic, without obvious abnormality, atraumatic Neck: no adenopathy, supple, symmetrical, trachea midline and thyroid normal to inspection and palpation Lungs: clear to auscultation bilaterally Breasts: normal appearance, no masses or tenderness, No nipple retraction or dimpling, No nipple discharge or bleeding, No axillary or supraclavicular adenopathy Heart: regular rate and rhythm Abdomen: soft, non-tender; no masses, no organomegaly Extremities:  extremities normal, atraumatic, no cyanosis or edema Skin: Skin color, texture, turgor normal. No rashes or lesions Lymph nodes: Cervical, supraclavicular, and axillary nodes normal. No abnormal inguinal nodes palpated Neurologic: Grossly normal  Pelvic: External genitalia:  no lesions              Urethra:  normal appearing urethra with no masses, tenderness or lesions              Bartholins and Skenes: normal                 Vagina: normal appearing vagina with normal color and discharge, no lesions              Cervix:  absent  Pap taken: No. Bimanual Exam:  Uterus:  absent              Adnexa: no mass, fullness, tenderness              Rectal exam: Yes.  .  Confirms.              Anus:  normal sphincter tone, no lesions  Chaperone was present for exam.  Assessment:   Well woman visit with normal exam. S/P TAH with BSO, abdominal sacral colpopexy, AP colporrhaphy, TVT mid urethral sling and cysto 10/02/16 FH breast cancer mother and sister.  DM.  Controlling with diet and exercise.  Smoker.  Vit D deficiency.  Chronic diarrhea.  Plan: Mammogram screening. Recommended self breast awareness. Pap and HR HPV as above. Guidelines for Calcium, Vitamin D, regular exercise program including cardiovascular and weight bearing exercise. Check vit D level.  Other labs through PCP.  BMD this year.  Referral for genetic counseling and testing.  She will see her PCP for preliminary eval of diarrhea.  Follow up annually and prn.   After visit summary provided.

## 2018-04-02 ENCOUNTER — Encounter: Payer: Self-pay | Admitting: Obstetrics and Gynecology

## 2018-04-02 ENCOUNTER — Other Ambulatory Visit: Payer: Self-pay

## 2018-04-02 ENCOUNTER — Ambulatory Visit: Payer: 59 | Admitting: Obstetrics and Gynecology

## 2018-04-02 VITALS — BP 124/79 | HR 72 | Ht 63.0 in | Wt 152.0 lb

## 2018-04-02 DIAGNOSIS — Z803 Family history of malignant neoplasm of breast: Secondary | ICD-10-CM

## 2018-04-02 DIAGNOSIS — Z01419 Encounter for gynecological examination (general) (routine) without abnormal findings: Secondary | ICD-10-CM | POA: Diagnosis not present

## 2018-04-02 DIAGNOSIS — Z780 Asymptomatic menopausal state: Secondary | ICD-10-CM | POA: Diagnosis not present

## 2018-04-02 DIAGNOSIS — Z8639 Personal history of other endocrine, nutritional and metabolic disease: Secondary | ICD-10-CM

## 2018-04-02 NOTE — Patient Instructions (Signed)

## 2018-04-03 LAB — VITAMIN D 25 HYDROXY (VIT D DEFICIENCY, FRACTURES): Vit D, 25-Hydroxy: 25 ng/mL — ABNORMAL LOW (ref 30.0–100.0)

## 2018-04-04 NOTE — Progress Notes (Signed)
Subjective:    Patient ID: Christina Buchanan, female    DOB: 03/30/1956, 62 y.o.   MRN: [id number]  HPI The patient is here for follow up.  Stomach symptoms/ diarrhea:  She eats and then one hour later especially at night it goes right through her.  She has urgency with it and sometimes has not made it.  It is diarrhea.  She will take an imodium and it stops for one day, but come back the next day.  She has always had stomach issues, but it is much worse.  She denies abdominal cramping or pain.  It is not related a certain food or stress.  She tried eliminating certain foods.  She has tried lactose probiotics, fiber and stopped artificial sweeteners.  Most of her family members have it.    Diabetes: She is not taking her medication  - the metformin made her GI symptoms worse and she stopped it. She is compliant with a diabetic diet. She is walking a lot daily- 10,000 steps a day.  She checks her feet daily and denies foot lesions. She is up-to-date with an ophthalmology examination.   Hyperlipidemia: She is taking her medication daily. She is compliant with a low fat/cholesterol diet. She is very active and walks a lot daily. She denies myalgias.   Depression: She is taking her medication daily as prescribed. She denies any side effects from the medication. She feels her depression is well controlled and she is happy with her current dose of medication.     Medications and allergies reviewed with patient and updated if appropriate.  Patient Active Problem List   Diagnosis Date Noted  . OSA (obstructive sleep apnea) 12/15/2017  . Wears hearing aid in both ears 09/08/2017  . Hemorrhoids 09/08/2017  . Status post total abdominal hysterectomy and bilateral salpingo-oophorectomy 10/02/2016  . Diabetes ([place]) 08/12/2016  . Pure hypercholesterolemia 04/29/2016  . PMB (postmenopausal bleeding) 04/29/2016  . Hematuria 04/29/2016  . Estrogen deficiency 04/29/2016  . Vitamin D deficiency  01/29/2010  . SMOKER 01/21/2010  . Depression 01/21/2010  . ALLERGIC RHINITIS 01/21/2010  . Personal history of colonic polyps 07/30/2006    Current Outpatient Medications on File Prior to Visit  Medication Sig Dispense Refill  . APPLE CIDER VINEGAR PO Take by mouth.    [name] azelastine (ASTELIN) 0.1 % nasal spray USE ONE SPRAY IN EACH NOSTRIL DAILY AS NEEDED 30 mL 3  . cetirizine (ZYRTEC) 10 MG tablet Take 10 mg by mouth daily as needed for allergies.     Verneita Griffes Bark POWD 500 mg by Does not apply route.    [name] escitalopram (LEXAPRO) 10 MG tablet TAKE 1 TABLET BY MOUTH EVERY DAY 90 tablet 0  . hydrocortisone (ANUSOL-HC) 2.5 % rectal cream Place 1 application rectally 2 (two) times daily. 30 g 5  . hydrocortisone (ANUSOL-HC) 25 MG suppository Place 1 suppository (25 mg total) rectally 2 (two) times daily. 28 suppository 8  . nystatin cream (MYCOSTATIN) Apply 1 application topically 2 (two) times daily. Apply to affected area BID 30 g 1  . Omega-3 Fatty Acids (OMEGA 3 PO) Take 2.5 g by mouth.    . simvastatin (ZOCOR) 20 MG tablet TAKE 1 TABLET (20 MG TOTAL) BY MOUTH DAILY AT 6 PM. 90 tablet 0  . triamcinolone (KENALOG) 0.025 % ointment Apply 1 application topically 2 (two) times daily. 30 g 1  . Vitamin D, Ergocalciferol, (DRISDOL) 50000 units CAPS capsule Take 1 capsule (50,000 Units total)  by mouth once a week. 30 capsule 1   No current facility-administered medications on file prior to visit.     Past Medical History:  Diagnosis Date  . ALLERGIC RHINITIS   . Arthritis    hands  . DEPRESSION   . Diabetes mellitus without complication ([place]) 3664   diet controlled  . Fibroid, uterine 04/29/2016  . HYPERLIPIDEMIA   . OSA (obstructive sleep apnea) 12/15/2017  . Personal history of colonic polyps   . SMOKER   . VITAMIN D DEFICIENCY     Past Surgical History:  Procedure Laterality Date  . ABDOMINAL HYSTERECTOMY N/A 10/02/2016   Procedure: HYSTERECTOMY ABDOMINAL;  Surgeon: Nunzio Cobbs, MD;  Location: [facility];  Service: Gynecology;  Laterality: N/A;  4 hours  . ABDOMINAL SACROCOLPOPEXY N/A 10/02/2016   Procedure: ABDOMINO SACROCOLPOPEXY;  Surgeon: Nunzio Cobbs, MD;  Location: [facility];  Service: Gynecology;  Laterality: N/A;  . ANTERIOR AND POSTERIOR REPAIR N/A 10/02/2016   Procedure: ANTERIOR (CYSTOCELE) AND POSTERIOR REPAIR (RECTOCELE);  Surgeon: Nunzio Cobbs, MD;  Location: [facility];  Service: Gynecology;  Laterality: N/A;  . BLADDER SUSPENSION N/A 10/02/2016   Procedure: TRANSVAGINAL TAPE (TVT) PROCEDURE exact midurethral sling;  Surgeon: Nunzio Cobbs, MD;  Location: [facility];  Service: Gynecology;  Laterality: N/A;  . BREAST SURGERY  1990   breast biopsy  . COLONOSCOPY W/ BIOPSIES  9/07   2 polyps & tubuvillous adenoma  . COLONOSCOPY W/ BIOPSIES  11/13/08   sigmoid ddiverticuli and rechek in 5 years - Dr. Collene Mares  . CYSTOSCOPY N/A 10/02/2016   Procedure: CYSTOSCOPY;  Surgeon: Nunzio Cobbs, MD;  Location: [facility];  Service: Gynecology;  Laterality: N/A;  . ENDOMETRIAL ABLATION  08/21/03   HTA  . 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 SALPINGOOPHORECTOMY Bilateral 10/02/2016   Procedure: SALPINGO OOPHORECTOMY;  Surgeon: Nunzio Cobbs, MD;  Location: [facility];  Service: Gynecology;  Laterality: Bilateral;  . UPPER GASTROINTESTINAL ENDOSCOPY  06/15/06   2 polyps - H pylori    Social History   Socioeconomic History  . Marital status: Married    Spouse name: Not on file  . Number of children: Not on file  . Years of education: Not on file  . Highest education level: Not on file  Occupational History  . Not on file  Social Needs  . Financial resource strain: Not on file  . Food insecurity:    Worry: Not on file    Inability: Not on file  . Transportation needs:    Medical: Not on file    Non-medical: Not on file  Tobacco Use  . Smoking  status: Current Every Day Smoker    Packs/day: 0.30    Years: 30.00    Pack years: 9.00    Types: Cigarettes  . Smokeless tobacco: Never Used  . Tobacco comment: Married, lives with spouse. Cares for elderly parent and in-laws  Substance and Sexual Activity  . Alcohol use: Yes    Alcohol/week: 1.2 oz    Types: 2 Standard drinks or equivalent per week    Comment: Rare glass of wine  . Drug use: No  . Sexual activity: Yes    Partners: Male    Birth control/protection: Other-see comments, Post-menopausal    Comment: husband had vasectomy  Lifestyle  . Physical activity:    Days per  week: Not on file    Minutes per session: Not on file  . Stress: Not on file  Relationships  . Social connections:    Talks on phone: Not on file    Gets together: Not on file    Attends religious service: Not on file    Active member of club or organization: Not on file    Attends meetings of clubs or organizations: Not on file    Relationship status: Not on file  Other Topics Concern  . Not on file  Social History Narrative   Lives with husband in a one story home.  She has 3 grown children.     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     Education: high school.        No regular exercise    Family History  Problem Relation Age of Onset  . Breast cancer Mother 64  . Diabetes Mother   . Stroke Mother               . Hyperlipidemia Mother   . Heart failure Mother   . Hypertension Mother   . COPD Mother   . Hypertension Father   . Heart disease Father   . Hyperlipidemia Father   . Heart failure Father        dec at age 40  . Breast cancer Sister 75  . Heart disease Sister        Afib  . Psoriasis Son     Review of Systems  Constitutional: Negative for chills and fever.  Respiratory: Negative for cough, shortness of breath and wheezing.   Cardiovascular: Negative for chest pain and palpitations.  Gastrointestinal: Positive for diarrhea. Negative for abdominal pain, blood in stool,  constipation and nausea.       Rare gerd  Neurological: Negative for light-headedness and headaches.       Objective:   Vitals:   04/05/18 1409  BP: 120/68  Pulse: 85  Resp: 16  Temp: 98.5 F (36.9 C)  SpO2: 95%   BP Readings from Last 3 Encounters:  04/05/18 120/68  04/02/18 124/79  03/12/18 118/78   Wt Readings from Last 3 Encounters:  04/05/18 152 lb (68.9 kg)  04/02/18 152 lb (68.9 kg)  03/12/18 154 lb 3.2 oz (69.9 kg)   Body mass index is 26.93 kg/m.   Physical Exam    Constitutional: Appears well-developed and well-nourished. No distress.  HENT:  Head: Normocephalic and atraumatic.  Neck: Neck supple. No tracheal deviation present. No thyromegaly present.  No cervical lymphadenopathy Cardiovascular: Normal rate, regular rhythm and normal heart sounds.   No murmur heard. No carotid bruit .  No edema Pulmonary/Chest: Effort normal and breath sounds normal. No respiratory distress. No has no wheezes. No rales.  Abdomen; soft, NT, ND Skin: Skin is warm and dry. Not diaphoretic.  Psychiatric: Normal mood and affect. Behavior is normal.      Assessment & Plan:    See Problem List for Assessment and Plan of chronic medical problems.

## 2018-04-05 ENCOUNTER — Other Ambulatory Visit (INDEPENDENT_AMBULATORY_CARE_PROVIDER_SITE_OTHER): Payer: 59

## 2018-04-05 ENCOUNTER — Encounter: Payer: Self-pay | Admitting: Internal Medicine

## 2018-04-05 ENCOUNTER — Ambulatory Visit: Payer: 59 | Admitting: Internal Medicine

## 2018-04-05 VITALS — BP 120/68 | HR 85 | Temp 98.5°F | Resp 16 | Wt 152.0 lb

## 2018-04-05 DIAGNOSIS — E7800 Pure hypercholesterolemia, unspecified: Secondary | ICD-10-CM

## 2018-04-05 DIAGNOSIS — R197 Diarrhea, unspecified: Secondary | ICD-10-CM

## 2018-04-05 DIAGNOSIS — E119 Type 2 diabetes mellitus without complications: Secondary | ICD-10-CM

## 2018-04-05 DIAGNOSIS — K589 Irritable bowel syndrome without diarrhea: Secondary | ICD-10-CM | POA: Insufficient documentation

## 2018-04-05 DIAGNOSIS — F3289 Other specified depressive episodes: Secondary | ICD-10-CM

## 2018-04-05 LAB — COMPREHENSIVE METABOLIC PANEL
ALT: 27 U/L (ref 0–35)
AST: 24 U/L (ref 0–37)
Albumin: 4.4 g/dL (ref 3.5–5.2)
Alkaline Phosphatase: 65 U/L (ref 39–117)
BUN: 17 mg/dL (ref 6–23)
CO2: 27 mEq/L (ref 19–32)
Calcium: 9.4 mg/dL (ref 8.4–10.5)
Chloride: 105 mEq/L (ref 96–112)
Creatinine, Ser: 0.6 mg/dL (ref 0.40–1.20)
GFR: 107.25 mL/min (ref >60.00)
Glucose, Bld: 153 mg/dL — ABNORMAL HIGH (ref 70–99)
Potassium: 3.8 mEq/L (ref 3.5–5.1)
Sodium: 140 mEq/L (ref 135–145)
Total Bilirubin: 0.5 mg/dL (ref 0.2–1.2)
Total Protein: 7.2 g/dL (ref 6.0–8.3)

## 2018-04-05 LAB — MICROALBUMIN / CREATININE URINE RATIO
Creatinine,U: 214.4 mg/dL
Microalb Creat Ratio: 1.9 mg/g (ref 0.0–30.0)
Microalb, Ur: 4.1 mg/dL — ABNORMAL HIGH (ref 0.0–1.9)

## 2018-04-05 LAB — TSH: TSH: 1.53 u[IU]/mL (ref 0.35–4.50)

## 2018-04-05 LAB — LIPID PANEL
Cholesterol: 144 mg/dL (ref 0–200)
HDL: 41.2 mg/dL (ref >39.00)
LDL Cholesterol: 73 mg/dL (ref 0–99)
NonHDL: 102.55
Total CHOL/HDL Ratio: 3
Triglycerides: 150 mg/dL — ABNORMAL HIGH (ref 0.0–149.0)
VLDL: 30 mg/dL (ref 0.0–40.0)

## 2018-04-05 LAB — HEMOGLOBIN A1C: Hgb A1c MFr Bld: 7.2 % — ABNORMAL HIGH (ref 4.6–6.5)

## 2018-04-05 NOTE — Patient Instructions (Addendum)
  Test(s) ordered today. Your results will be released to [facility] (or called to you) after review, usually within 72hours after test completion. If any changes need to be made, you will be notified at that same time.   Medications reviewed and updated.  No changes recommended at this time.   A referral was ordered for GI    Please followup in 6 months

## 2018-04-05 NOTE — Assessment & Plan Note (Signed)
Sounds like IBS - no obvious cause Family has similar symptoms Will r/o celiac disease checkc cmp, tsh Try lactose pills 30 min prior to meals/snacks Will refer to GI - Dr Collene Mares

## 2018-04-05 NOTE — Assessment & Plan Note (Signed)
Diet controlled - would like to avoid meds if possible Walks a lot daily Compliant with a diabetic diet Check a1c, urine micro

## 2018-04-05 NOTE — Assessment & Plan Note (Signed)
Controlled, stable Continue current dose of medication  

## 2018-04-05 NOTE — Assessment & Plan Note (Signed)
Check lipid panel,cmp ,tsh Continue daily statin Regular exercise and healthy diet encouraged  

## 2018-04-06 ENCOUNTER — Encounter: Payer: Self-pay | Admitting: Genetics

## 2018-04-06 DIAGNOSIS — G4733 Obstructive sleep apnea (adult) (pediatric): Secondary | ICD-10-CM | POA: Diagnosis not present

## 2018-04-07 ENCOUNTER — Other Ambulatory Visit: Payer: Self-pay | Admitting: Obstetrics and Gynecology

## 2018-04-07 ENCOUNTER — Ambulatory Visit: Payer: BLUE CROSS/BLUE SHIELD | Admitting: Nurse Practitioner

## 2018-04-07 ENCOUNTER — Telehealth: Payer: Self-pay | Admitting: Obstetrics and Gynecology

## 2018-04-07 DIAGNOSIS — Z780 Asymptomatic menopausal state: Secondary | ICD-10-CM

## 2018-04-07 DIAGNOSIS — E2839 Other primary ovarian failure: Secondary | ICD-10-CM

## 2018-04-07 NOTE — Telephone Encounter (Signed)
Tonya from the [facility] called regarding an order for a bone density test. The patient is scheduled on Friday, 04/09/18. She said she is faxing an order to Dr. Quincy Simmonds for a different diagnosis code for insurance coverage: estrogen deficiency instead of menopause. Tonya requested I send a note to triage as well.

## 2018-04-07 NOTE — Telephone Encounter (Signed)
Message left for Christina Buchanan at [facility] advising our office had not yet received fax. Advised could return call and ask to speak to Triage Nurse.

## 2018-04-07 NOTE — Telephone Encounter (Signed)
Order signed by Dr. Quincy Simmonds and faxed back to [facility] to the attention of Tonya, fax #: [phone].   Encounter closed.

## 2018-04-07 NOTE — Telephone Encounter (Signed)
Order to Dr. Quincy Simmonds for signature.

## 2018-04-07 NOTE — Telephone Encounter (Signed)
Form received and given to triage nurse.

## 2018-04-09 ENCOUNTER — Ambulatory Visit
Admission: RE | Admit: 2018-04-09 | Discharge: 2018-04-09 | Disposition: A | Discharge status: Home / Self Care | Payer: 59 | Source: Ambulatory Visit | Attending: Obstetrics and Gynecology | Admitting: Obstetrics and Gynecology

## 2018-04-09 DIAGNOSIS — E2839 Other primary ovarian failure: Secondary | ICD-10-CM

## 2018-04-09 DIAGNOSIS — M85851 Other specified disorders of bone density and structure, right thigh: Secondary | ICD-10-CM | POA: Diagnosis not present

## 2018-04-09 DIAGNOSIS — Z780 Asymptomatic menopausal state: Secondary | ICD-10-CM | POA: Diagnosis not present

## 2018-04-13 DIAGNOSIS — L720 Epidermal cyst: Secondary | ICD-10-CM | POA: Diagnosis not present

## 2018-04-15 ENCOUNTER — Encounter: Payer: Self-pay | Admitting: Obstetrics and Gynecology

## 2018-04-15 ENCOUNTER — Other Ambulatory Visit: Payer: Self-pay | Admitting: Internal Medicine

## 2018-04-15 ENCOUNTER — Encounter: Payer: Self-pay | Admitting: Internal Medicine

## 2018-04-15 LAB — TISSUE TRANSGLUTAMINASE, IGA: (tTG) Ab, IgA: 1 U/mL

## 2018-04-15 LAB — GLIADIN ANTIBODIES, SERUM
Gliadin IgA: 7 Units
Gliadin IgG: 2 Units

## 2018-04-15 LAB — RETICULIN ANTIBODIES, IGA W TITER: Reticulin IGA Screen: NEGATIVE

## 2018-04-18 MED ORDER — SAXAGLIPTIN HCL 5 MG PO TABS: 5.0000 mg | ORAL_TABLET | Freq: Every day | ORAL | 5 refills | Status: DC

## 2018-04-23 MED ORDER — ELUXADOLINE 100 MG PO TABS: 100.0000 mg | ORAL_TABLET | Freq: Two times a day (BID) | ORAL | 5 refills | Status: DC

## 2018-04-23 NOTE — Addendum Note (Signed)
Addended by: Binnie Rail on: 04/23/2018 09:25 AM   Modules accepted: Orders

## 2018-04-27 ENCOUNTER — Telehealth: Payer: Self-pay | Admitting: *Deleted

## 2018-04-27 NOTE — Telephone Encounter (Signed)
[employer] PA initiated via CoverMyMeds. Key: [id number]

## 2018-04-29 NOTE — Telephone Encounter (Signed)
Left message informing pharmacy of approval.

## 2018-04-29 NOTE — Telephone Encounter (Signed)
Received fax with approval through 10/28/2018.

## 2018-05-07 DIAGNOSIS — G4733 Obstructive sleep apnea (adult) (pediatric): Secondary | ICD-10-CM | POA: Diagnosis not present

## 2018-05-13 ENCOUNTER — Other Ambulatory Visit: Payer: Self-pay | Admitting: Obstetrics & Gynecology

## 2018-05-13 NOTE — Telephone Encounter (Signed)
Medication refill request: Lexapro Last AEX:  04/02/2018 Next AEX: 04/06/2019 Last MMG (if hormonal medication request): 06/29/2017 BIRADS 1:negative Refill authorized: #90, 3 refills

## 2018-05-17 ENCOUNTER — Other Ambulatory Visit: Payer: Self-pay | Admitting: Internal Medicine

## 2018-05-17 DIAGNOSIS — Z1231 Encounter for screening mammogram for malignant neoplasm of breast: Secondary | ICD-10-CM

## 2018-05-20 ENCOUNTER — Inpatient Hospital Stay: Payer: 59 | Attending: Genetic Counselor | Admitting: Genetics

## 2018-05-20 ENCOUNTER — Inpatient Hospital Stay: Payer: 59

## 2018-05-20 DIAGNOSIS — Z1379 Encounter for other screening for genetic and chromosomal anomalies: Secondary | ICD-10-CM

## 2018-05-20 DIAGNOSIS — Z86010 Personal history of colonic polyps: Secondary | ICD-10-CM | POA: Diagnosis not present

## 2018-05-20 DIAGNOSIS — Z808 Family history of malignant neoplasm of other organs or systems: Secondary | ICD-10-CM | POA: Diagnosis not present

## 2018-05-20 DIAGNOSIS — Z803 Family history of malignant neoplasm of breast: Secondary | ICD-10-CM | POA: Diagnosis not present

## 2018-05-21 ENCOUNTER — Encounter: Payer: Self-pay | Admitting: Genetics

## 2018-05-21 DIAGNOSIS — Z803 Family history of malignant neoplasm of breast: Secondary | ICD-10-CM | POA: Insufficient documentation

## 2018-05-21 DIAGNOSIS — G4733 Obstructive sleep apnea (adult) (pediatric): Secondary | ICD-10-CM | POA: Diagnosis not present

## 2018-05-21 NOTE — Progress Notes (Addendum)
REFERRING PROVIDER: Nunzio Cobbs, MD [place], [place] [address]   PRIMARY PROVIDER:  Binnie Rail, MD  PRIMARY REASON FOR VISIT:  1. Personal history of colonic polyps   2. Family history of breast cancer     HISTORY OF PRESENT ILLNESS:   Christina Buchanan, a 62 y.o. female, was seen for a [facility] cancer genetics consultation at the request of Dr. Gala Romney due to a family history of breast cancer.  Christina Buchanan presents to clinic today to discuss the possibility of a hereditary predisposition to cancer, genetic testing, and to further clarify her future cancer risks, as well as potential cancer risks for family members.   Christina Buchanan is a 62 y.o. female with no personal history of cancer.    HORMONAL RISK FACTORS:  Menarche was at age 5.  First live birth at age 25.  OCP use for approximately 20 years.  Ovaries intact: no.  Hysterectomy: yes.  Menopausal status: postmenopausal.  HRT use: 0 years. Colonoscopy: yes; has had a few colon adenomas (about 3-4). Mammogram within the last year: yes. Number of breast biopsies: 0.  Past Medical History:  Diagnosis Date  . ALLERGIC RHINITIS   . Arthritis    hands  . DEPRESSION   . Diabetes mellitus without complication ([place]) 8546   diet controlled  . Family history of breast cancer   . Fibroid, uterine 04/29/2016  . HYPERLIPIDEMIA   . OSA (obstructive sleep apnea) 12/15/2017  . Osteopenia   . Personal history of colonic polyps   . SMOKER   . VITAMIN D DEFICIENCY     Past Surgical History:  Procedure Laterality Date  . ABDOMINAL HYSTERECTOMY N/A 10/02/2016   Procedure: HYSTERECTOMY ABDOMINAL;  Surgeon: Nunzio Cobbs, MD;  Location: [facility];  Service: Gynecology;  Laterality: N/A;  4 hours  . ABDOMINAL SACROCOLPOPEXY N/A 10/02/2016   Procedure: ABDOMINO SACROCOLPOPEXY;  Surgeon: Nunzio Cobbs, MD;  Location: [facility];  Service: Gynecology;  Laterality: N/A;  . ANTERIOR  AND POSTERIOR REPAIR N/A 10/02/2016   Procedure: ANTERIOR (CYSTOCELE) AND POSTERIOR REPAIR (RECTOCELE);  Surgeon: Nunzio Cobbs, MD;  Location: [facility];  Service: Gynecology;  Laterality: N/A;  . BLADDER SUSPENSION N/A 10/02/2016   Procedure: TRANSVAGINAL TAPE (TVT) PROCEDURE exact midurethral sling;  Surgeon: Nunzio Cobbs, MD;  Location: [facility];  Service: Gynecology;  Laterality: N/A;  . BREAST SURGERY  1990   breast biopsy  . COLONOSCOPY W/ BIOPSIES  9/07   2 polyps & tubuvillous adenoma  . COLONOSCOPY W/ BIOPSIES  11/13/08   sigmoid ddiverticuli and rechek in 5 years - Dr. Collene Mares  . CYSTOSCOPY N/A 10/02/2016   Procedure: CYSTOSCOPY;  Surgeon: Nunzio Cobbs, MD;  Location: [facility];  Service: Gynecology;  Laterality: N/A;  . ENDOMETRIAL ABLATION  08/21/03   HTA  . 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 SALPINGOOPHORECTOMY Bilateral 10/02/2016   Procedure: SALPINGO OOPHORECTOMY;  Surgeon: Nunzio Cobbs, MD;  Location: [facility];  Service: Gynecology;  Laterality: Bilateral;  . UPPER GASTROINTESTINAL ENDOSCOPY  06/15/06   2 polyps - H pylori    Social History   Socioeconomic History  . Marital status: Married    Spouse name: Not on file  . Number of children: Not on file  . Years of education: Not on file  . Highest  education level: Not on file  Occupational History  . Not on file  Social Needs  . Financial resource strain: Not on file  . Food insecurity:    Worry: Not on file    Inability: Not on file  . Transportation needs:    Medical: Not on file    Non-medical: Not on file  Tobacco Use  . Smoking status: Current Every Day Smoker    Packs/day: 0.30    Years: 30.00    Pack years: 9.00    Types: Cigarettes  . Smokeless tobacco: Never Used  . Tobacco comment: Married, lives with spouse. Cares for elderly parent and in-laws  Substance and Sexual Activity  . Alcohol use: Yes      Alcohol/week: 2.0 standard drinks    Types: 2 Standard drinks or equivalent per week    Comment: Rare glass of wine  . Drug use: No  . Sexual activity: Yes    Partners: Male    Birth control/protection: Other-see comments, Post-menopausal    Comment: husband had vasectomy  Lifestyle  . Physical activity:    Days per week: Not on file    Minutes per session: Not on file  . Stress: Not on file  Relationships  . Social connections:    Talks on phone: Not on file    Gets together: Not on file    Attends religious service: Not on file    Active member of club or organization: Not on file    Attends meetings of clubs or organizations: Not on file    Relationship status: Not on file  Other Topics Concern  . Not on file  Social History Narrative   Lives with husband in a one story home.  She has 3 grown children.     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     Education: high school.        No regular exercise     FAMILY HISTORY:  We obtained a detailed, 4-generation family history.  Significant diagnoses are listed below: Family History  Problem Relation Age of Onset  . Breast cancer Mother 27  . Diabetes Mother   . Stroke Mother               . Hyperlipidemia Mother   . Heart failure Mother   . Hypertension Mother   . COPD Mother   . Hypertension Father   . Heart disease Father   . Hyperlipidemia Father   . Heart failure Father        dec at age 55  . Breast cancer Sister 87  . Heart disease Sister        Afib  . Psoriasis Son     Christina Buchanan has 3 sons ages 40, 20, and 27 with no history of cancer. She has 5 granddaughters. Christina Buchanan has a 64 year old sister who was diagnosed with breast caner at 63 and has 2 daughters.  She had genetic testing 15 years ago (not a panel, likely only BRCA1/2).  Christina Buchanan also has a 43 year-old brother with no children.   Christina Buchanan father: died at 80 with no history of cancer.  Paternal Aunts/Uncles: 1 paternal half aunt, 1 paternal aunt, 3  paternal uncles with no history of cancer.  Paternal cousins: no history of cancer.  Paternal grandfather: unk Paternal grandmother:no history of cancer  Christina Buchanan's mother: died at 32, dx with breast cancer at 35, died of stroke.  Maternal Aunts/Uncles: 2 maternal aunts  with no history of cancer.  Maternal cousins: no history of cancer.  Maternal grandfather: died of cancer type unk Maternal grandmother:unk  Patient's maternal ancestors are of N. European/German descent, and paternal ancestors are of N. European/German descent. There is no reported Ashkenazi Jewish ancestry. There is no known consanguinity.  GENETIC COUNSELING ASSESSMENT: Christina Buchanan is a 62 y.o. female with a family history which is somewhat suggestive of a Hereditary Cancer Predisposition Syndrome. We, therefore, discussed and recommended the following at today's visit.   DISCUSSION: We reviewed the characteristics, features and inheritance patterns of hereditary cancer syndromes. We also discussed genetic testing, including the appropriate family members to test, the process of testing, insurance coverage and turn-around-time for results. We discussed the implications of a negative, positive and/or variant of uncertain significant result. We recommended Christina Buchanan pursue genetic testing for the Multi-Cancer Panel offered by [facility] includes sequencing and/or deletion duplication testing of the following 84 genes: AIP,ALK, APC, ATM, AXIN2,BAP1,  BARD1, BLM, BMPR1A, BRCA1, BRCA2, BRIP1, CASR, CDC73, CDH1, CDK4, CDKN1B, CDKN1C, CDKN2A (p14ARF), CDKN2A (p16INK4a), CEBPA, CHEK2, CTNNA1, DICER1, DIS3L2, EGFR (c.2369C>T, p.Thr790Met variant only), EPCAM (Deletion/duplication testing only), FH, FLCN, GATA2, GPC3, GREM1 (Promoter region deletion/duplication testing only), HOXB13 (c.251G>A, p.Gly84Glu), HRAS, KIT, MAX, MEN1, MET, MITF (c.952G>A, p.Glu318Lys variant only), MLH1, MSH2, MSH3, MSH6, MUTYH, NBN, NF1, NF2, NTHL1, PALB2, PDGFRA,  PHOX2B, PMS2, POLD1, POLE, POT1, PRKAR1A, PTCH1, PTEN, RAD50, RAD51C, RAD51D, RB1, RECQL4, RET, RUNX1, SDHAF2, SDHA (sequence changes only), SDHB, SDHC, SDHD, SMAD4, SMARCA4, SMARCB1, SMARCE1, STK11, SUFU, TERC, TERT, TMEM127, TP53, TSC1, TSC2, VHL, WRN and WT1.   We discussed that only 5-10% of cancers are associated with a Hereditary cancer predisposition syndrome.  One of the most common hereditary cancer syndromes that increases breast cancer risk is called Hereditary Breast and Ovarian Cancer (HBOC) syndrome.  This syndrome is caused by mutations in the BRCA1 and BRCA2 genes.  This syndrome increases an individual's lifetime risk to develop breast, ovarian, pancreatic, and other types of cancer.  There are also many other cancer predisposition syndromes caused by mutations in several other genes.  We discussed that if she is found to have a mutation in one of these genes, it may impact future medical management recommendations such as increased cancer screenings and consideration of risk reducing surgeries.  A positive result could also have implications for the patient's family members.  A Negative result would mean we were unable to identify a hereditary component to her family's cancer, but does not rule out the possibility of a hereditary basis for her family's cancer.  There could be mutations that are undetectable by current technology, or in genes not yet tested or identified to increase cancer risk.    We discussed the potential to find a Variant of Uncertain Significance or VUS.  These are variants that have not yet been identified as pathogenic or benign, and it is unknown if this variant is associated with increased cancer risk or if this is a normal finding.  Most VUS's are reclassified to benign or likely benign.   It should not be used to make medical management decisions. With time, we suspect the lab will determine the significance of any VUS's identified if any.   Based on Christina Buchanan's  family history of cancer, she meets medical criteria for genetic testing. Despite that she meets criteria, she may still have an out of pocket cost. The laboratory can provide her with an estimate of her OOP cost.  she was given the  contact information for the laboratory if she has further questions..   Based on the patient's personal and family history, the statistical model (Tyrer Cusik)   Was used to estimate her risk of developing breast cancer. This estimates her lifetime risk of developing breast cancer to be approximately 13.2%. This estimation is performed in the setting negative genetic test results.  A positive result may significantly impact this risk assessment.  The patient's lifetime breast cancer risk is a preliminary estimate based on available information using one of several models endorsed by the [place] (ACS). The ACS recommends consideration of breast MRI screening as an adjunct to mammography for patients at high risk (defined as 20% or greater lifetime risk). A more detailed breast cancer risk assessment can be considered, if clinically indicated.     We discussed that some people do not want to undergo genetic testing due to fear of genetic discrimination.  A federal law called the Genetic Information Non-Discrimination Act (GINA) of 2008 helps protect individuals against genetic discrimination based on their genetic test results.  It impacts both health insurance and employment.  For health insurance, it protects against increased premiums, being kicked off insurance or being forced to take a test in order to be insured.  For employment it protects against hiring, firing and promoting decisions based on genetic test results.  Health status due to a cancer diagnosis is not protected under GINA.  This law does not protect life insurance, disability insurance, or other types of insurance.   PLAN: After considering the risks, benefits, and limitations, Christina Buchanan  provided  informed consent to pursue genetic testing and the blood sample was [facility] to [facility] for analysis of the Multi-Cancer Panel. Results should be available within approximately 2-3 weeks' time, at which point they will be disclosed by telephone to Christina Buchanan, as will any additional recommendations warranted by these results. Christina Buchanan will receive a summary of her genetic counseling visit and a copy of her results once available. This information will also be available in Epic. We encouraged Christina Buchanan to remain in contact with cancer genetics annually so that we can continuously update the family history and inform her of any changes in cancer genetics and testing that may be of benefit for her family. Christina Buchanan questions were answered to her satisfaction today. Our contact information was provided should additional questions or concerns arise.  Based on Christina Buchanan. Bergerson's family history, we recommended her sister have updated genetic testing and maternal relatives also have genetic testing. Christina Buchanan will let us know if we can be of any assistance in coordinating genetic counseling and/or testing for this family member.   Lastly, we encouraged Christina Buchanan. Meyn to remain in contact with cancer genetics annually so that we can continuously update the family history and inform her of any changes in cancer genetics and testing that may be of benefit for this family.   Christina Buchanan.  Buchanan questions were answered to her satisfaction today. Our contact information was provided should additional questions or concerns arise. Thank you for the referral and allowing [place] to share in the care of your patient.   Tana Felts, Christina Buchanan, [facility] Certified Genetic Counselor [email or web address] phone: [phone]  The patient was seen for a total of 35 minutes in face-to-face genetic counseling.  This patient was discussed with Drs. Magrinat, Lindi Adie and/or Burr Medico who agrees with the above.

## 2018-06-04 ENCOUNTER — Ambulatory Visit (INDEPENDENT_AMBULATORY_CARE_PROVIDER_SITE_OTHER): Payer: 59 | Admitting: Pulmonary Disease

## 2018-06-04 ENCOUNTER — Telehealth: Payer: Self-pay | Admitting: Pulmonary Disease

## 2018-06-04 ENCOUNTER — Telehealth: Payer: Self-pay | Admitting: Genetics

## 2018-06-04 ENCOUNTER — Encounter: Payer: Self-pay | Admitting: Pulmonary Disease

## 2018-06-04 VITALS — BP 126/72 | HR 74 | Ht 64.0 in | Wt 157.0 lb

## 2018-06-04 DIAGNOSIS — Z9989 Dependence on other enabling machines and devices: Secondary | ICD-10-CM | POA: Diagnosis not present

## 2018-06-04 DIAGNOSIS — G4733 Obstructive sleep apnea (adult) (pediatric): Secondary | ICD-10-CM | POA: Diagnosis not present

## 2018-06-04 NOTE — Telephone Encounter (Signed)
Attempted to call patient today regarding results. I did not receive an answer at time of call. I have left a voicemail message for pt to return call. X1  

## 2018-06-04 NOTE — Patient Instructions (Signed)
Will send order to [facility] to change your CPAP mask  Will call with results of CPAP report  Follow up in 1 year

## 2018-06-04 NOTE — Telephone Encounter (Signed)
Auto CPAP 04/06/18 to 05/05/18 >> used on 22 of 30 nights with average 6 hrs 35 min.  Average AHI 3.5 with median CPAP 9 and 95 th percentile CPAP 12 cm H2O.   Please let her know CPAP report shows good control of sleep apnea with current settings.

## 2018-06-04 NOTE — Progress Notes (Signed)
[facility]  Chief Complaint  Patient presents with  . Follow-up    wearing cpap avg 6hr nightly. feels pressure & mask are okay. GYK:ZLDJT    Constitutional: BP 126/72 (BP Location: Left Arm, Cuff Size: Normal)   Pulse 74   Ht 5\' 4"  (1.626 m)   Wt 157 lb (71.2 kg)   LMP 03/06/2006 (Approximate)   SpO2 94%   BMI 26.95 kg/m   History of Present Illness: Christina Buchanan is a 62 y.o. female with obstructive sleep apnea.  She has been doing well with CPAP.  Tried nasal pillows mask, but likes nasal cushion mask better.  Not having sinus congestion, sore throat, dry mouth, or aerophagia.  Sleeping better, and more alert during the day.  Comprehensive Respiratory Exam:  Appearance - well kempt  ENMT - nasal mucosa moist, turbinates clear, midline nasal septum, no dental lesions, no gingival bleeding, no oral exudates, no tonsillar hypertrophy, MP 4, enlarged tongue, slight over bite Neck - no masses, trachea midline, no thyromegaly, no elevation in JVP Respiratory - normal appearance of chest wall, normal respiratory effort w/o accessory muscle use, no wheezing or rales CV - s1s2 regular rate and rhythm, no murmurs, no peripheral edema GI - soft, non tender Lymph - no adenopathy noted in neck and axillary areas MSK - normal muscle strength and tone, normal gait Ext - no cyanosis, clubbing, or joint inflammation noted Skin - no rashes, lesions, or ulcers Neuro - oriented to person, place, and time Psych - normal mood and affect  Assessment/Plan:  Obstructive sleep apnea. - she is compliant with CPAP and reports benefit from therapy - will change back to nasal cushion CPAP mask - continue auto CPAP - will call her with results of CPAP report   Patient Instructions  Will send order to [facility] to change your CPAP mask  Will call with results of CPAP report  Follow up in 1 year   Chesley Mires, MD [facility] [place] 06/04/2018, 9:28 AM Pager:  [phone]  Flow Sheet  Sleep tests: HST 12/09/17 >> AHI 60.3, SaO2 80%  Past Medical History: She  has a past medical history of ALLERGIC RHINITIS, Arthritis, DEPRESSION, Diabetes mellitus without complication ([place]) (0092), Family history of breast cancer, Fibroid, uterine (04/29/2016), HYPERLIPIDEMIA, OSA (obstructive sleep apnea) (12/15/2017), Osteopenia, Personal history of colonic polyps, SMOKER, and VITAMIN D DEFICIENCY.  Past Surgical History: She  has a past surgical history that includes Right colectomy (07/30/06); Breast surgery (1990); Endometrial ablation (08/21/03); Colonoscopy w/ biopsies (9/07); Upper gastrointestinal endoscopy (06/15/06); Colonoscopy w/ biopsies (11/13/08); Abdominal hysterectomy (N/A, 10/02/2016); Salpingoophorectomy (Bilateral, 10/02/2016); Abdominal sacrocolpopexy (N/A, 10/02/2016); Anterior and posterior repair (N/A, 10/02/2016); Bladder suspension (N/A, 10/02/2016); and Cystoscopy (N/A, 10/02/2016).  Family History: Her family history includes Breast cancer (age of onset: 100) in her mother; Breast cancer (age of onset: 90) in her sister; COPD in her mother; Diabetes in her mother; Heart disease in her father and sister; Heart failure in her father and mother; Hyperlipidemia in her father and mother; Hypertension in her father and mother; Psoriasis in her son; Stroke in her mother.  Social History: She  reports that she has been smoking cigarettes. She has a 9.00 pack-year smoking history. She has never used smokeless tobacco. She reports that she drinks about 2.0 standard drinks of alcohol per week. She reports that she does not use drugs.  Medications: Allergies as of 06/04/2018   No Known Allergies     Medication  List        Accurate as of 06/04/18  9:28 AM. Always use your most recent med list.          APPLE CIDER VINEGAR PO Take by mouth.   azelastine 0.1 % nasal spray Commonly known as:  ASTELIN USE ONE SPRAY  IN EACH NOSTRIL DAILY AS NEEDED   cetirizine 10 MG tablet Commonly known as:  ZYRTEC Take 10 mg by mouth daily as needed for allergies.   Cinnamon Bark Powd 500 mg by Does not apply route.   Eluxadoline 100 MG Tabs Take 1 tablet (100 mg total) by mouth 2 (two) times daily.   escitalopram 10 MG tablet Commonly known as:  LEXAPRO TAKE 1 TABLET BY MOUTH EVERY DAY   hydrocortisone 2.5 % rectal cream Commonly known as:  ANUSOL-HC Place 1 application rectally 2 (two) times daily.   hydrocortisone 25 MG suppository Commonly known as:  ANUSOL-HC Place 1 suppository (25 mg total) rectally 2 (two) times daily.   nystatin cream Commonly known as:  MYCOSTATIN Apply 1 application topically 2 (two) times daily. Apply to affected area BID   OMEGA 3 PO Take 2.5 g by mouth.   saxagliptin HCl 5 MG Tabs tablet Commonly known as:  ONGLYZA Take 1 tablet (5 mg total) by mouth daily.   simvastatin 20 MG tablet Commonly known as:  ZOCOR TAKE 1 TABLET (20 MG TOTAL) BY MOUTH DAILY AT 6 PM.   triamcinolone 0.025 % ointment Commonly known as:  KENALOG Apply 1 application topically 2 (two) times daily.   Vitamin D (Ergocalciferol) 50000 units Caps capsule Commonly known as:  DRISDOL Take 1 capsule (50,000 Units total) by mouth once a week.

## 2018-06-07 DIAGNOSIS — G4733 Obstructive sleep apnea (adult) (pediatric): Secondary | ICD-10-CM | POA: Diagnosis not present

## 2018-06-08 NOTE — Telephone Encounter (Signed)
Spoke with patient, made aware of report per MD [facility], patient requested her AHI, she was informed it was 3.5. No further questions at this time. Voiced understanding. Nothing further needed at this time.

## 2018-06-08 NOTE — Telephone Encounter (Signed)
LMTCB to review Cpap compliance.

## 2018-06-08 NOTE — Telephone Encounter (Signed)
Pt is calling back [phone]

## 2018-06-08 NOTE — Telephone Encounter (Signed)
Revealed negative genetic testing.  Revealed that a VUS's in Christina Buchanan and Christina Buchanan were identified.   This normal result is reassuring and indicates that it is unlikely Christina Buchanan has a hereditary predisposotion to cancer.   However, genetic testing is not perfect, and cannot definitively rule out a hereditary cause.  It will be important for her to keep in contact with genetics to learn if any additional testing may be needed in the future.     She is probably still at a somewhat higher risk for breast cancer simply given her family history and should continue to follow all of her healthcare providers' recommendations regarding cancer screening.   We recommend her sister have updated genetic testing.

## 2018-06-09 ENCOUNTER — Encounter: Payer: Self-pay | Admitting: Genetics

## 2018-06-09 ENCOUNTER — Ambulatory Visit: Payer: Self-pay | Admitting: Genetics

## 2018-06-09 DIAGNOSIS — Z1379 Encounter for other screening for genetic and chromosomal anomalies: Secondary | ICD-10-CM

## 2018-06-09 DIAGNOSIS — Z86010 Personal history of colonic polyps: Secondary | ICD-10-CM

## 2018-06-09 DIAGNOSIS — Z803 Family history of malignant neoplasm of breast: Secondary | ICD-10-CM

## 2018-06-09 NOTE — Progress Notes (Signed)
HPI:  Ms. Doane was previously seen in the [place] clinic on 05/20/2018 due to a family history of breast cancer and concerns regarding a hereditary predisposition to cancer. Please refer to our prior cancer genetics clinic note for more information regarding Ms. Alcalde's medical, social and family histories, and our assessment and recommendations, at the time. Ms. Persinger recent genetic test results were disclosed to her, as well as recommendations warranted by these results. These results and recommendations are discussed in more detail below.  CANCER HISTORY:   No history exists.     FAMILY HISTORY:  We obtained a detailed, 4-generation family history.  Significant diagnoses are listed below: Family History  Problem Relation Age of Onset  . Breast cancer Mother 45  . Diabetes Mother   . Stroke Mother               . Hyperlipidemia Mother   . Heart failure Mother   . Hypertension Mother   . COPD Mother   . Hypertension Father   . Heart disease Father   . Hyperlipidemia Father   . Heart failure Father        dec at age 68  . Breast cancer Sister 60  . Heart disease Sister        Afib  . Psoriasis Son     Ms. Grudzinski has 3 sons ages 36, 4, and 61 with no history of cancer. She has 5 granddaughters. Ms. Tani has a 29 year old sister who was diagnosed with breast caner at 37 and has 2 daughters.  She had genetic testing 15 years ago (not a panel, likely only BRCA1/2).  Ms. Rizzolo also has a 74 year-old brother with no children.   Ms. Pendergraph father: died at 58 with no history of cancer.  Paternal Aunts/Uncles: 1 paternal half aunt, 1 paternal aunt, 3 paternal uncles with no history of cancer.  Paternal cousins: no history of cancer.  Paternal grandfather: unk Paternal grandmother:no history of cancer  Ms. Biasi's mother: died at 68, dx with breast cancer at 30, died of stroke.  Maternal Aunts/Uncles: 2 maternal aunts with no history of cancer.  Maternal cousins: no  history of cancer.  Maternal grandfather: died of cancer type unk Maternal grandmother:unk  Patient's maternal ancestors are of N. European/German descent, and paternal ancestors are of N. European/German descent. There is no reported Ashkenazi Jewish ancestry. There is no known consanguinity.  GENETIC TEST RESULTS: Genetic testing performed through [facility]'s Multi-Cancer Panel reported out on 05/31/2018 showed no pathogenic mutations. The Multi-Cancer Panel offered by [facility] includes sequencing and/or deletion duplication testing of the following 91 genes: AIP, ALK, APC, ATM, AXIN2, BAP1, BARD1, BLM, BMPR1A, BRCA1, BRCA2, BRIP1, BUB1B, CASR, CDC73, CDH1, CDK4, CDKN1B, CDKN1C, CDKN2A, CEBPA, CEP57, CHEK2, CTNNA1, DICER1, DIS3L2, EGFR, ENG, EPCAM, FH, FLCN, GALNT12, GATA2, GPC3, GREM1, HOXB13, HRAS, KIT, MAX, MEN1, MET, MITF, MLH1, MLH3, MSH2, MSH3, MSH6, MUTYH, NBN, NF1, NF2, NTHL1, PALB2, PDGFRA, PHOX2B, PMS2, POLD1, POLE, POT1, PRKAR1A, PTCH1, PTEN, RAD50, RAD51C, RAD51D, RB1, RECQL4, RET, RNF43, RPS20, RUNX1, SDHA, SDHAF2, SDHB, SDHC, SDHD, SMAD4, SMARCA4, SMARCB1, SMARCE1, STK11, SUFU, TERC, TERT, TMEM127, TP53, TSC1, TSC2, VHL, WRN, WT1  A variant of uncertain significance (VUS) in a gene called FH was also noted. c.648T>A (p.Asp216Glu) A variant of uncertain significance (VUS) in a gene called PDGFRA was also noted. c.2897A>G (p.His966Arg)  The test report will be scanned into EPIC and will be located under the Molecular Pathology section of the Results Review  tab. A portion of the result report is included below for reference.     We discussed with Ms. Batdorf that because current genetic testing is not perfect, it is possible there may be a gene mutation in one of these genes that current testing cannot detect, but that chance is small.  We also discussed, that there could be another gene that has not yet been discovered, or that we have not yet tested, that is responsible for the cancer  diagnoses in the family. It is also possible there is a hereditary cause for the cancer in the family that Ms. Bierly did not inherit and therefore was not identified in her testing.  Therefore, it is important to remain in touch with cancer genetics in the future so that we can continue to offer Ms. Moren the most up to date genetic testing.   Regarding the VUS's in [place] and PDGFRA: At this time, it is unknown if these variants are associated with increased cancer risk or if they are normal findings, but most variants such as these get reclassified to being inconsequential. They should not be used to make medical management decisions. With time, we suspect the lab will determine the significance of these variants, if any. If we do learn more about them, we will try to contact Ms. Abshier to discuss it further. However, it is important to stay in touch with [place] periodically and keep the address and phone number up to date.  ADDITIONAL GENETIC TESTING: We discussed with Ms. Bodnar that her genetic testing was fairly extensive.  If there are are genes identified to increase cancer risk that can be analyzed in the future, we would be happy to discuss and coordinate this testing at that time.    CANCER SCREENING RECOMMENDATIONS: Ms. Hagmann test result is considered negative (normal).  This means that we have not identified a hereditary predisposition to cancer in her at this time.   While reassuring, this does not definitively rule out a hereditary predisposition to cancer. It is still possible that there could be genetic mutations that are undetectable by current technology, or genetic mutations in genes that have not been tested or identified to increase cancer risk.  Therefore, it is recommended she continue to follow the cancer management and screening guidelines provided by her oncology and primary healthcare provider. An individual's cancer risk is not determined by genetic test results alone.  Overall cancer risk  assessment includes additional factors such as personal medical history, family history, etc.  These should be used to make a personalized plan for cancer prevention and surveillance.    Based on the patient's personal and family history, the statistical model (Tyrer Cusik)   Was used to estimate her risk of developing breast cancer. This estimates her lifetime risk of developing breast cancer to be approximately 13.2%. This estimation is performed in the setting negative genetic test results.  A positive result may significantly impact this risk assessment.  The patient's lifetime breast cancer risk is a preliminary estimate based on available information using one of several models endorsed by the [place] (ACS). The ACS recommends consideration of breast MRI screening as an adjunct to mammography for patients at high risk (defined as 20% or greater lifetime risk). A more detailed breast cancer risk assessment can be considered, if clinically indicated.     RECOMMENDATIONS FOR FAMILY MEMBERS:  Relatives in this family might be at some increased risk of developing cancer, over the general population risk, simply  due to the family history of cancer.  We recommended women in this family have a yearly mammogram beginning at age 74, or 19 years younger than the earliest onset of cancer, an annual clinical breast exam, and perform monthly breast self-exams. Women in this family should also have a gynecological exam as recommended by their primary provider. All family members should have a colonoscopy by age 41 (or as directed by their doctors).  All family members should inform their physicians about the family history of cancer so their doctors can make the most appropriate screening recommendations for them.   It is also possible there is a hereditary cause for the cancer in Ms. Burcher's family that she did not inherit and therefore was not identified in her.   Therefore, we recommended her  sister have updated genetic testing and/or her maternal relatives have genetic counseling and testing. Ms. Marquina will let us know if we can be of any assistance in coordinating genetic counseling and/or testing for these family members.   FOLLOW-UP: Lastly, we discussed with Ms. Couse that cancer genetics is a rapidly advancing field and it is possible that new genetic tests will be appropriate for her and/or her family members in the future. We encouraged her to remain in contact with cancer genetics on an annual basis so we can update her personal and family histories and let her know of advances in cancer genetics that may benefit this family.   Our contact number was provided. Ms. Pedley questions were answered to her satisfaction, and she knows she is welcome to call us at anytime with additional questions or concerns.   Ferol Luz, MS, [facility] Certified Genetic Counselor [email or web address]

## 2018-06-14 DIAGNOSIS — G4733 Obstructive sleep apnea (adult) (pediatric): Secondary | ICD-10-CM | POA: Diagnosis not present

## 2018-07-01 ENCOUNTER — Ambulatory Visit: Payer: 59

## 2018-07-02 DIAGNOSIS — J019 Acute sinusitis, unspecified: Secondary | ICD-10-CM | POA: Diagnosis not present

## 2018-07-07 DIAGNOSIS — G4733 Obstructive sleep apnea (adult) (pediatric): Secondary | ICD-10-CM | POA: Diagnosis not present

## 2018-07-14 DIAGNOSIS — G4733 Obstructive sleep apnea (adult) (pediatric): Secondary | ICD-10-CM | POA: Diagnosis not present

## 2018-07-20 ENCOUNTER — Other Ambulatory Visit: Payer: Self-pay | Admitting: Internal Medicine

## 2018-07-23 ENCOUNTER — Ambulatory Visit: Payer: 59

## 2018-07-29 DIAGNOSIS — M71571 Other bursitis, not elsewhere classified, right ankle and foot: Secondary | ICD-10-CM | POA: Diagnosis not present

## 2018-07-29 DIAGNOSIS — M25774 Osteophyte, right foot: Secondary | ICD-10-CM | POA: Diagnosis not present

## 2018-07-29 DIAGNOSIS — M25775 Osteophyte, left foot: Secondary | ICD-10-CM | POA: Diagnosis not present

## 2018-08-07 DIAGNOSIS — G4733 Obstructive sleep apnea (adult) (pediatric): Secondary | ICD-10-CM | POA: Diagnosis not present

## 2018-08-16 ENCOUNTER — Other Ambulatory Visit: Payer: Self-pay | Admitting: Internal Medicine

## 2018-08-18 ENCOUNTER — Encounter: Payer: Self-pay | Admitting: Podiatry

## 2018-08-18 ENCOUNTER — Ambulatory Visit (INDEPENDENT_AMBULATORY_CARE_PROVIDER_SITE_OTHER): Payer: 59

## 2018-08-18 ENCOUNTER — Other Ambulatory Visit: Payer: Self-pay | Admitting: Podiatry

## 2018-08-18 ENCOUNTER — Ambulatory Visit: Payer: 59 | Admitting: Podiatry

## 2018-08-18 VITALS — BP 114/69 | HR 70

## 2018-08-18 DIAGNOSIS — M79671 Pain in right foot: Secondary | ICD-10-CM

## 2018-08-18 DIAGNOSIS — M898X7 Other specified disorders of bone, ankle and foot: Secondary | ICD-10-CM

## 2018-08-18 DIAGNOSIS — M79672 Pain in left foot: Principal | ICD-10-CM

## 2018-08-18 NOTE — Patient Instructions (Signed)
Pre-Operative Instructions  Congratulations, you have decided to take an important step towards improving your quality of life.  You can be assured that the doctors and staff at [facility] will be with you every step of the way.  Here are some important things you should know:  1. Plan to be at the surgery center/hospital at least 1 (one) hour prior to your scheduled time, unless otherwise directed by the surgical center/hospital staff.  You must have a responsible adult accompany you, remain during the surgery and drive you home.  Make sure you have directions to the surgical center/hospital to ensure you arrive on time. 2. If you are having surgery at [facility] or [facility] hospitals, you will need a copy of your medical history and physical form from your family physician within one month prior to the date of surgery. We will give you a form for your primary physician to complete.  3. We make every effort to accommodate the date you request for surgery.  However, there are times where surgery dates or times have to be moved.  We will contact you as soon as possible if a change in schedule is required.   4. No aspirin/ibuprofen for one week before surgery.  If you are on aspirin, any non-steroidal anti-inflammatory medications (Mobic, Aleve, Ibuprofen) should not be taken seven (7) days prior to your surgery.  You make take Tylenol for pain prior to surgery.  5. Medications - If you are taking daily heart and blood pressure medications, seizure, reflux, allergy, asthma, anxiety, pain or diabetes medications, make sure you notify the surgery center/hospital before the day of surgery so they can tell you which medications you should take or avoid the day of surgery. 6. No food or drink after midnight the night before surgery unless directed otherwise by surgical center/hospital staff. 7. No alcoholic beverages 24-hours prior to surgery.  No smoking 24-hours prior or 24-hours after  surgery. 8. Wear loose pants or shorts. They should be loose enough to fit over bandages, boots, and casts. 9. Don't wear slip-on shoes. Sneakers are preferred. 10. Bring your boot with you to the surgery center/hospital.  Also bring crutches or a walker if your physician has prescribed it for you.  If you do not have this equipment, it will be provided for you after surgery. 11. If you have not been contacted by the surgery center/hospital by the day before your surgery, call to confirm the date and time of your surgery. 12. Leave-time from work may vary depending on the type of surgery you have.  Appropriate arrangements should be made prior to surgery with your employer. 13. Prescriptions will be provided immediately following surgery by your doctor.  Fill these as soon as possible after surgery and take the medication as directed. Pain medications will not be refilled on weekends and must be approved by the doctor. 14. Remove nail polish on the operative foot and avoid getting pedicures prior to surgery. 15. Wash the night before surgery.  The night before surgery wash the foot and leg well with water and the antibacterial soap provided. Be sure to pay special attention to beneath the toenails and in between the toes.  Wash for at least three (3) minutes. Rinse thoroughly with water and dry well with a towel.  Perform this wash unless told not to do so by your physician.  Enclosed: 1 Ice pack (please put in freezer the night before surgery)   1 Hibiclens skin cleaner     Pre-op instructions  If you have any questions regarding the instructions, please do not hesitate to call our office.  [place]: [address], [place], [place] [address] -- [phone]  [place]: [address] [place], [place] [address] -- [phone]  [place]: [address]  [place], [place] [address] -- [phone]  [place]: [address], [address], [place],  [address] -- [phone]  Website:  [email or web address] 

## 2018-08-22 NOTE — Progress Notes (Signed)
   HPI: 62 year old female presenting today as a new patient with a chief complaint of pain to the bilateral fifth toes that began about five years ago. She was told, by Dr. Gershon Mussel, that she had bone spurs in the fifth toes. Wearing shoes increases the pain. She has not done anything for treatment. Patient is here for further evaluation and treatment.   Past Medical History:  Diagnosis Date  . ALLERGIC RHINITIS   . Arthritis    hands  . DEPRESSION   . Diabetes mellitus without complication ([place]) 3007   diet controlled  . Family history of breast cancer   . Fibroid, uterine 04/29/2016  . HYPERLIPIDEMIA   . OSA (obstructive sleep apnea) 12/15/2017  . Osteopenia   . Personal history of colonic polyps   . SMOKER   . VITAMIN D DEFICIENCY      Physical Exam: General: The patient is alert and oriented x3 in no acute distress.  Dermatology: Skin is warm, dry and supple bilateral lower extremities. Negative for open lesions or macerations.  Vascular: Palpable pedal pulses bilaterally. No edema or erythema noted. Capillary refill within normal limits.  Neurological: Epicritic and protective threshold grossly intact bilaterally.   Musculoskeletal Exam: Exostosis of bilateral fifth toes noted. Range of motion within normal limits to all pedal and ankle joints bilateral. Muscle strength 5/5 in all groups bilateral.   Radiographic Exam:  Exostosis noted to bilateral fifth toes. No fracture/dislocation/boney destruction.    Assessment: 1. Exostosis bilateral fifth toes    Plan of Care:  1. Patient evaluated. X-Rays reviewed.  2. Today we discussed the conservative versus surgical management of the presenting pathology. The patient opts for surgical management. All possible complications and details of the procedure were explained. All patient questions were answered. No guarantees were expressed or implied. 3. Authorization for surgery was initiated today. Surgery will consist of exostectomy  bilateral fifth toes.  4. Post op shoes dispensed bilateral.  5. Return to clinic one week post op.      Edrick Kins, DPM [facility]  Dr. Edrick Kins, DPM    [address],  [address]                Office [phone]  Fax [phone]

## 2018-08-30 ENCOUNTER — Telehealth: Payer: Self-pay | Admitting: *Deleted

## 2018-08-30 ENCOUNTER — Ambulatory Visit
Admission: RE | Admit: 2018-08-30 | Discharge: 2018-08-30 | Disposition: A | Discharge status: Home / Self Care | Payer: 59 | Source: Ambulatory Visit | Attending: Internal Medicine | Admitting: Internal Medicine

## 2018-08-30 DIAGNOSIS — Z1231 Encounter for screening mammogram for malignant neoplasm of breast: Secondary | ICD-10-CM | POA: Diagnosis not present

## 2018-08-30 NOTE — Telephone Encounter (Signed)
"  I am supposed to have surgery on my toes on the thirteenth.  I noticed in MyChart that you scheduled the post-op and everything.  I want to know if you have scheduled the surgery yet.  If you could, call me.  Thank you so much, have a great day."  I left the patient a message that we do have her scheduled for September 17, 2018 with Dr. Amalia Hailey.  The facility is not a part of [facility], that's why you do not see your appointment in [place].  I asked her to call if she has any additional questions.

## 2018-09-06 DIAGNOSIS — G4733 Obstructive sleep apnea (adult) (pediatric): Secondary | ICD-10-CM | POA: Diagnosis not present

## 2018-09-07 ENCOUNTER — Telehealth: Payer: Self-pay | Admitting: *Deleted

## 2018-09-07 NOTE — Telephone Encounter (Signed)
"  I'm scheduled for surgery on December 13.  I don't know the time I'm supposed to get there."  Someone from the surgical center will give you a call a day or two prior to your surgery date.  They will give you your arrival time.  "You can't tell me if it will be morning or afternoon?"  No, you can call the surgical center and see if they can give you a tentative arrival time.  "What's their number?"  It is [phone].

## 2018-09-15 ENCOUNTER — Encounter: Payer: Self-pay | Admitting: Podiatry

## 2018-09-17 ENCOUNTER — Other Ambulatory Visit: Payer: Self-pay | Admitting: Podiatry

## 2018-09-17 DIAGNOSIS — M25775 Osteophyte, left foot: Secondary | ICD-10-CM | POA: Diagnosis not present

## 2018-09-17 DIAGNOSIS — M898X7 Other specified disorders of bone, ankle and foot: Secondary | ICD-10-CM | POA: Diagnosis not present

## 2018-09-17 DIAGNOSIS — M25774 Osteophyte, right foot: Secondary | ICD-10-CM | POA: Diagnosis not present

## 2018-09-17 MED ORDER — TRAMADOL HCL 50 MG PO TABS: 50.0000 mg | ORAL_TABLET | Freq: Four times a day (QID) | ORAL | 0 refills | Status: DC | PRN

## 2018-09-17 MED ORDER — IBUPROFEN 800 MG PO TABS: 800.0000 mg | ORAL_TABLET | Freq: Three times a day (TID) | ORAL | 0 refills | Status: DC | PRN

## 2018-09-17 NOTE — Progress Notes (Signed)
.  postop

## 2018-09-22 ENCOUNTER — Ambulatory Visit (INDEPENDENT_AMBULATORY_CARE_PROVIDER_SITE_OTHER): Payer: 59

## 2018-09-22 ENCOUNTER — Ambulatory Visit (INDEPENDENT_AMBULATORY_CARE_PROVIDER_SITE_OTHER): Payer: 59 | Admitting: Podiatry

## 2018-09-22 VITALS — Temp 96.7°F

## 2018-09-22 DIAGNOSIS — M898X7 Other specified disorders of bone, ankle and foot: Secondary | ICD-10-CM

## 2018-09-22 DIAGNOSIS — Z9889 Other specified postprocedural states: Secondary | ICD-10-CM

## 2018-09-24 NOTE — Progress Notes (Signed)
   Subjective:  Patient presents today status post bilateral 5th toe exostectomy. DOS: 09/17/18. She states she is doing well. She denies any pain or modifying factors. She has been using the post op shoes as directed. Patient is here for further evaluation and treatment.    Past Medical History:  Diagnosis Date  . ALLERGIC RHINITIS   . Arthritis    hands  . DEPRESSION   . Diabetes mellitus without complication ([place]) 1610   diet controlled  . Family history of breast cancer   . Fibroid, uterine 04/29/2016  . HYPERLIPIDEMIA   . OSA (obstructive sleep apnea) 12/15/2017  . Osteopenia   . Personal history of colonic polyps   . SMOKER   . VITAMIN D DEFICIENCY       Objective/Physical Exam Neurovascular status intact.  Skin incisions appear to be well coapted with sutures and staples intact. No sign of infectious process noted. No dehiscence. No active bleeding noted. Moderate edema noted to the surgical extremity.  Radiographic Exam:  Orthopedic hardware and osteotomies sites appear to be stable with routine healing.  Assessment: 1. s/p bilateral 5th toe exostectomy. DOS: 09/17/18   Plan of Care:  1. Patient was evaluated. X-rays reviewed 2. Dressings removed. Recommended antibiotic ointment daily with a bandage.  3. Discontinue using post op shoes. Recommended good sneakers.  4. Return to clinic in 2 weeks for suture removal.    Edrick Kins, DPM [facility]  Dr. Edrick Kins, [address]                                        [place], [place] [address]                Office [phone]  Fax [phone]

## 2018-10-07 DIAGNOSIS — G4733 Obstructive sleep apnea (adult) (pediatric): Secondary | ICD-10-CM | POA: Diagnosis not present

## 2018-10-07 NOTE — Progress Notes (Signed)
Subjective:    Patient ID: Christina Buchanan, female    DOB: 03/08/1956, 63 y.o.   MRN: [id number]  HPI The patient is here for follow up.  Diabetes: She is taking her medication daily as prescribed. She has not been compliant with a diabetic diet over the holiday, but prior to that she was. She is not exercising regularly. She checks her feet daily and denies foot lesions. She is up-to-date with an ophthalmology examination.   Hyperlipidemia: She is taking her medication daily. She is compliant with a low fat/cholesterol diet. She is not exercising regularly. She denies myalgias.   Depression: She is taking her medication daily as prescribed. She denies any side effects from the medication. She feels her depression is well controlled and she is happy with her current dose of medication.   IBS:   She is taking viberzi daily. She states her BMs are normal.  She does have gas but it is not bad.  She denies abdominal pain.    OSA:  She uses her cpap nightly.    She is not able to stop the weight gain.  Her hair has gotten dry.  She worries about her thyroid.  She knows portion size may also be an issue.  She plans on starting to exercise.  Medications and allergies reviewed with patient and updated if appropriate.  Patient Active Problem List   Diagnosis Date Noted  . Genetic testing 06/09/2018  . Family history of breast cancer   . Diarrhea 04/05/2018  . OSA (obstructive sleep apnea) 12/15/2017  . Wears hearing aid in both ears 09/08/2017  . Hemorrhoids 09/08/2017  . Status post total abdominal hysterectomy and bilateral salpingo-oophorectomy 10/02/2016  . Diabetes ([place]) 08/12/2016  . Pure hypercholesterolemia 04/29/2016  . PMB (postmenopausal bleeding) 04/29/2016  . Hematuria 04/29/2016  . Estrogen deficiency 04/29/2016  . Vitamin D deficiency 01/29/2010  . SMOKER 01/21/2010  . Depression 01/21/2010  . ALLERGIC RHINITIS 01/21/2010  . Personal history of colonic polyps  07/30/2006    Current Outpatient Medications on File Prior to Visit  Medication Sig Dispense Refill  . azelastine (ASTELIN) 0.1 % nasal spray USE ONE SPRAY IN EACH NOSTRIL DAILY AS NEEDED 30 mL 0  . cetirizine (ZYRTEC) 10 MG tablet Take 10 mg by mouth daily as needed for allergies.     . Eluxadoline (VIBERZI) 100 MG TABS Take 1 tablet (100 mg total) by mouth 2 (two) times daily. 60 tablet 5  . escitalopram (LEXAPRO) 10 MG tablet TAKE 1 TABLET BY MOUTH EVERY DAY 90 tablet 3  . Omega-3 Fatty Acids (OMEGA 3 PO) Take 2.5 g by mouth.    . saxagliptin HCl (ONGLYZA) 5 MG TABS tablet Take 1 tablet (5 mg total) by mouth daily. Annual appt due in Dec must see provider for future refills 90 tablet 0  . simvastatin (ZOCOR) 20 MG tablet TAKE 1 TABLET (20 MG TOTAL) BY MOUTH DAILY AT 6 PM. 90 tablet 3  . traMADol (ULTRAM) 50 MG tablet Take 1 tablet (50 mg total) by mouth every 6 (six) hours as needed. 30 tablet 0   No current facility-administered medications on file prior to visit.     Past Medical History:  Diagnosis Date  . ALLERGIC RHINITIS   . Arthritis    hands  . DEPRESSION   . Diabetes mellitus without complication ([place]) 5809   diet controlled  . Family history of breast cancer   . Fibroid, uterine 04/29/2016  . HYPERLIPIDEMIA   .  OSA (obstructive sleep apnea) 12/15/2017  . Osteopenia   . Personal history of colonic polyps   . SMOKER   . VITAMIN D DEFICIENCY     Past Surgical History:  Procedure Laterality Date  . ABDOMINAL HYSTERECTOMY N/A 10/02/2016   Procedure: HYSTERECTOMY ABDOMINAL;  Surgeon: Nunzio Cobbs, MD;  Location: [facility];  Service: Gynecology;  Laterality: N/A;  4 hours  . ABDOMINAL SACROCOLPOPEXY N/A 10/02/2016   Procedure: ABDOMINO SACROCOLPOPEXY;  Surgeon: Nunzio Cobbs, MD;  Location: [facility];  Service: Gynecology;  Laterality: N/A;  . ANTERIOR AND POSTERIOR REPAIR N/A 10/02/2016   Procedure: ANTERIOR (CYSTOCELE) AND POSTERIOR REPAIR (RECTOCELE);   Surgeon: Nunzio Cobbs, MD;  Location: [facility];  Service: Gynecology;  Laterality: N/A;  . BLADDER SUSPENSION N/A 10/02/2016   Procedure: TRANSVAGINAL TAPE (TVT) PROCEDURE exact midurethral sling;  Surgeon: Nunzio Cobbs, MD;  Location: [facility];  Service: Gynecology;  Laterality: N/A;  . BREAST SURGERY  1990   breast biopsy  . COLONOSCOPY W/ BIOPSIES  9/07   2 polyps & tubuvillous adenoma  . COLONOSCOPY W/ BIOPSIES  11/13/08   sigmoid ddiverticuli and rechek in 5 years - Dr. Collene Mares  . CYSTOSCOPY N/A 10/02/2016   Procedure: CYSTOSCOPY;  Surgeon: Nunzio Cobbs, MD;  Location: [facility];  Service: Gynecology;  Laterality: N/A;  . ENDOMETRIAL ABLATION  08/21/03   HTA  . 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 SALPINGOOPHORECTOMY Bilateral 10/02/2016   Procedure: SALPINGO OOPHORECTOMY;  Surgeon: Nunzio Cobbs, MD;  Location: [facility];  Service: Gynecology;  Laterality: Bilateral;  . UPPER GASTROINTESTINAL ENDOSCOPY  06/15/06   2 polyps - H pylori    Social History   Socioeconomic History  . Marital status: Married    Spouse name: Not on file  . Number of children: Not on file  . Years of education: Not on file  . Highest education level: Not on file  Occupational History  . Not on file  Social Needs  . Financial resource strain: Not on file  . Food insecurity:    Worry: Not on file    Inability: Not on file  . Transportation needs:    Medical: Not on file    Non-medical: Not on file  Tobacco Use  . Smoking status: Current Every Day Smoker    Packs/day: 0.30    Years: 30.00    Pack years: 9.00    Types: Cigarettes  . Smokeless tobacco: Never Used  . Tobacco comment: Married, lives with spouse. Cares for elderly parent and in-laws  Substance and Sexual Activity  . Alcohol use: Yes    Alcohol/week: 2.0 standard drinks    Types: 2 Standard drinks or equivalent per week    Comment:  Rare glass of wine  . Drug use: No  . Sexual activity: Yes    Partners: Male    Birth control/protection: Other-see comments, Post-menopausal    Comment: husband had vasectomy  Lifestyle  . Physical activity:    Days per week: Not on file    Minutes per session: Not on file  . Stress: Not on file  Relationships  . Social connections:    Talks on phone: Not on file    Gets together: Not on file    Attends religious service: Not on file    Active member of club or organization: Not on file  Attends meetings of clubs or organizations: Not on file    Relationship status: Not on file  Other Topics Concern  . Not on file  Social History Narrative   Lives with husband in a one story home.  She has 3 grown children.     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     Education: high school.        No regular exercise    Family History  Problem Relation Age of Onset  . Breast cancer Mother 9  . Diabetes Mother   . Stroke Mother               . Hyperlipidemia Mother   . Heart failure Mother   . Hypertension Mother   . COPD Mother   . Hypertension Father   . Heart disease Father   . Hyperlipidemia Father   . Heart failure Father        dec at age 27  . Breast cancer Sister 61  . Heart disease Sister        Afib  . Psoriasis Son     Review of Systems  Constitutional: Negative for chills and fever.  Respiratory: Negative for cough, shortness of breath and wheezing.   Cardiovascular: Negative for chest pain, palpitations and leg swelling.  Neurological: Negative for light-headedness and headaches.       Objective:   Vitals:   10/08/18 0934  BP: 132/78  Pulse: 69  Resp: 16  Temp: 98.5 F (36.9 C)  SpO2: 95%   BP Readings from Last 3 Encounters:  10/08/18 132/78  08/18/18 114/69  06/04/18 126/72   Wt Readings from Last 3 Encounters:  10/08/18 162 lb (73.5 kg)  06/04/18 157 lb (71.2 kg)  04/05/18 152 lb (68.9 kg)   Body mass index is 27.81 kg/m.   Physical Exam      Constitutional: Appears well-developed and well-nourished. No distress.  HENT:  Head: Normocephalic and atraumatic.  Neck: Neck supple. No tracheal deviation present. No thyromegaly present.  No cervical lymphadenopathy Cardiovascular: Normal rate, regular rhythm and normal heart sounds.   No murmur heard. No carotid bruit .  No edema Pulmonary/Chest: Effort normal and breath sounds normal. No respiratory distress. No has no wheezes. No rales.  Skin: Skin is warm and dry. Not diaphoretic.  Psychiatric: Normal mood and affect. Behavior is normal.   Diabetic Foot Exam - Simple   Simple Foot Form Diabetic Foot exam was performed with the following findings:  Yes 10/08/2018 10:08 AM  Visual Inspection No deformities, no ulcerations, no other skin breakdown bilaterally:  Yes Sensation Testing Intact to touch and monofilament testing bilaterally:  Yes Pulse Check Posterior Tibialis and Dorsalis pulse intact bilaterally:  Yes Comments Sutures on b/l 5th toes from recent surgery - minimal swelling - no erythema        Assessment & Plan:    See Problem List for Assessment and Plan of chronic medical problems.

## 2018-10-07 NOTE — Patient Instructions (Addendum)
  Tests ordered today. Your results will be released to [facility] (or called to you) after review, usually within 72hours after test completion. If any changes need to be made, you will be notified at that same time.  Flu immunization administered today.    Medications reviewed and updated.  Changes include :   ozempic injection weekly.   Please followup in 6 months

## 2018-10-08 ENCOUNTER — Ambulatory Visit: Payer: 59 | Admitting: Internal Medicine

## 2018-10-08 ENCOUNTER — Other Ambulatory Visit (INDEPENDENT_AMBULATORY_CARE_PROVIDER_SITE_OTHER): Payer: 59

## 2018-10-08 ENCOUNTER — Encounter: Payer: Self-pay | Admitting: Internal Medicine

## 2018-10-08 VITALS — BP 132/78 | HR 69 | Temp 98.5°F | Resp 16 | Ht 64.0 in | Wt 162.0 lb

## 2018-10-08 DIAGNOSIS — Z23 Encounter for immunization: Secondary | ICD-10-CM | POA: Diagnosis not present

## 2018-10-08 DIAGNOSIS — L679 Hair color and hair shaft abnormality, unspecified: Secondary | ICD-10-CM | POA: Insufficient documentation

## 2018-10-08 DIAGNOSIS — G4733 Obstructive sleep apnea (adult) (pediatric): Secondary | ICD-10-CM | POA: Diagnosis not present

## 2018-10-08 DIAGNOSIS — E119 Type 2 diabetes mellitus without complications: Secondary | ICD-10-CM | POA: Diagnosis not present

## 2018-10-08 DIAGNOSIS — R635 Abnormal weight gain: Secondary | ICD-10-CM

## 2018-10-08 DIAGNOSIS — E7800 Pure hypercholesterolemia, unspecified: Secondary | ICD-10-CM

## 2018-10-08 DIAGNOSIS — F3289 Other specified depressive episodes: Secondary | ICD-10-CM | POA: Diagnosis not present

## 2018-10-08 LAB — LIPID PANEL
Cholesterol: 152 mg/dL (ref 0–200)
HDL: 41.3 mg/dL (ref >39.00)
LDL Cholesterol: 85 mg/dL (ref 0–99)
NonHDL: 110.61
Total CHOL/HDL Ratio: 4
Triglycerides: 126 mg/dL (ref 0.0–149.0)
VLDL: 25.2 mg/dL (ref 0.0–40.0)

## 2018-10-08 LAB — COMPREHENSIVE METABOLIC PANEL
ALT: 28 U/L (ref 0–35)
AST: 21 U/L (ref 0–37)
Albumin: 4.2 g/dL (ref 3.5–5.2)
Alkaline Phosphatase: 49 U/L (ref 39–117)
BUN: 17 mg/dL (ref 6–23)
CO2: 23 mEq/L (ref 19–32)
Calcium: 9.1 mg/dL (ref 8.4–10.5)
Chloride: 106 mEq/L (ref 96–112)
Creatinine, Ser: 0.59 mg/dL (ref 0.40–1.20)
GFR: 109.18 mL/min (ref >60.00)
Glucose, Bld: 132 mg/dL — ABNORMAL HIGH (ref 70–99)
Potassium: 4.1 mEq/L (ref 3.5–5.1)
Sodium: 139 mEq/L (ref 135–145)
Total Bilirubin: 0.5 mg/dL (ref 0.2–1.2)
Total Protein: 6.9 g/dL (ref 6.0–8.3)

## 2018-10-08 LAB — T4, FREE: Free T4: 0.59 ng/dL — ABNORMAL LOW (ref 0.60–1.60)

## 2018-10-08 LAB — HEMOGLOBIN A1C: Hgb A1c MFr Bld: 6.9 % — ABNORMAL HIGH (ref 4.6–6.5)

## 2018-10-08 LAB — TSH: TSH: 1.03 u[IU]/mL (ref 0.35–4.50)

## 2018-10-08 LAB — T3, FREE: T3, Free: 3.6 pg/mL (ref 2.3–4.2)

## 2018-10-08 MED ORDER — SEMAGLUTIDE(0.25 OR 0.5MG/DOS) 2 MG/1.5ML ~~LOC~~ SOPN: PEN_INJECTOR | SUBCUTANEOUS | 5 refills | Status: AC

## 2018-10-08 NOTE — Assessment & Plan Note (Signed)
Concern for possible thyroid cause Will check TFTs

## 2018-10-08 NOTE — Assessment & Plan Note (Signed)
Controlled, stable Continue current dose of medication  

## 2018-10-08 NOTE — Assessment & Plan Note (Signed)
Using CPAP nightly-continue 

## 2018-10-08 NOTE — Assessment & Plan Note (Signed)
Continues to gain weight and has also noticed changes in her hair Will check TFTs to rule out thyroid cause As far as weight gain stressed the importance of regular exercise Also discussed decreased portions and low sugar/carbohydrate diet

## 2018-10-08 NOTE — Assessment & Plan Note (Signed)
Check A1c Fairly compliant with a diabetic diet, except for over the holidays Stressed regular exercise She is struggling with weight-decrease portions Continue Onglyza We will do a trial of Ozempic-she may not be able to tolerate this due to her IBS Follow-up in 6 months

## 2018-10-08 NOTE — Assessment & Plan Note (Signed)
Check lipid panel  Continue daily statin Regular exercise and healthy diet encouraged  

## 2018-10-09 ENCOUNTER — Encounter: Payer: Self-pay | Admitting: Internal Medicine

## 2018-10-11 ENCOUNTER — Ambulatory Visit (INDEPENDENT_AMBULATORY_CARE_PROVIDER_SITE_OTHER): Payer: 59 | Admitting: Podiatry

## 2018-10-11 DIAGNOSIS — Z9889 Other specified postprocedural states: Secondary | ICD-10-CM

## 2018-10-11 DIAGNOSIS — M898X7 Other specified disorders of bone, ankle and foot: Secondary | ICD-10-CM

## 2018-10-13 NOTE — Progress Notes (Signed)
   Subjective:  Patient presents today status post bilateral 5th toe exostectomy. DOS: 09/17/18. She states she is doing well. She denies any pain or modifying factors. She has been wearing regular shoes and sneakers since her last appointment. Patient is here for further evaluation and treatment.    Past Medical History:  Diagnosis Date  . ALLERGIC RHINITIS   . Arthritis    hands  . DEPRESSION   . Diabetes mellitus without complication ([place]) 0277   diet controlled  . Family history of breast cancer   . Fibroid, uterine 04/29/2016  . HYPERLIPIDEMIA   . OSA (obstructive sleep apnea) 12/15/2017  . Osteopenia   . Personal history of colonic polyps   . SMOKER   . VITAMIN D DEFICIENCY       Objective/Physical Exam Neurovascular status intact.  Skin incisions appear to be well coapted with sutures and staples intact. No sign of infectious process noted. No dehiscence. No active bleeding noted. Moderate edema noted to the surgical extremity.  Assessment: 1. s/p bilateral 5th toe exostectomy. DOS: 09/17/18   Plan of Care:  1. Patient was evaluated.  2. Sutures removed.  3. Recommended good shoe gear.  4. May resume full activity with no restrictions.  5. Return to clinic as needed.    Edrick Kins, DPM [facility]  Dr. Edrick Kins, [address]                                        [place], [place] [address]                Office [phone]  Fax [phone]

## 2018-10-14 DIAGNOSIS — G4733 Obstructive sleep apnea (adult) (pediatric): Secondary | ICD-10-CM | POA: Diagnosis not present

## 2018-10-17 ENCOUNTER — Other Ambulatory Visit: Payer: Self-pay | Admitting: Internal Medicine

## 2018-10-18 MED ORDER — ELUXADOLINE 100 MG PO TABS: 1.0000 | ORAL_TABLET | Freq: Two times a day (BID) | ORAL | 2 refills | Status: DC

## 2018-11-07 DIAGNOSIS — G4733 Obstructive sleep apnea (adult) (pediatric): Secondary | ICD-10-CM | POA: Diagnosis not present

## 2018-11-11 ENCOUNTER — Other Ambulatory Visit: Payer: Self-pay | Admitting: Internal Medicine

## 2018-12-02 DIAGNOSIS — G4733 Obstructive sleep apnea (adult) (pediatric): Secondary | ICD-10-CM

## 2018-12-02 NOTE — Telephone Encounter (Signed)
Dr Halford Chessman, patient sent this message     Dr. Halford Chessman, My husband and I both have been using our cpap machines.But every morning when we wake up, we are full of gas.I burp so much... what can we do about this.Love my machine for sleeping, but I hate having all this gas. Christina Buchanan [phone]  Message routed to Dr Halford Chessman to advise

## 2018-12-04 NOTE — Telephone Encounter (Signed)
Please get a copy of her last 30 days CPAP download.  Will then see if her pressure setting can be decreased to minimize effect of aerophagia.

## 2018-12-06 DIAGNOSIS — G4733 Obstructive sleep apnea (adult) (pediatric): Secondary | ICD-10-CM | POA: Diagnosis not present

## 2018-12-08 NOTE — Telephone Encounter (Signed)
DL has been printed, and placed in VS look at green folder for review on C POD  VS please advise once review. Thank you

## 2018-12-15 ENCOUNTER — Encounter: Payer: Self-pay | Admitting: Podiatry

## 2018-12-20 NOTE — Telephone Encounter (Signed)
Emailed patient to letting her know order to change auto CPAP to 5 - 10 cm H2O was placed with [facility] today. Placed order to [facility] to change cpap settings Nothing further needed.

## 2018-12-20 NOTE — Telephone Encounter (Signed)
Auto CPAP 11/08/18 to 12/07/18 >> used on 30 of 30 nights with average 7 hrs 2 min.  Average AHI 2.5 with median CPAP 9 and 95 th percentile CPAP 13 cm H2O.   Please send order to change auto CPAP to 5 - 10 cm H2O.  Please inform patient of this change, and that she should call or email back if she continues to have bloating and belching from wearing CPAP.

## 2019-01-06 DIAGNOSIS — G4733 Obstructive sleep apnea (adult) (pediatric): Secondary | ICD-10-CM | POA: Diagnosis not present

## 2019-01-16 ENCOUNTER — Encounter: Payer: Self-pay | Admitting: Internal Medicine

## 2019-01-17 ENCOUNTER — Encounter: Payer: Self-pay | Admitting: Internal Medicine

## 2019-01-17 ENCOUNTER — Ambulatory Visit (INDEPENDENT_AMBULATORY_CARE_PROVIDER_SITE_OTHER): Payer: 59 | Admitting: Internal Medicine

## 2019-01-17 ENCOUNTER — Other Ambulatory Visit: Payer: Self-pay | Admitting: Internal Medicine

## 2019-01-17 ENCOUNTER — Other Ambulatory Visit: Payer: Self-pay

## 2019-01-17 DIAGNOSIS — J0100 Acute maxillary sinusitis, unspecified: Secondary | ICD-10-CM | POA: Diagnosis not present

## 2019-01-17 DIAGNOSIS — J019 Acute sinusitis, unspecified: Secondary | ICD-10-CM | POA: Insufficient documentation

## 2019-01-17 DIAGNOSIS — G4733 Obstructive sleep apnea (adult) (pediatric): Secondary | ICD-10-CM | POA: Diagnosis not present

## 2019-01-17 MED ORDER — CEPHALEXIN 500 MG PO CAPS: 500.0000 mg | ORAL_CAPSULE | Freq: Two times a day (BID) | ORAL | 0 refills | Status: DC

## 2019-01-17 NOTE — Assessment & Plan Note (Signed)
Symptoms consistent with probable bacterial sinus infection She also has allergies Continue over-the-counter allergy medication Continue Sudafed or Advil Cold and Sinus We will start Keflex 500 mg twice daily x10 days Over-the-counter cold medications as needed Rest, fluids Call if no improvement

## 2019-01-17 NOTE — Progress Notes (Signed)
Virtual Visit via Video Note  I connected with [name] on 01/17/19 at  3:30 PM EDT by a video enabled telemedicine application and verified that I am speaking with the correct person using two identifiers.   I discussed the limitations of evaluation and management by telemedicine and the availability of in person appointments. The patient expressed understanding and agreed to proceed.  The patient is currently at home and I am in the office.    No referring provider.    History of Present Illness: This is an acute visit for sinus issues.  She tends to have a sinus infection approximately once a year, typically once the seasonal allergies started in the spring.  She  has had symptoms for over a week.  She has been taking Advil Cold and Sinus and more recently Sudafed.  She states pressure and pain in her left maxillary sinus, pain in the left ear, and jaw pain on the left side and teeth pain.  She has had a lot of drainage and has had a sore throat.  She denies any fevers, chills, headaches, coughing, wheezing and shortness of breath.  The symptoms are typical of her sinus infections that she gets.  Typically she goes to urgent care and she gets a Z-Pak.  She otherwise feels fine and has no concerns.   Social History   Socioeconomic History  . Marital status: Married    Spouse name: Not on file  . Number of children: Not on file  . Years of education: Not on file  . Highest education level: Not on file  Occupational History  . Not on file  Social Needs  . Financial resource strain: Not on file  . Food insecurity:    Worry: Not on file    Inability: Not on file  . Transportation needs:    Medical: Not on file    Non-medical: Not on file  Tobacco Use  . Smoking status: Current Every Day Smoker    Packs/day: 0.30    Years: 30.00    Pack years: 9.00    Types: Cigarettes  . Smokeless tobacco: Never Used  . Tobacco comment: Married, lives with spouse. Cares for  elderly parent and in-laws  Substance and Sexual Activity  . Alcohol use: Yes    Alcohol/week: 2.0 standard drinks    Types: 2 Standard drinks or equivalent per week    Comment: Rare glass of wine  . Drug use: No  . Sexual activity: Yes    Partners: Male    Birth control/protection: Other-see comments, Post-menopausal    Comment: husband had vasectomy  Lifestyle  . Physical activity:    Days per week: Not on file    Minutes per session: Not on file  . Stress: Not on file  Relationships  . Social connections:    Talks on phone: Not on file    Gets together: Not on file    Attends religious service: Not on file    Active member of club or organization: Not on file    Attends meetings of clubs or organizations: Not on file    Relationship status: Not on file  Other Topics Concern  . Not on file  Social History Narrative   Lives with husband in a one story home.  She has 3 grown children.     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     Education: high school.        No regular exercise  Observations/Objective: Appears well in NAD   Assessment and Plan:  See Problem List for Assessment and Plan of chronic medical problems.   Follow Up Instructions:    I discussed the assessment and treatment plan with the patient. The patient was provided an opportunity to ask questions and all were answered. The patient agreed with the plan and demonstrated an understanding of the instructions.   The patient was advised to call back or seek an in-person evaluation if the symptoms worsen or if the condition fails to improve as anticipated.    Christina Rail, MD

## 2019-03-10 DIAGNOSIS — R69 Illness, unspecified: Secondary | ICD-10-CM | POA: Diagnosis not present

## 2019-04-04 ENCOUNTER — Other Ambulatory Visit: Payer: Self-pay

## 2019-04-06 ENCOUNTER — Ambulatory Visit: Payer: 59 | Admitting: Obstetrics and Gynecology

## 2019-04-10 NOTE — Patient Instructions (Addendum)
Tests ordered today. Your results will be released to [facility] (or called to you) after review, usually within 72hours after test completion. If any changes need to be made, you will be notified at that same time.  All other Health Maintenance issues reviewed.   All recommended immunizations and age-appropriate screenings are up-to-date or discussed.  No immunizations administered today.   Medications reviewed and updated.  Changes include :      Please followup in 6 months    Health Maintenance, Female Adopting a healthy lifestyle and getting preventive care are important in promoting health and wellness. Ask your health care provider about:  The right schedule for you to have regular tests and exams.  Things you can do on your own to prevent diseases and keep yourself healthy. What should I know about diet, weight, and exercise? Eat a healthy diet   Eat a diet that includes plenty of vegetables, fruits, low-fat dairy products, and lean protein.  Do not eat a lot of foods that are high in solid fats, added sugars, or sodium. Maintain a healthy weight Body mass index (BMI) is used to identify weight problems. It estimates body fat based on height and weight. Your health care provider can help determine your BMI and help you achieve or maintain a healthy weight. Get regular exercise Get regular exercise. This is one of the most important things you can do for your health. Most adults should:  Exercise for at least 150 minutes each week. The exercise should increase your heart rate and make you sweat (moderate-intensity exercise).  Do strengthening exercises at least twice a week. This is in addition to the moderate-intensity exercise.  Spend less time sitting. Even light physical activity can be beneficial. Watch cholesterol and blood lipids Have your blood tested for lipids and cholesterol at 64 years of age, then have this test every 5 years. Have your cholesterol levels  checked more often if:  Your lipid or cholesterol levels are high.  You are older than 64 years of age.  You are at high risk for heart disease. What should I know about cancer screening? Depending on your health history and family history, you may need to have cancer screening at various ages. This may include screening for:  Breast cancer.  Cervical cancer.  Colorectal cancer.  Skin cancer.  Lung cancer. What should I know about heart disease, diabetes, and high blood pressure? Blood pressure and heart disease  High blood pressure causes heart disease and increases the risk of stroke. This is more likely to develop in people who have high blood pressure readings, are of African descent, or are overweight.  Have your blood pressure checked: ? Every 3-5 years if you are 43-23 years of age. ? Every year if you are 76 years old or older. Diabetes Have regular diabetes screenings. This checks your fasting blood sugar level. Have the screening done:  Once every three years after age 38 if you are at a normal weight and have a low risk for diabetes.  More often and at a younger age if you are overweight or have a high risk for diabetes. What should I know about preventing infection? Hepatitis B If you have a higher risk for hepatitis B, you should be screened for this virus. Talk with your health care provider to find out if you are at risk for hepatitis B infection. Hepatitis C Testing is recommended for:  Everyone born from [birth year] through 1966.  Anyone with known risk  factors for hepatitis C. Sexually transmitted infections (STIs)  Get screened for STIs, including gonorrhea and chlamydia, if: ? You are sexually active and are younger than 65 years of age. ? You are older than 64 years of age and your health care provider tells you that you are at risk for this type of infection. ? Your sexual activity has changed since you were last screened, and you are at increased risk  for chlamydia or gonorrhea. Ask your health care provider if you are at risk.  Ask your health care provider about whether you are at high risk for HIV. Your health care provider may recommend a prescription medicine to help prevent HIV infection. If you choose to take medicine to prevent HIV, you should first get tested for HIV. You should then be tested every 3 months for as long as you are taking the medicine. Pregnancy  If you are about to stop having your period (premenopausal) and you may become pregnant, seek counseling before you get pregnant.  Take 400 to 800 micrograms (mcg) of folic acid every day if you become pregnant.  Ask for birth control (contraception) if you want to prevent pregnancy. Osteoporosis and menopause Osteoporosis is a disease in which the bones lose minerals and strength with aging. This can result in bone fractures. If you are 40 years old or older, or if you are at risk for osteoporosis and fractures, ask your health care provider if you should:  Be screened for bone loss.  Take a calcium or vitamin D supplement to lower your risk of fractures.  Be given hormone replacement therapy (HRT) to treat symptoms of menopause. Follow these instructions at home: Lifestyle  Do not use any products that contain nicotine or tobacco, such as cigarettes, e-cigarettes, and chewing tobacco. If you need help quitting, ask your health care provider.  Do not use street drugs.  Do not share needles.  Ask your health care provider for help if you need support or information about quitting drugs. Alcohol use  Do not drink alcohol if: ? Your health care provider tells you not to drink. ? You are pregnant, may be pregnant, or are planning to become pregnant.  If you drink alcohol: ? Limit how much you use to 0-1 drink a day. ? Limit intake if you are breastfeeding.  Be aware of how much alcohol is in your drink. In the [place] one drink equals one 12 oz bottle of beer (355  mL), one 5 oz glass of wine (148 mL), or one 1 oz glass of hard liquor (44 mL). General instructions  Schedule regular health, dental, and eye exams.  Stay current with your vaccines.  Tell your health care provider if: ? You often feel depressed. ? You have ever been abused or do not feel safe at home. Summary  Adopting a healthy lifestyle and getting preventive care are important in promoting health and wellness.  Follow your health care provider's instructions about healthy diet, exercising, and getting tested or screened for diseases.  Follow your health care provider's instructions on monitoring your cholesterol and blood pressure. This information is not intended to replace advice given to you by your health care provider. Make sure you discuss any questions you have with your health care provider. Document Released: 04/07/2011 Document Revised: 09/15/2018 Document Reviewed: 09/15/2018 Elsevier Patient Education  2020 [employer].

## 2019-04-10 NOTE — Progress Notes (Signed)
Subjective:    Patient ID: Christina Buchanan, female    DOB: 07/30/1956, 63 y.o.   MRN: [id number]  HPI She is here for a physical exam.   She denies changes in her health since she was here last.  She has no concerns.   Medications and allergies reviewed with patient and updated if appropriate.  Patient Active Problem List   Diagnosis Date Noted  . Hair changes 10/08/2018  . Genetic testing 06/09/2018  . Family history of breast cancer   . Diarrhea 04/05/2018  . OSA (obstructive sleep apnea) 12/15/2017  . Wears hearing aid in both ears 09/08/2017  . Hemorrhoids 09/08/2017  . Status post total abdominal hysterectomy and bilateral salpingo-oophorectomy 10/02/2016  . Diabetes ([place]) 08/12/2016  . Hyperlipidemia 04/29/2016  . Estrogen deficiency 04/29/2016  . Vitamin D deficiency 01/29/2010  . Tobacco abuse 01/21/2010  . Depression 01/21/2010  . ALLERGIC RHINITIS 01/21/2010  . Personal history of colonic polyps 07/30/2006    Current Outpatient Medications on File Prior to Visit  Medication Sig Dispense Refill  . azelastine (ASTELIN) 0.1 % nasal spray USE ONE SPRAY IN EACH NOSTRIL DAILY AS NEEDED 30 mL 0  . cetirizine (ZYRTEC) 10 MG tablet Take 10 mg by mouth daily as needed for allergies.     [name] escitalopram (LEXAPRO) 10 MG tablet TAKE 1 TABLET BY MOUTH EVERY DAY 90 tablet 3  . Omega-3 Fatty Acids (OMEGA 3 PO) Take 2.5 g by mouth.    . saxagliptin HCl (ONGLYZA) 5 MG TABS tablet Take 1 tablet by mouth daily. 90 tablet 1  . simvastatin (ZOCOR) 20 MG tablet TAKE 1 TABLET (20 MG TOTAL) BY MOUTH DAILY AT 6 PM. 90 tablet 3  . VIBERZI 100 MG TABS TAKE 1 TABLET BY MOUTH TWICE A DAY 60 tablet 2   No current facility-administered medications on file prior to visit.     Past Medical History:  Diagnosis Date  . ALLERGIC RHINITIS   . Arthritis    hands  . DEPRESSION   . Diabetes mellitus without complication ([place]) 3474   diet controlled  . Family history of breast cancer   .  Fibroid, uterine 04/29/2016  . HYPERLIPIDEMIA   . OSA (obstructive sleep apnea) 12/15/2017  . Osteopenia   . Personal history of colonic polyps   . SMOKER   . VITAMIN D DEFICIENCY     Past Surgical History:  Procedure Laterality Date  . ABDOMINAL HYSTERECTOMY N/A 10/02/2016   Procedure: HYSTERECTOMY ABDOMINAL;  Surgeon: Nunzio Cobbs, MD;  Location: [facility];  Service: Gynecology;  Laterality: N/A;  4 hours  . ABDOMINAL SACROCOLPOPEXY N/A 10/02/2016   Procedure: ABDOMINO SACROCOLPOPEXY;  Surgeon: Nunzio Cobbs, MD;  Location: [facility];  Service: Gynecology;  Laterality: N/A;  . ANTERIOR AND POSTERIOR REPAIR N/A 10/02/2016   Procedure: ANTERIOR (CYSTOCELE) AND POSTERIOR REPAIR (RECTOCELE);  Surgeon: Nunzio Cobbs, MD;  Location: [facility];  Service: Gynecology;  Laterality: N/A;  . BLADDER SUSPENSION N/A 10/02/2016   Procedure: TRANSVAGINAL TAPE (TVT) PROCEDURE exact midurethral sling;  Surgeon: Nunzio Cobbs, MD;  Location: [facility];  Service: Gynecology;  Laterality: N/A;  . BREAST SURGERY  1990   breast biopsy  . COLONOSCOPY W/ BIOPSIES  9/07   2 polyps & tubuvillous adenoma  . COLONOSCOPY W/ BIOPSIES  11/13/08   sigmoid ddiverticuli and rechek in 5 years - Dr. Collene Mares  . CYSTOSCOPY N/A 10/02/2016   Procedure: CYSTOSCOPY;  Surgeon: Nunzio Cobbs, MD;  Location: [facility];  Service: Gynecology;  Laterality: N/A;  . ENDOMETRIAL ABLATION  08/21/03   HTA  . 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 SALPINGOOPHORECTOMY Bilateral 10/02/2016   Procedure: SALPINGO OOPHORECTOMY;  Surgeon: Nunzio Cobbs, MD;  Location: [facility];  Service: Gynecology;  Laterality: Bilateral;  . UPPER GASTROINTESTINAL ENDOSCOPY  06/15/06   2 polyps - H pylori    Social History   Socioeconomic History  . Marital status: Married    Spouse name: Not on file  . Number of children: Not on file  . Years of  education: Not on file  . Highest education level: Not on file  Occupational History  . Not on file  Social Needs  . Financial resource strain: Not on file  . Food insecurity    Worry: Not on file    Inability: Not on file  . Transportation needs    Medical: Not on file    Non-medical: Not on file  Tobacco Use  . Smoking status: Current Every Day Smoker    Packs/day: 0.30    Years: 30.00    Pack years: 9.00    Types: Cigarettes  . Smokeless tobacco: Never Used  . Tobacco comment: Married, lives with spouse. Cares for elderly parent and in-laws  Substance and Sexual Activity  . Alcohol use: Yes    Alcohol/week: 2.0 standard drinks    Types: 2 Standard drinks or equivalent per week    Comment: Rare glass of wine  . Drug use: No  . Sexual activity: Yes    Partners: Male    Birth control/protection: Other-see comments, Post-menopausal    Comment: husband had vasectomy  Lifestyle  . Physical activity    Days per week: Not on file    Minutes per session: Not on file  . Stress: Not on file  Relationships  . Social Herbalist on phone: Not on file    Gets together: Not on file    Attends religious service: Not on file    Active member of club or organization: Not on file    Attends meetings of clubs or organizations: Not on file    Relationship status: Not on file  Other Topics Concern  . Not on file  Social History Narrative   Lives with husband in a one story home.  She has 3 grown children.     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     Education: high school.        No regular exercise    Family History  Problem Relation Age of Onset  . Breast cancer Mother 37  . Diabetes Mother   . Stroke Mother               . Hyperlipidemia Mother   . Heart failure Mother   . Hypertension Mother   . COPD Mother   . Hypertension Father   . Heart disease Father   . Hyperlipidemia Father   . Heart failure Father        dec at age 60  . Breast cancer Sister 4  . Heart  disease Sister        Afib  . Psoriasis Son     Review of Systems  Constitutional: Negative for chills and fever.  Eyes: Negative for visual disturbance.  Respiratory: Negative for cough, shortness of breath and wheezing.  Cardiovascular: Negative for chest pain, palpitations and leg swelling.  Gastrointestinal: Negative for abdominal pain, blood in stool, constipation, diarrhea and nausea.       No gerd  Genitourinary: Negative for dysuria and hematuria.  Musculoskeletal: Positive for arthralgias.  Skin: Negative for color change and rash.  Neurological: Negative for dizziness, light-headedness, numbness and headaches.  Psychiatric/Behavioral: Positive for dysphoric mood. The patient is not nervous/anxious.        Objective:   Vitals:   04/11/19 0848  BP: 134/82  Pulse: 64  Resp: 16  Temp: 98.5 F (36.9 C)  SpO2: 95%   Filed Weights   04/11/19 0848  Weight: 158 lb (71.7 kg)   Body mass index is 27.12 kg/m.  BP Readings from Last 3 Encounters:  04/11/19 134/82  10/08/18 132/78  08/18/18 114/69    Wt Readings from Last 3 Encounters:  04/11/19 158 lb (71.7 kg)  10/08/18 162 lb (73.5 kg)  06/04/18 157 lb (71.2 kg)     Physical Exam Constitutional: She appears well-developed and well-nourished. No distress.  HENT:  Head: Normocephalic and atraumatic.  Right Ear: External ear normal. Normal ear canal with moderate cerumen, TM not visualized Left Ear: External ear normal.  Normal ear canal with mild cerumen, TM not visualized Mouth/Throat: Oropharynx is clear and moist.  Eyes: Conjunctivae and EOM are normal.  Neck: Neck supple. No tracheal deviation present. No thyromegaly present. No carotid bruit  Cardiovascular: Normal rate, regular rhythm and normal heart sounds.  No murmur heard.  No edema. Pulmonary/Chest: Effort normal and breath sounds normal. No respiratory distress. She has no wheezes. She has no rales.  Breast: deferred   Abdominal: Soft. She  exhibits no distension. There is no tenderness.  Lymphadenopathy: She has no cervical adenopathy.  Skin: Skin is warm and dry. She is not diaphoretic.  Psychiatric: She has a normal mood and affect. Her behavior is normal.    Diabetic Foot Exam - Simple   Simple Foot Form Diabetic Foot exam was performed with the following findings: Yes 04/11/2019  9:24 AM  Visual Inspection No deformities, no ulcerations, no other skin breakdown bilaterally: Yes Sensation Testing Intact to touch and monofilament testing bilaterally: Yes Pulse Check Posterior Tibialis and Dorsalis pulse intact bilaterally: Yes Comments         Assessment & Plan:   Physical exam: Screening blood work ordered Immunizations  shingrix discussed, others up to date Colonoscopy   Up to date  Mammogram    Up to date  Gyn   Up to date - Dr Quincy Simmonds Dexa   Up to date - done last year Eye exams  Not up to date Exercise    walking Weight  Working on weight loss Skin      no concerns Substance abuse   smoking  See Problem List for Assessment and Plan of chronic medical problems.    FU in 73months

## 2019-04-11 ENCOUNTER — Other Ambulatory Visit: Payer: Self-pay

## 2019-04-11 ENCOUNTER — Other Ambulatory Visit (INDEPENDENT_AMBULATORY_CARE_PROVIDER_SITE_OTHER): Payer: 59

## 2019-04-11 ENCOUNTER — Encounter: Payer: Self-pay | Admitting: Internal Medicine

## 2019-04-11 ENCOUNTER — Ambulatory Visit (INDEPENDENT_AMBULATORY_CARE_PROVIDER_SITE_OTHER): Payer: 59 | Admitting: Internal Medicine

## 2019-04-11 ENCOUNTER — Other Ambulatory Visit: Payer: Self-pay | Admitting: Internal Medicine

## 2019-04-11 VITALS — BP 134/82 | HR 64 | Temp 98.5°F | Resp 16 | Ht 64.0 in | Wt 158.0 lb

## 2019-04-11 DIAGNOSIS — Z0000 Encounter for general adult medical examination without abnormal findings: Secondary | ICD-10-CM

## 2019-04-11 DIAGNOSIS — E782 Mixed hyperlipidemia: Secondary | ICD-10-CM

## 2019-04-11 DIAGNOSIS — F3289 Other specified depressive episodes: Secondary | ICD-10-CM

## 2019-04-11 DIAGNOSIS — G4733 Obstructive sleep apnea (adult) (pediatric): Secondary | ICD-10-CM

## 2019-04-11 DIAGNOSIS — E119 Type 2 diabetes mellitus without complications: Secondary | ICD-10-CM

## 2019-04-11 DIAGNOSIS — R197 Diarrhea, unspecified: Secondary | ICD-10-CM

## 2019-04-11 LAB — CBC WITH DIFFERENTIAL/PLATELET
Basophils Absolute: 0 10*3/uL (ref 0.0–0.1)
Basophils Relative: 0.6 % (ref 0.0–3.0)
Eosinophils Absolute: 0.3 10*3/uL (ref 0.0–0.7)
Eosinophils Relative: 3.7 % (ref 0.0–5.0)
HCT: 43.3 % (ref 36.0–46.0)
Hemoglobin: 14.7 g/dL (ref 12.0–15.0)
Lymphocytes Relative: 28.5 % (ref 12.0–46.0)
Lymphs Abs: 2.2 10*3/uL (ref 0.7–4.0)
MCHC: 33.9 g/dL (ref 30.0–36.0)
MCV: 94 fl (ref 78.0–100.0)
Monocytes Absolute: 0.6 10*3/uL (ref 0.1–1.0)
Monocytes Relative: 8.2 % (ref 3.0–12.0)
Neutro Abs: 4.6 10*3/uL (ref 1.4–7.7)
Neutrophils Relative %: 59 % (ref 43.0–77.0)
Platelets: 249 10*3/uL (ref 150.0–400.0)
RBC: 4.6 Mil/uL (ref 3.87–5.11)
RDW: 12.6 % (ref 11.5–15.5)
WBC: 7.8 10*3/uL (ref 4.0–10.5)

## 2019-04-11 LAB — COMPREHENSIVE METABOLIC PANEL
ALT: 24 U/L (ref 0–35)
AST: 20 U/L (ref 0–37)
Albumin: 4.5 g/dL (ref 3.5–5.2)
Alkaline Phosphatase: 61 U/L (ref 39–117)
BUN: 19 mg/dL (ref 6–23)
CO2: 23 mEq/L (ref 19–32)
Calcium: 8.9 mg/dL (ref 8.4–10.5)
Chloride: 107 mEq/L (ref 96–112)
Creatinine, Ser: 0.65 mg/dL (ref 0.40–1.20)
GFR: 91.71 mL/min (ref >60.00)
Glucose, Bld: 122 mg/dL — ABNORMAL HIGH (ref 70–99)
Potassium: 3.9 mEq/L (ref 3.5–5.1)
Sodium: 141 mEq/L (ref 135–145)
Total Bilirubin: 0.5 mg/dL (ref 0.2–1.2)
Total Protein: 7.2 g/dL (ref 6.0–8.3)

## 2019-04-11 LAB — LIPID PANEL
Cholesterol: 122 mg/dL (ref 0–200)
HDL: 40.7 mg/dL (ref >39.00)
LDL Cholesterol: 61 mg/dL (ref 0–99)
NonHDL: 81.49
Total CHOL/HDL Ratio: 3
Triglycerides: 103 mg/dL (ref 0.0–149.0)
VLDL: 20.6 mg/dL (ref 0.0–40.0)

## 2019-04-11 LAB — HEMOGLOBIN A1C: Hgb A1c MFr Bld: 6.4 % (ref 4.6–6.5)

## 2019-04-11 LAB — MICROALBUMIN / CREATININE URINE RATIO
Creatinine,U: 138.6 mg/dL
Microalb Creat Ratio: 2.1 mg/g (ref 0.0–30.0)
Microalb, Ur: 2.9 mg/dL — ABNORMAL HIGH (ref 0.0–1.9)

## 2019-04-11 LAB — TSH: TSH: 1.55 u[IU]/mL (ref 0.35–4.50)

## 2019-04-11 MED ORDER — ASPIRIN EC 81 MG PO TBEC: 81.0000 mg | DELAYED_RELEASE_TABLET | Freq: Every day | ORAL | Status: AC

## 2019-04-11 MED ORDER — OZEMPIC (0.25 OR 0.5 MG/DOSE) 2 MG/1.5ML ~~LOC~~ SOPN: 0.5000 mg | PEN_INJECTOR | SUBCUTANEOUS | Status: DC

## 2019-04-11 NOTE — Assessment & Plan Note (Signed)
Controlled, stable Continue current dose of medication  

## 2019-04-11 NOTE — Assessment & Plan Note (Signed)
Resolved with viberzi - continue

## 2019-04-11 NOTE — Assessment & Plan Note (Signed)
Check a1c, urine micro Will schedule eye appt Continue low sugar / carb diet Continue regular exercise

## 2019-04-11 NOTE — Assessment & Plan Note (Signed)
Using cpap

## 2019-04-11 NOTE — Assessment & Plan Note (Addendum)
Check lipid panel,cmp ,tsh Continue daily statin Regular exercise and healthy diet encouraged  

## 2019-04-12 ENCOUNTER — Encounter: Payer: Self-pay | Admitting: Internal Medicine

## 2019-04-13 ENCOUNTER — Other Ambulatory Visit: Payer: Self-pay | Admitting: Internal Medicine

## 2019-04-13 MED ORDER — OZEMPIC (1 MG/DOSE) 2 MG/1.5ML ~~LOC~~ SOPN: 1.0000 mg | PEN_INJECTOR | SUBCUTANEOUS | 1 refills | Status: DC

## 2019-04-29 ENCOUNTER — Other Ambulatory Visit: Payer: Self-pay | Admitting: Internal Medicine

## 2019-05-07 ENCOUNTER — Other Ambulatory Visit: Payer: Self-pay | Admitting: Internal Medicine

## 2019-05-13 ENCOUNTER — Other Ambulatory Visit: Payer: Self-pay | Admitting: Internal Medicine

## 2019-05-13 ENCOUNTER — Other Ambulatory Visit: Payer: Self-pay

## 2019-05-13 NOTE — Telephone Encounter (Signed)
Medication refill request: Escitalopram 10 mg #90 Last AEX:  04-02-18 Next AEX: 06-07-19 Last MMG (if hormonal medication request): 08-30-18 Neg/BiRads1 Refill authorized: Please advise

## 2019-05-15 MED ORDER — ESCITALOPRAM OXALATE 10 MG PO TABS: 10.0000 mg | ORAL_TABLET | Freq: Every day | ORAL | 0 refills | Status: DC

## 2019-05-23 ENCOUNTER — Other Ambulatory Visit: Payer: Self-pay

## 2019-05-23 DIAGNOSIS — Z20822 Contact with and (suspected) exposure to covid-19: Secondary | ICD-10-CM

## 2019-05-25 LAB — NOVEL CORONAVIRUS, NAA: SARS-CoV-2, NAA: NOT DETECTED

## 2019-06-04 ENCOUNTER — Other Ambulatory Visit: Payer: Self-pay | Admitting: Internal Medicine

## 2019-06-07 ENCOUNTER — Ambulatory Visit: Payer: 59 | Admitting: Obstetrics and Gynecology

## 2019-06-26 ENCOUNTER — Other Ambulatory Visit: Payer: Self-pay | Admitting: Obstetrics and Gynecology

## 2019-06-27 NOTE — Telephone Encounter (Signed)
Medication refill request: Lexapro Last AEX:  04/02/2018 BS Next AEX: 07/05/2019 Last MMG (if hormonal medication request): 08/30/2018 BIRADS 1 Negative Density C Refill authorized: Pending #30 with no refills if appropriate. Please advise.

## 2019-07-05 ENCOUNTER — Ambulatory Visit: Payer: 59 | Admitting: Obstetrics and Gynecology

## 2019-07-05 ENCOUNTER — Encounter: Payer: Self-pay | Admitting: Obstetrics and Gynecology

## 2019-07-05 ENCOUNTER — Other Ambulatory Visit: Payer: Self-pay

## 2019-07-05 VITALS — BP 142/78 | HR 66 | Temp 97.9°F | Resp 16 | Ht 63.25 in | Wt 158.6 lb

## 2019-07-05 DIAGNOSIS — Z01419 Encounter for gynecological examination (general) (routine) without abnormal findings: Secondary | ICD-10-CM

## 2019-07-05 DIAGNOSIS — N763 Subacute and chronic vulvitis: Secondary | ICD-10-CM

## 2019-07-05 DIAGNOSIS — N6452 Nipple discharge: Secondary | ICD-10-CM | POA: Diagnosis not present

## 2019-07-05 MED ORDER — HYDROCORTISONE (PERIANAL) 2.5 % EX CREA: 1.0000 "application " | TOPICAL_CREAM | Freq: Two times a day (BID) | CUTANEOUS | 1 refills | Status: DC

## 2019-07-05 MED ORDER — NYSTATIN 100000 UNIT/GM EX CREA: 1.0000 "application " | TOPICAL_CREAM | Freq: Two times a day (BID) | CUTANEOUS | 1 refills | Status: DC

## 2019-07-05 MED ORDER — BUPROPION HCL ER (XL) 150 MG PO TB24: 150.0000 mg | ORAL_TABLET | Freq: Every day | ORAL | 0 refills | Status: DC

## 2019-07-05 MED ORDER — TRIAMCINOLONE ACETONIDE 0.025 % EX OINT: 1.0000 "application " | TOPICAL_OINTMENT | Freq: Two times a day (BID) | CUTANEOUS | 1 refills | Status: DC

## 2019-07-05 NOTE — Progress Notes (Signed)
63 y.o. G29P3003 Married Caucasian female here for annual exam.    She would like help stop smoking.  She used Wellbutrin in the past, and she did not want to smoke when she took it.  Started Lexapro when she was going through menopause and had an emotional time, which was very short lived.  She is still on the Lexapro but questions if she still needs it.   States her bladder is doing great.   Saw her PCP about diarrhea.  She was treated with Vibrezi, which worked well.   Her A1C is 6.4.   Has vulvar itching and uses Triamcinolone and Nystatin cream.  Does not want to wear her jeans.   2 sons had Covd 51.   PCP: Billey Gosling, MD    Patient's last menstrual period was 03/06/2006 (approximate).           Sexually active: Yes.    The current method of family planning is status post hysterectomy.    Exercising: No.  The patient does not participate in regular exercise at present. Smoker:  Yes, 4 cigarettes/day  Health Maintenance: Pap: 02-17-16 Neg, 04-23-15 Neg:Neg HR HPV History of abnormal Pap:  Unsure--she had PMB MMG: 08-30-18 3D/Neg/density C/BiRads1 Colonoscopy: 09/24/2015, Tubular Adenoma, repeat in 5 years BMD:  04-09-18 Result : Osteopenia TDaP: 02-17-12 Gardasil:   n/a HIV:04-29-16 Neg Hep C:10-04-15 Neg Screening Labs:  PCP.    reports that she has been smoking cigarettes. She has a 9.00 pack-year smoking history. She has never used smokeless tobacco. She reports current alcohol use of about 2.0 standard drinks of alcohol per week. She reports that she does not use drugs.  Past Medical History:  Diagnosis Date  . ALLERGIC RHINITIS   . Arthritis    hands  . DEPRESSION   . Diabetes mellitus without complication ([place]) 123456   diet controlled  . Family history of breast cancer   . Fibroid, uterine 04/29/2016  . HYPERLIPIDEMIA   . OSA (obstructive sleep apnea) 12/15/2017  . Osteopenia   . Personal history of colonic polyps   . SMOKER   . VITAMIN D DEFICIENCY      Past Surgical History:  Procedure Laterality Date  . ABDOMINAL HYSTERECTOMY N/A 10/02/2016   Procedure: HYSTERECTOMY ABDOMINAL;  Surgeon: Nunzio Cobbs, MD;  Location: [facility];  Service: Gynecology;  Laterality: N/A;  4 hours  . ABDOMINAL SACROCOLPOPEXY N/A 10/02/2016   Procedure: ABDOMINO SACROCOLPOPEXY;  Surgeon: Nunzio Cobbs, MD;  Location: [facility];  Service: Gynecology;  Laterality: N/A;  . ANTERIOR AND POSTERIOR REPAIR N/A 10/02/2016   Procedure: ANTERIOR (CYSTOCELE) AND POSTERIOR REPAIR (RECTOCELE);  Surgeon: Nunzio Cobbs, MD;  Location: [facility];  Service: Gynecology;  Laterality: N/A;  . BLADDER SUSPENSION N/A 10/02/2016   Procedure: TRANSVAGINAL TAPE (TVT) PROCEDURE exact midurethral sling;  Surgeon: Nunzio Cobbs, MD;  Location: [facility];  Service: Gynecology;  Laterality: N/A;  . BREAST SURGERY  1990   breast biopsy  . COLONOSCOPY W/ BIOPSIES  9/07   2 polyps & tubuvillous adenoma  . COLONOSCOPY W/ BIOPSIES  11/13/08   sigmoid ddiverticuli and rechek in 5 years - Dr. Collene Mares  . CYSTOSCOPY N/A 10/02/2016   Procedure: CYSTOSCOPY;  Surgeon: Nunzio Cobbs, MD;  Location: [facility];  Service: Gynecology;  Laterality: N/A;  . ENDOMETRIAL ABLATION  08/21/03   HTA  . RIGHT COLECTOMY  07/30/06   partial colectomy for tubulovillous adenoma w/o  high grade dysplasia  . SALPINGOOPHORECTOMY Bilateral 10/02/2016   Procedure: SALPINGO OOPHORECTOMY;  Surgeon: Nunzio Cobbs, MD;  Location: [facility];  Service: Gynecology;  Laterality: Bilateral;  . UPPER GASTROINTESTINAL ENDOSCOPY  06/15/06   2 polyps - H pylori    Current Outpatient Medications  Medication Sig Dispense Refill  . aspirin EC 81 MG tablet Take 1 tablet (81 mg total) by mouth daily.    [name] azelastine (ASTELIN) 0.1 % nasal spray USE ONE SPRAY IN EACH NOSTRIL DAILY AS NEEDED 30 mL 0  . cetirizine (ZYRTEC) 10 MG tablet Take 10 mg by mouth daily as needed for allergies.     [name]  escitalopram (LEXAPRO) 10 MG tablet TAKE 1 TABLET BY MOUTH EVERY DAY 30 tablet 0  . hydrocortisone (PROCTOSOL HC) 2.5 % rectal cream Place 1 application rectally 2 (two) times daily.    [name] nystatin cream (MYCOSTATIN) Apply 1 application topically 2 (two) times daily.    . simvastatin (ZOCOR) 20 MG tablet TAKE 1 TABLET (20 MG TOTAL) BY MOUTH DAILY AT 6 PM. 90 tablet 3  . triamcinolone (KENALOG) 0.025 % ointment Apply 1 application topically 2 (two) times daily.    [name] VIBERZI 100 MG TABS TAKE 1 TABLET TWICE A DAY 60 tablet 2   No current facility-administered medications for this visit.     Family History  Problem Relation Age of Onset  . Breast cancer Mother 91  . Diabetes Mother   . Stroke Mother               . Hyperlipidemia Mother   . Heart failure Mother   . Hypertension Mother   . COPD Mother   . Hypertension Father   . Heart disease Father   . Hyperlipidemia Father   . Heart failure Father        dec at age 61  . Breast cancer Sister 8  . Heart disease Sister        Afib  . Psoriasis Son     Review of Systems  All other systems reviewed and are negative.   Exam:   BP (!) 142/78   Pulse 66   Temp 97.9 F (36.6 C) (Temporal)   Resp 16   Ht 5' 3.25" (1.607 m)   Wt 158 lb 9.6 oz (71.9 kg)   LMP 03/06/2006 (Approximate)   BMI 27.87 kg/m     General appearance: alert, cooperative and appears stated age Head: normocephalic, without obvious abnormality, atraumatic Neck: no adenopathy, supple, symmetrical, trachea midline and thyroid normal to inspection and palpation Lungs: clear to auscultation bilaterally Breasts: right - normal appearance, no masses or tenderness, No nipple retraction or dimpling, No nipple discharge or bleeding, No axillary adenopathy Left - normal appearance, no masses or tenderness, No nipple retraction or dimpling.  Clear, yellow nipple discharge noted.  No axillary adenopathy.  Heart: regular rate and rhythm Abdomen: soft, non-tender; no  masses, no organomegaly Extremities: extremities normal, atraumatic, no cyanosis or edema Skin: skin color, texture, turgor normal. No rashes or lesions Lymph nodes: cervical, supraclavicular, and axillary nodes normal. Neurologic: grossly normal  Pelvic: External genitalia:  Pale vulvar skin along the superior labia minora bilaterally.               No abnormal inguinal nodes palpated.              Urethra:  normal appearing urethra with no masses, tenderness or lesions  Bartholins and Skenes: normal                 Vagina: normal appearing vagina with normal color and discharge, no lesions.  Good support.               Cervix: absent.               Pap taken: No. Bimanual Exam:  Uterus:  absent              Adnexa: no mass, fullness, tenderness              Rectal exam: Yes.  .  Confirms.              Anus:  normal sphincter tone, no lesions  Chaperone was present for exam.  Assessment:   Well woman visit with normal exam. S/P TAH with BSO, abdominal sacral colpopexy, AP colporrhaphy, TVT mid urethral sling and cysto 10/02/16 FH breast cancer mother and sister. Negative genetic testing.  Had VUS's. Left nipple discharge. Clear. 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 DM.   Smoker.  Vit D deficiency.  Chronic diarrhea. Tobacco use.    Plan: Mammogram dx left and left breast US.  Will check TSH and prolactin levels. Self breast awareness reviewed. Pap and HR HPV not performed.  Guidelines for Calcium, Vitamin D, regular exercise program including cardiovascular and weight bearing exercise. Wean off Lexapro by reducing to 5 mg daily for 2 weeks and then stop. She will start now Wellbutrin XL 150 mg.  She will let me know how she is doing before her 3 month prescription ends. Refill of Nystatin, Triamcinolone, and Proctosol. Return for vulvar biopsy.  General labs with PCP.  BMD in 2021. Flu vaccine recommended. Follow up annually and prn.   After visit  summary provided.

## 2019-07-05 NOTE — Patient Instructions (Signed)

## 2019-07-06 ENCOUNTER — Other Ambulatory Visit: Payer: Self-pay | Admitting: Obstetrics and Gynecology

## 2019-07-06 ENCOUNTER — Telehealth: Payer: Self-pay | Admitting: Obstetrics and Gynecology

## 2019-07-06 DIAGNOSIS — N6452 Nipple discharge: Secondary | ICD-10-CM

## 2019-07-06 LAB — TSH: TSH: 0.894 u[IU]/mL (ref 0.450–4.500)

## 2019-07-06 LAB — PROLACTIN: Prolactin: 5.2 ng/mL (ref 4.8–23.3)

## 2019-07-06 NOTE — Telephone Encounter (Signed)
Please contact patient to schedule a diagnostic left mammogram and left breast ultrasound.   She had clear, light yellow nipple discharge expressed when I did her routine breast exam yesterday.   [facility]

## 2019-07-06 NOTE — Telephone Encounter (Signed)
Message left to return call to Jennfier Abdulla at [phone].    

## 2019-07-06 NOTE — Telephone Encounter (Signed)
Call to patient. Patient updated of scheduled diagnostic MMG on 07-07-2019 at [place] with 0820 arrival. Patient agreeable to date and time of appointment.   Routing to provider and will close encounter.

## 2019-07-06 NOTE — Telephone Encounter (Signed)
Spoke with Lilia Pro at [facility]. Patient scheduled for diagnostic left mammogram and left breast ultrasound on 07-07-2019 at [place]. RN advised would update patient.

## 2019-07-07 ENCOUNTER — Ambulatory Visit
Admission: RE | Admit: 2019-07-07 | Discharge: 2019-07-07 | Disposition: A | Discharge status: Home / Self Care | Payer: 59 | Source: Ambulatory Visit | Attending: Obstetrics and Gynecology | Admitting: Obstetrics and Gynecology

## 2019-07-07 ENCOUNTER — Other Ambulatory Visit: Payer: Self-pay | Admitting: Obstetrics and Gynecology

## 2019-07-07 ENCOUNTER — Other Ambulatory Visit: Payer: Self-pay

## 2019-07-07 DIAGNOSIS — N6452 Nipple discharge: Secondary | ICD-10-CM

## 2019-07-11 ENCOUNTER — Ambulatory Visit
Admission: RE | Admit: 2019-07-11 | Discharge: 2019-07-11 | Disposition: A | Discharge status: Home / Self Care | Payer: 59 | Source: Ambulatory Visit | Attending: Obstetrics and Gynecology | Admitting: Obstetrics and Gynecology

## 2019-07-11 ENCOUNTER — Other Ambulatory Visit: Payer: Self-pay

## 2019-07-11 DIAGNOSIS — N6452 Nipple discharge: Secondary | ICD-10-CM

## 2019-07-12 ENCOUNTER — Telehealth: Payer: Self-pay | Admitting: Obstetrics and Gynecology

## 2019-07-12 ENCOUNTER — Other Ambulatory Visit: Payer: 59

## 2019-07-12 NOTE — Telephone Encounter (Signed)
Call placed to convey benefits for vulva biopsy. Patient states she is going to have breast surgery. She does not want to have this procedure done. Please advise if okay to cancel the order.

## 2019-07-12 NOTE — Telephone Encounter (Signed)
Call to patient. Patient states that the vulvar biopsy was "an elective thing." Does not wish to proceed with vulvar biopsy at this time. RN advised would update Dr. Quincy Simmonds. Patient agreeable.   Routing to Dr. Quincy Simmonds to review and advise on vulvar biopsy order.

## 2019-07-12 NOTE — Telephone Encounter (Signed)
Ok to cancel order for vulvar biopsy.  She can return to have this done if her vulvar irritation persists.

## 2019-07-13 NOTE — Telephone Encounter (Signed)
Order canceled for vulvar biopsy.   Routing to provider and will close encounter.    [place]

## 2019-07-24 ENCOUNTER — Other Ambulatory Visit: Payer: Self-pay | Admitting: Obstetrics and Gynecology

## 2019-08-03 ENCOUNTER — Telehealth: Payer: Self-pay | Admitting: Obstetrics and Gynecology

## 2019-08-03 ENCOUNTER — Encounter: Payer: Self-pay | Admitting: Obstetrics and Gynecology

## 2019-08-03 ENCOUNTER — Other Ambulatory Visit: Payer: Self-pay | Admitting: Surgery

## 2019-08-03 DIAGNOSIS — D242 Benign neoplasm of left breast: Secondary | ICD-10-CM

## 2019-08-03 NOTE — Telephone Encounter (Signed)
Call to patient. Patient states the wellbutrin has helped her almost completely stop smoking, but asking if she can go back on the lexapro as it seemed to help with her mood. Patient asking if there is anyway she can take them together? RN advised would need to review with Dr. Quincy Simmonds. Patient agreeable- states she would be willing to stop the wellbutrin to get the lexapro back. [facility] [place] on [address].   Routing to provider for review.

## 2019-08-03 NOTE — Telephone Encounter (Signed)
Patient sent the following message through [place]. Routing to triage to assist patient with request.  Christina Buchanan, Christina Buchanan  Phone Number: [phone]        Dr.Silva,  The last time I was in your office, we decided to try the [facility] X, and not take the Lexapro.  I have been off the Lexapro for maybe a month. I have hot flashes again, moody... Can I add the lexapro back in?  It really was my happy drug!!!   Just let me know your thoughts.  Christina Buchanan

## 2019-08-03 NOTE — Telephone Encounter (Signed)
She can go back on the Lexapro 10 mg daily and also take the Wellbutrin XL.  [place] for refills of the Lexapro until annual exam is due.  Hopefully, she can stop the Wellbutrin XL in 2 or 3 months.

## 2019-08-04 ENCOUNTER — Other Ambulatory Visit: Payer: Self-pay | Admitting: Internal Medicine

## 2019-08-04 MED ORDER — VIBERZI 100 MG PO TABS: 1.0000 | ORAL_TABLET | Freq: Two times a day (BID) | ORAL | 1 refills | Status: DC

## 2019-08-04 MED ORDER — ESCITALOPRAM OXALATE 10 MG PO TABS: 10.0000 mg | ORAL_TABLET | Freq: Every day | ORAL | 3 refills | Status: DC

## 2019-08-04 MED ORDER — VIBERZI 100 MG PO TABS: 1.0000 | ORAL_TABLET | Freq: Two times a day (BID) | ORAL | 3 refills | Status: DC

## 2019-08-04 NOTE — Telephone Encounter (Signed)
Not able to send.. pls send electronically.Marland KitchenJohny Buchanan

## 2019-08-04 NOTE — Telephone Encounter (Signed)
Returned call to pt. Pt instructed on taking Lexapro and Wellbutrin together.Pt states plans to come off Wellbutrin when Rx out.   Pt verbalized understanding.   Will order Lexapro 10mg  daily #[facility]   Will route to Dr Quincy Simmonds for review. Will close encounter.

## 2019-08-04 NOTE — Telephone Encounter (Signed)
MD resent electronically.Marland KitchenJohny Buchanan

## 2019-08-05 ENCOUNTER — Other Ambulatory Visit: Payer: Self-pay | Admitting: Obstetrics and Gynecology

## 2019-08-05 ENCOUNTER — Encounter: Payer: Self-pay | Admitting: Obstetrics and Gynecology

## 2019-08-05 NOTE — Telephone Encounter (Signed)
Medication refill request: Proctosol HC Last AEX:  07/05/2019 BS Next AEX: 08/01/2020 Last MMG (if hormonal medication request): 08/30/2018 BIRADS 1 Negative Density C Refill authorized: Pending 30g with no refills if appropriate. Please advise.

## 2019-08-05 NOTE — Telephone Encounter (Signed)
Patient sent the following correspondence through [place].  I need to see if yo can call in a refill on my HYDROCORTISON - E AC 25 MG SUPP.   The previous prescription I never refilled it.  It expired on 09/08/18. Thank you so much. Christina Buchanan [phone] April 29, 1956

## 2019-08-08 MED ORDER — HYDROCORTISONE (PERIANAL) 2.5 % EX CREA: 1.0000 "application " | TOPICAL_CREAM | Freq: Two times a day (BID) | CUTANEOUS | 0 refills | Status: DC

## 2019-08-11 ENCOUNTER — Other Ambulatory Visit: Payer: Self-pay | Admitting: Surgery

## 2019-08-11 DIAGNOSIS — D242 Benign neoplasm of left breast: Secondary | ICD-10-CM

## 2019-08-29 ENCOUNTER — Encounter (HOSPITAL_BASED_OUTPATIENT_CLINIC_OR_DEPARTMENT_OTHER): Payer: Self-pay

## 2019-08-29 ENCOUNTER — Other Ambulatory Visit: Payer: Self-pay

## 2019-09-03 ENCOUNTER — Inpatient Hospital Stay (HOSPITAL_COMMUNITY)
Admission: RE | Admit: 2019-09-03 | Discharge: 2019-09-03 | Disposition: A | Discharge status: Home / Self Care | Payer: 59 | Source: Ambulatory Visit

## 2019-09-03 NOTE — Progress Notes (Signed)
LVM for pt to come on 11/30 for testing, as she is too early today.

## 2019-09-05 ENCOUNTER — Other Ambulatory Visit (HOSPITAL_COMMUNITY)
Admission: RE | Admit: 2019-09-05 | Discharge: 2019-09-05 | Disposition: A | Discharge status: Home / Self Care | Payer: 59 | Source: Ambulatory Visit | Attending: Surgery | Admitting: Surgery

## 2019-09-05 ENCOUNTER — Other Ambulatory Visit: Payer: Self-pay

## 2019-09-05 DIAGNOSIS — Z20822 Contact with and (suspected) exposure to covid-19: Secondary | ICD-10-CM

## 2019-09-05 NOTE — Progress Notes (Signed)
Pt came through the testing site today and refused testing after discovering that the test was nasopyhargeal. Pt states "The lady at the surgical center told me that it would be a nasal swab. I told her I refused to have that one that goes far back! They assured me that as long as I went to one of the [facility] testing sites that I would not have to get that one!" Pt educated that the testing site has not used nasal swabs in months and we only use the supplies that we are provided with. Pt did not like that answer and became very belligerent. Pt advised that if she went to the community testing site that there would be a great possibility that her test would not be back in time for surgery. Pt stated "I'm calling [place] higher ups because I'm being penalized for this. I'll just reschedule my surgery" Pt then drove away.   Attempted to call [facility] multiple times to make them aware but unsuccessful with reaching anyone at this point.   Jacqlyn Larsen, RN

## 2019-09-06 ENCOUNTER — Encounter (HOSPITAL_BASED_OUTPATIENT_CLINIC_OR_DEPARTMENT_OTHER)
Admission: RE | Admit: 2019-09-06 | Discharge: 2019-09-06 | Disposition: A | Discharge status: Home / Self Care | Payer: 59 | Source: Ambulatory Visit | Attending: Surgery | Admitting: Surgery

## 2019-09-06 ENCOUNTER — Other Ambulatory Visit: Payer: Self-pay

## 2019-09-06 DIAGNOSIS — Z01818 Encounter for other preprocedural examination: Secondary | ICD-10-CM | POA: Insufficient documentation

## 2019-09-06 HISTORY — PX: BREAST EXCISIONAL BIOPSY: SUR124

## 2019-09-06 LAB — BASIC METABOLIC PANEL
Anion gap: 10 (ref 5–15)
BUN: 20 mg/dL (ref 8–23)
CO2: 23 mmol/L (ref 22–32)
Calcium: 9.5 mg/dL (ref 8.9–10.3)
Chloride: 106 mmol/L (ref 98–111)
Creatinine, Ser: 0.68 mg/dL (ref 0.44–1.00)
GFR calc Af Amer: >60 mL/min (ref >60)
GFR calc non Af Amer: >60 mL/min (ref >60)
Glucose, Bld: 94 mg/dL (ref 70–99)
Potassium: 4.7 mmol/L (ref 3.5–5.1)
Sodium: 139 mmol/L (ref 135–145)

## 2019-09-06 NOTE — Progress Notes (Signed)

## 2019-09-07 ENCOUNTER — Ambulatory Visit
Admission: RE | Admit: 2019-09-07 | Discharge: 2019-09-07 | Disposition: A | Discharge status: Home / Self Care | Payer: 59 | Source: Ambulatory Visit | Attending: Surgery | Admitting: Surgery

## 2019-09-07 ENCOUNTER — Other Ambulatory Visit: Payer: Self-pay

## 2019-09-07 DIAGNOSIS — D242 Benign neoplasm of left breast: Secondary | ICD-10-CM

## 2019-09-07 LAB — NOVEL CORONAVIRUS, NAA: SARS-CoV-2, NAA: NOT DETECTED

## 2019-09-07 NOTE — H&P (Signed)
Christina Buchanan  Location: [facility] Patient #: [id number] DOB: Oct 01, 1956 Married / Language: English / Race: White Female   History of Present Illness  The patient is a 63 year old female who presents with a breast mass. This is a pleasant 63 year old female referred by Dr. Rolly Pancake for evaluation of a left breast intraductal papilloma. She was undergoing her routine physical examination when yellow discharge was found from the left nipple. She underwent mammogram and ultrasound showing dilated ducts and a mass in the left breast measuring approximately 1 cm. she has started a biopsy of this left breast mesh I intraductal papilloma and fibrocystic changes. She has a significant family history of breast cancer in her mother and sister both in their 31s. She has had genetic testing and is negative. She has had no further breast nipple discharge. She's had a previous benign breast biopsy in the 1990s. She is otherwise without complaints. Ultrasound of the axilla was unremarkable.   Past Surgical History Breast Biopsy  Left. Colon Polyp Removal - Colonoscopy  Colon Removal - Partial  Foot Surgery  Bilateral. Hysterectomy (not due to cancer) - Complete  Resection of Small Bowel   Diagnostic Studies History  Colonoscopy  1-5 years ago Mammogram  1-3 years ago  Allergies  Penicillins  metFORMIN HCl *ANTIDIABETICS*  Allergies Reconciled   Medication History  Viberzi (100MG  Tablet, Oral) Active. buPROPion HCl ER (XL) (150MG  Tablet ER 24HR, Oral) Active. Nystatin (100000 UNIT/GM Cream, External) Active. Ozempic (1 MG/DOSE) (2MG /1.5ML Soln Pen-inj, Subcutaneous) Active. Simvastatin (20MG  Tablet, Oral) Active. Aspirin (81MG  Tablet, Oral) Active. Ozempic (0.25 or 0.5 MG/DOSE) (2MG /1.5ML Soln Pen-inj, Subcutaneous) Active. Medications Reconciled  Social History Alcohol use  Occasional alcohol use. Caffeine use  Carbonated beverages, Coffee,  Tea. No drug use  Tobacco use  Current some day smoker.  Family History  Breast Cancer  Mother, Sister. Cerebrovascular Accident  Mother. Diabetes Mellitus  Father, Mother. Heart Disease  Father. Hypertension  Father, Mother. Melanoma  Father, Mother. Thyroid problems  Mother.  Pregnancy / Birth History . Age of menopause  43-55 Gravida  3 Length (months) of breastfeeding  3-6 Maternal age  64-25 Para  3  Other Problems  Diabetes Mellitus  Oophorectomy     Review of Systems General Not Present- Appetite Loss, Chills, Fatigue, Fever, Night Sweats, Weight Gain and Weight Loss. Skin Not Present- Change in Wart/Mole, Dryness, Hives, Jaundice, New Lesions, Non-Healing Wounds, Rash and Ulcer. HEENT Not Present- Earache, Hearing Loss, Hoarseness, Nose Bleed, Oral Ulcers, Ringing in the Ears, Seasonal Allergies, Sinus Pain, Sore Throat, Visual Disturbances, Wears glasses/contact lenses and Yellow Eyes. Respiratory Not Present- Bloody sputum, Chronic Cough, Difficulty Breathing, Snoring and Wheezing. Breast Present- Nipple Discharge. Not Present- Breast Mass, Breast Pain and Skin Changes. Cardiovascular Not Present- Chest Pain, Difficulty Breathing Lying Down, Leg Cramps, Palpitations, Rapid Heart Rate, Shortness of Breath and Swelling of Extremities. Gastrointestinal Not Present- Abdominal Pain, Bloating, Bloody Stool, Change in Bowel Habits, Chronic diarrhea, Constipation, Difficulty Swallowing, Excessive gas, Gets full quickly at meals, Hemorrhoids, Indigestion, Nausea, Rectal Pain and Vomiting. Female Genitourinary Not Present- Frequency, Nocturia, Painful Urination, Pelvic Pain and Urgency. Musculoskeletal Not Present- Back Pain, Joint Pain, Joint Stiffness, Muscle Pain, Muscle Weakness and Swelling of Extremities. Neurological Not Present- Decreased Memory, Fainting, Headaches, Numbness, Seizures, Tingling, Tremor, Trouble walking and Weakness. Psychiatric Not  Present- Anxiety, Bipolar, Change in Sleep Pattern, Depression, Fearful and Frequent crying. Endocrine Not Present- Cold Intolerance, Excessive Hunger, Hair Changes, Heat Intolerance, Hot flashes and  New Diabetes. Hematology Not Present- Blood Thinners, Easy Bruising, Excessive bleeding, Gland problems, HIV and Persistent Infections.  Vitals   Weight: 155.8 lb Height: 63in Body Surface Area: 1.74 m Body Mass Index: 27.6 kg/m  Temp.: 97.66F  Pulse: 91 (Regular)  BP: 125/80 (Sitting, Left Arm, Standard)    Physical Exam General Mental Status-Alert. General Appearance-Consistent with stated age. Hydration-Well hydrated. Voice-Normal.  Head and Neck Head-normocephalic, atraumatic with no lesions or palpable masses. Trachea-midline. Thyroid Gland Characteristics - normal size and consistency.  Eye Eyeball - Bilateral-Extraocular movements intact. Sclera/Conjunctiva - Bilateral-No scleral icterus.  Chest and Lung Exam Chest and lung exam reveals -quiet, even and easy respiratory effort with no use of accessory muscles and on auscultation, normal breath sounds, no adventitious sounds and normal vocal resonance. Inspection Chest Wall - Normal. Back - normal.  Breast Breast - Left-Symmetric, Non Tender, No Biopsy scars, no Dimpling - Left, No Inflammation, No Lumpectomy scars, No Mastectomy scars, No Peau d' Orange. Breast - Right-Symmetric, Non Tender, No Biopsy scars, no Dimpling - Right, No Inflammation, No Lumpectomy scars, No Mastectomy scars, No Peau d' Orange. Breast Lump-No Palpable Breast Mass. Note: There are no palpable breast masses.   Cardiovascular Cardiovascular examination reveals -normal heart sounds, regular rate and rhythm with no murmurs and normal pedal pulses bilaterally.  Abdomen - Did not examine.  Neurologic - Did not examine.  Musculoskeletal - Did not examine.  Lymphatic Head & Neck  General Head & Neck  Lymphatics: Bilateral - Description - Normal. Axillary  General Axillary Region: Bilateral - Description - Normal. Tenderness - Non Tender. Femoral & Inguinal - Did not examine.    Assessment & Plan   INTRADUCTAL PAPILLOMA OF BREAST, LEFT (D24.2)  Impression: I have reviewed her breast imaging and pathology results. I gave her a copy of her pathology results. Given the findings on biopsy as well as her strong family history, a left breast radioactive seed guided lumpectomy is recommended. I discussed the procedure with her in detail. We discussed the risks which includes but is not limited to bleeding, infection, injury to surrounding structures, the need for further procedures if malignancy is found, cardiopulmonary issues, postoperative recovery, etc. She understands and wishes to proceed with surgery which will be scheduled.

## 2019-09-08 ENCOUNTER — Ambulatory Visit (HOSPITAL_BASED_OUTPATIENT_CLINIC_OR_DEPARTMENT_OTHER): Payer: 59 | Admitting: Certified Registered"

## 2019-09-08 ENCOUNTER — Other Ambulatory Visit: Payer: Self-pay

## 2019-09-08 ENCOUNTER — Ambulatory Visit
Admission: RE | Admit: 2019-09-08 | Discharge: 2019-09-08 | Disposition: A | Discharge status: Home / Self Care | Payer: 59 | Source: Ambulatory Visit | Attending: Surgery | Admitting: Surgery

## 2019-09-08 ENCOUNTER — Encounter (HOSPITAL_BASED_OUTPATIENT_CLINIC_OR_DEPARTMENT_OTHER)
Admission: RE | Disposition: A | Discharge status: Home / Self Care | Payer: Self-pay | Source: Home / Self Care | Attending: Surgery

## 2019-09-08 ENCOUNTER — Encounter (HOSPITAL_BASED_OUTPATIENT_CLINIC_OR_DEPARTMENT_OTHER): Payer: Self-pay | Admitting: *Deleted

## 2019-09-08 ENCOUNTER — Ambulatory Visit (HOSPITAL_BASED_OUTPATIENT_CLINIC_OR_DEPARTMENT_OTHER)
Admission: RE | Admit: 2019-09-08 | Discharge: 2019-09-08 | Disposition: A | Discharge status: Home / Self Care | Payer: 59 | Attending: Surgery | Admitting: Surgery

## 2019-09-08 DIAGNOSIS — Z803 Family history of malignant neoplasm of breast: Secondary | ICD-10-CM | POA: Insufficient documentation

## 2019-09-08 DIAGNOSIS — D242 Benign neoplasm of left breast: Secondary | ICD-10-CM

## 2019-09-08 DIAGNOSIS — F329 Major depressive disorder, single episode, unspecified: Secondary | ICD-10-CM | POA: Insufficient documentation

## 2019-09-08 DIAGNOSIS — M1990 Unspecified osteoarthritis, unspecified site: Secondary | ICD-10-CM | POA: Insufficient documentation

## 2019-09-08 DIAGNOSIS — Z7982 Long term (current) use of aspirin: Secondary | ICD-10-CM | POA: Insufficient documentation

## 2019-09-08 DIAGNOSIS — Z79899 Other long term (current) drug therapy: Secondary | ICD-10-CM | POA: Diagnosis not present

## 2019-09-08 DIAGNOSIS — E119 Type 2 diabetes mellitus without complications: Secondary | ICD-10-CM | POA: Diagnosis not present

## 2019-09-08 DIAGNOSIS — F17200 Nicotine dependence, unspecified, uncomplicated: Secondary | ICD-10-CM | POA: Insufficient documentation

## 2019-09-08 HISTORY — PX: BREAST LUMPECTOMY WITH RADIOACTIVE SEED LOCALIZATION: SHX6424

## 2019-09-08 LAB — GLUCOSE, CAPILLARY
Glucose-Capillary: 88 mg/dL (ref 70–99)
Glucose-Capillary: 92 mg/dL (ref 70–99)

## 2019-09-08 SURGERY — BREAST LUMPECTOMY WITH RADIOACTIVE SEED LOCALIZATION
Anesthesia: General | Site: Breast | Laterality: Left

## 2019-09-08 MED ORDER — METHYLENE BLUE 0.5 % INJ SOLN
INTRAVENOUS | Status: AC
  Filled 2019-09-08: qty 10

## 2019-09-08 MED ORDER — OXYCODONE HCL 5 MG PO TABS: 5.0000 mg | ORAL_TABLET | Freq: Once | ORAL | Status: DC | PRN

## 2019-09-08 MED ORDER — BUPIVACAINE HCL 0.5 % IJ SOLN
INTRAMUSCULAR | Status: DC | PRN
  Administered 2019-09-08: 20 mL

## 2019-09-08 MED ORDER — SODIUM CHLORIDE (PF) 0.9 % IJ SOLN
INTRAMUSCULAR | Status: AC
  Filled 2019-09-08: qty 10

## 2019-09-08 MED ORDER — PROPOFOL 10 MG/ML IV BOLUS
INTRAVENOUS | Status: AC
  Filled 2019-09-08: qty 40

## 2019-09-08 MED ORDER — PROPOFOL 10 MG/ML IV BOLUS
INTRAVENOUS | Status: DC | PRN
  Administered 2019-09-08: 150 mg via INTRAVENOUS

## 2019-09-08 MED ORDER — DEXAMETHASONE SODIUM PHOSPHATE 10 MG/ML IJ SOLN
INTRAMUSCULAR | Status: DC | PRN
  Administered 2019-09-08: 4 mg via INTRAVENOUS

## 2019-09-08 MED ORDER — KETOROLAC TROMETHAMINE 30 MG/ML IJ SOLN: 30.0000 mg | Freq: Once | INTRAMUSCULAR | Status: DC | PRN

## 2019-09-08 MED ORDER — CIPROFLOXACIN IN D5W 400 MG/200ML IV SOLN
INTRAVENOUS | Status: AC
  Filled 2019-09-08: qty 200

## 2019-09-08 MED ORDER — ACETAMINOPHEN 500 MG PO TABS
1000.0000 mg | ORAL_TABLET | ORAL | Status: AC
  Administered 2019-09-08: 1000 mg via ORAL

## 2019-09-08 MED ORDER — CELECOXIB 200 MG PO CAPS
200.0000 mg | ORAL_CAPSULE | ORAL | Status: AC
  Administered 2019-09-08: 200 mg via ORAL

## 2019-09-08 MED ORDER — DEXAMETHASONE SODIUM PHOSPHATE 10 MG/ML IJ SOLN
INTRAMUSCULAR | Status: AC
  Filled 2019-09-08: qty 1

## 2019-09-08 MED ORDER — CIPROFLOXACIN IN D5W 400 MG/200ML IV SOLN
400.0000 mg | INTRAVENOUS | Status: AC
  Administered 2019-09-08: 400 mg via INTRAVENOUS

## 2019-09-08 MED ORDER — PROMETHAZINE HCL 25 MG/ML IJ SOLN: 6.2500 mg | INTRAMUSCULAR | Status: DC | PRN

## 2019-09-08 MED ORDER — FENTANYL CITRATE (PF) 100 MCG/2ML IJ SOLN
INTRAMUSCULAR | Status: AC
  Filled 2019-09-08: qty 2

## 2019-09-08 MED ORDER — LACTATED RINGERS IV SOLN
INTRAVENOUS | Status: DC
  Administered 2019-09-08: 07:00:00 via INTRAVENOUS

## 2019-09-08 MED ORDER — BUPIVACAINE HCL (PF) 0.25 % IJ SOLN
INTRAMUSCULAR | Status: AC
  Filled 2019-09-08: qty 120

## 2019-09-08 MED ORDER — LIDOCAINE HCL (CARDIAC) PF 100 MG/5ML IV SOSY
PREFILLED_SYRINGE | INTRAVENOUS | Status: DC | PRN
  Administered 2019-09-08: 60 mg via INTRAVENOUS

## 2019-09-08 MED ORDER — HYDROMORPHONE HCL 1 MG/ML IJ SOLN: 0.2500 mg | INTRAMUSCULAR | Status: DC | PRN

## 2019-09-08 MED ORDER — OXYCODONE HCL 5 MG/5ML PO SOLN: 5.0000 mg | Freq: Once | ORAL | Status: DC | PRN

## 2019-09-08 MED ORDER — LIDOCAINE 2% (20 MG/ML) 5 ML SYRINGE
INTRAMUSCULAR | Status: AC
  Filled 2019-09-08: qty 5

## 2019-09-08 MED ORDER — TRAMADOL HCL 50 MG PO TABS: 50.0000 mg | ORAL_TABLET | Freq: Four times a day (QID) | ORAL | 0 refills | Status: DC | PRN

## 2019-09-08 MED ORDER — MEPERIDINE HCL 25 MG/ML IJ SOLN: 6.2500 mg | INTRAMUSCULAR | Status: DC | PRN

## 2019-09-08 MED ORDER — ACETAMINOPHEN 500 MG PO TABS
ORAL_TABLET | ORAL | Status: AC
  Filled 2019-09-08: qty 2

## 2019-09-08 MED ORDER — MIDAZOLAM HCL 5 MG/5ML IJ SOLN
INTRAMUSCULAR | Status: DC | PRN
  Administered 2019-09-08: 2 mg via INTRAVENOUS

## 2019-09-08 MED ORDER — ONDANSETRON HCL 4 MG/2ML IJ SOLN
INTRAMUSCULAR | Status: AC
  Filled 2019-09-08: qty 2

## 2019-09-08 MED ORDER — CELECOXIB 200 MG PO CAPS
ORAL_CAPSULE | ORAL | Status: AC
  Filled 2019-09-08: qty 1

## 2019-09-08 MED ORDER — GABAPENTIN 300 MG PO CAPS
300.0000 mg | ORAL_CAPSULE | ORAL | Status: AC
  Administered 2019-09-08: 300 mg via ORAL

## 2019-09-08 MED ORDER — FENTANYL CITRATE (PF) 100 MCG/2ML IJ SOLN
INTRAMUSCULAR | Status: DC | PRN
  Administered 2019-09-08: 25 ug via INTRAVENOUS

## 2019-09-08 MED ORDER — MIDAZOLAM HCL 2 MG/2ML IJ SOLN
INTRAMUSCULAR | Status: AC
  Filled 2019-09-08: qty 2

## 2019-09-08 MED ORDER — GABAPENTIN 300 MG PO CAPS
ORAL_CAPSULE | ORAL | Status: AC
  Filled 2019-09-08: qty 1

## 2019-09-08 MED ORDER — ONDANSETRON HCL 4 MG/2ML IJ SOLN
INTRAMUSCULAR | Status: DC | PRN
  Administered 2019-09-08: 4 mg via INTRAVENOUS

## 2019-09-08 MED ORDER — CHLORHEXIDINE GLUCONATE CLOTH 2 % EX PADS: 6.0000 | MEDICATED_PAD | Freq: Once | CUTANEOUS | Status: DC

## 2019-09-08 MED ORDER — BUPIVACAINE HCL (PF) 0.5 % IJ SOLN
INTRAMUSCULAR | Status: AC
  Filled 2019-09-08: qty 60

## 2019-09-08 MED ORDER — EPHEDRINE SULFATE 50 MG/ML IJ SOLN
INTRAMUSCULAR | Status: DC | PRN
  Administered 2019-09-08: 5 mg via INTRAVENOUS

## 2019-09-08 SURGICAL SUPPLY — 38 items
ADH SKN CLS APL DERMABOND .7 (GAUZE/BANDAGES/DRESSINGS) ×1
APL PRP STRL LF DISP 70% ISPRP (MISCELLANEOUS) ×1
BINDER BREAST LRG (GAUZE/BANDAGES/DRESSINGS) ×1 IMPLANT
BLADE HEX COATED 2.75 (ELECTRODE) ×2 IMPLANT
BLADE SURG 15 STRL LF DISP TIS (BLADE) ×1 IMPLANT
BLADE SURG 15 STRL SS (BLADE) ×2
CHLORAPREP W/TINT 26 (MISCELLANEOUS) ×2 IMPLANT
COVER BACK TABLE REUSABLE LG (DRAPES) ×2 IMPLANT
COVER MAYO STAND REUSABLE (DRAPES) ×2 IMPLANT
COVER PROBE W GEL 5X96 (DRAPES) ×2 IMPLANT
DERMABOND ADVANCED (GAUZE/BANDAGES/DRESSINGS) ×1
DERMABOND ADVANCED .7 DNX12 (GAUZE/BANDAGES/DRESSINGS) ×1 IMPLANT
DRAPE LAPAROSCOPIC ABDOMINAL (DRAPES) ×2 IMPLANT
DRAPE UTILITY XL STRL (DRAPES) ×2 IMPLANT
ELECT REM PT RETURN 9FT ADLT (ELECTROSURGICAL) ×2
ELECTRODE REM PT RTRN 9FT ADLT (ELECTROSURGICAL) ×1 IMPLANT
GLOVE BIO SURGEON STRL SZ7 (GLOVE) ×1 IMPLANT
GLOVE BIOGEL PI IND STRL 6.5 (GLOVE) IMPLANT
GLOVE BIOGEL PI INDICATOR 6.5 (GLOVE) ×2
GLOVE SURG SIGNA 7.5 PF LTX (GLOVE) ×2 IMPLANT
GOWN STRL REUS W/ TWL LRG LVL3 (GOWN DISPOSABLE) ×1 IMPLANT
GOWN STRL REUS W/ TWL XL LVL3 (GOWN DISPOSABLE) ×1 IMPLANT
GOWN STRL REUS W/TWL LRG LVL3 (GOWN DISPOSABLE) ×2
GOWN STRL REUS W/TWL XL LVL3 (GOWN DISPOSABLE) ×2
KIT MARKER MARGIN INK (KITS) ×2 IMPLANT
NDL HYPO 25X1 1.5 SAFETY (NEEDLE) ×1 IMPLANT
NEEDLE HYPO 25X1 1.5 SAFETY (NEEDLE) ×2 IMPLANT
NS IRRIG 1000ML POUR BTL (IV SOLUTION) IMPLANT
PACK BASIN DAY SURGERY FS (CUSTOM PROCEDURE TRAY) ×2 IMPLANT
PENCIL SMOKE EVACUATOR (MISCELLANEOUS) ×2 IMPLANT
SLEEVE SCD COMPRESS KNEE MED (MISCELLANEOUS) ×2 IMPLANT
SPONGE LAP 4X18 RFD (DISPOSABLE) ×2 IMPLANT
SUT MNCRL AB 4-0 PS2 18 (SUTURE) ×2 IMPLANT
SUT VIC AB 3-0 SH 27 (SUTURE) ×2
SUT VIC AB 3-0 SH 27X BRD (SUTURE) ×1 IMPLANT
SYR CONTROL 10ML LL (SYRINGE) ×2 IMPLANT
TOWEL GREEN STERILE FF (TOWEL DISPOSABLE) ×2 IMPLANT
TRAY FAXITRON CT DISP (TRAY / TRAY PROCEDURE) ×2 IMPLANT

## 2019-09-08 NOTE — Op Note (Signed)
LEFT BREAST LUMPECTOMY WITH RADIOACTIVE SEED LOCALIZATION  Procedure Note  Christina Buchanan 09/08/2019   Pre-op Diagnosis: left breast papilloma     Post-op Diagnosis: same  Procedure(s): LEFT BREAST LUMPECTOMY WITH RADIOACTIVE SEED LOCALIZATION  Surgeon(s): Coralie Keens, MD  Anesthesia: General  Staff:  Circulator: Izora Ribas, RN Scrub Person: Patric Dykes, RN  Estimated Blood Loss: Minimal               Specimens: sent to path  Procedure: The patient was brought to operating identifies correct patient.  She is placed on the operating table general anesthesia was induced.  Her left breast was then prepped and draped in usual sterile fashion.  I localized the radioactive seed with the neoprobe in the lower outer quadrant of the left breast just underneath the areola.  I anesthetized the skin of the areola with Marcaine.  I then made a circumareolar incision with the scalpel.  I then dissected down into the breast tissue with the electrocautery.  With the aid of the neoprobe I then performed a lumpectomy staying around the radioactive seed.  Once the lumpectomy specimen was removed, I marked the margins with paint, and then an x-ray was performed on specimen.  This demonstrated the previously placed clip and radioactive seed in the specimen.  The specimens are sent to pathology for evaluation.  I anesthetized the incision further with Marcaine.  I achieved hemostasis with the cautery.  I closed the subcutaneous tissue with interrupted 3-0 Vicryl sutures and closed the skin with a running 4-0 Monocryl.  Dermabond was then applied.  Patient tolerated the procedure well.  All the counts were correct at the end of the procedure.  The patient was then extubated in the operating room and taken in a stable condition to the recovery room.          Coralie Keens   Date: 09/08/2019  Time: 8:02 AM

## 2019-09-08 NOTE — Interval H&P Note (Signed)
History and Physical Interval Note: no change in H and P  09/08/2019 7:11 AM  Christina Buchanan  has presented today for surgery, with the diagnosis of left breast papilloma.  The various methods of treatment have been discussed with the patient and family. After consideration of risks, benefits and other options for treatment, the patient has consented to  Procedure(s): LEFT BREAST LUMPECTOMY WITH RADIOACTIVE SEED LOCALIZATION (Left) as a surgical intervention.  The patient's history has been reviewed, patient examined, no change in status, stable for surgery.  I have reviewed the patient's chart and labs.  Questions were answered to the patient's satisfaction.     Coralie Keens

## 2019-09-08 NOTE — Transfer of Care (Signed)
Immediate Anesthesia Transfer of Care Note  Patient: Christina Buchanan  Procedure(s) Performed: LEFT BREAST LUMPECTOMY WITH RADIOACTIVE SEED LOCALIZATION (Left Breast)  Patient Location: PACU  Anesthesia Type:General  Level of Consciousness: drowsy  Airway & Oxygen Therapy: Patient Spontanous Breathing and Patient connected to face mask oxygen  Post-op Assessment: Report given to RN and Post -op Vital signs reviewed and stable  Post vital signs: Reviewed and stable  Last Vitals:  Vitals Value Taken Time  BP 132/69 09/08/19 0811  Temp    Pulse 72 09/08/19 0812  Resp 14 09/08/19 0812  SpO2 100 % 09/08/19 0812  Vitals shown include unvalidated device data.  Last Pain:  Vitals:   09/08/19 0640  TempSrc: Oral  PainSc: 0-No pain      Patients Stated Pain Goal: 6 ([id number])  Complications: No apparent anesthesia complications

## 2019-09-08 NOTE — Anesthesia Procedure Notes (Signed)
Procedure Name: LMA Insertion Date/Time: 09/08/2019 7:34 AM Performed by: Lavonia Dana, CRNA Pre-anesthesia Checklist: Patient identified, Emergency Drugs available, Suction available and Patient being monitored Patient Re-evaluated:Patient Re-evaluated prior to induction Oxygen Delivery Method: Circle system utilized Preoxygenation: Pre-oxygenation with 100% oxygen Induction Type: IV induction Ventilation: Mask ventilation without difficulty LMA: LMA inserted LMA Size: 4.0 Number of attempts: 1 Airway Equipment and Method: Bite block Placement Confirmation: positive ETCO2 Tube secured with: Tape Dental Injury: Teeth and Oropharynx as per pre-operative assessment

## 2019-09-08 NOTE — Anesthesia Postprocedure Evaluation (Signed)
Anesthesia Post Note  Patient: Christina Buchanan  Procedure(s) Performed: LEFT BREAST LUMPECTOMY WITH RADIOACTIVE SEED LOCALIZATION (Left Breast)     Patient location during evaluation: PACU Anesthesia Type: General Level of consciousness: awake and alert, oriented and patient cooperative Pain management: pain level controlled Vital Signs Assessment: post-procedure vital signs reviewed and stable Respiratory status: spontaneous breathing, nonlabored ventilation and respiratory function stable Cardiovascular status: blood pressure returned to baseline and stable Postop Assessment: no apparent nausea or vomiting Anesthetic complications: no    Last Vitals:  Vitals:   09/08/19 0841 09/08/19 0905  BP: (!) 143/84 128/72  Pulse: 66 70  Resp: 15 16  Temp:  36.5 C  SpO2: 96% 100%    Last Pain:  Vitals:   09/08/19 0905  TempSrc: Oral  PainSc: 0-No pain                 Pervis Hocking

## 2019-09-08 NOTE — Discharge Instructions (Signed)
[place] Office Phone Number [phone]  BREAST BIOPSY/ PARTIAL MASTECTOMY: POST OP INSTRUCTIONS  Always review your discharge instruction sheet given to you by the facility where your surgery was performed.  IF YOU HAVE DISABILITY OR FAMILY LEAVE FORMS, YOU MUST BRING THEM TO THE OFFICE FOR PROCESSING.  DO NOT GIVE THEM TO YOUR DOCTOR.  1. A prescription for pain medication may be given to you upon discharge.  Take your pain medication as prescribed, if needed.  If narcotic pain medicine is not needed, then you may take acetaminophen (Tylenol) or ibuprofen (Advil) as needed.  Next does for tylenol may be given at 12 noon Next dose for ibuprofen may be given at 2pm  2. Take your usually prescribed medications unless otherwise directed 3. If you need a refill on your pain medication, please contact your pharmacy.  They will contact our office to request authorization.  Prescriptions will not be filled after 5pm or on week-ends. 4. You should eat very light the first 24 hours after surgery, such as soup, crackers, pudding, etc.  Resume your normal diet the day after surgery. 5. Most patients will experience some swelling and bruising in the breast.  Ice packs and a good support bra will help.  Swelling and bruising can take several days to resolve.  6. It is common to experience some constipation if taking pain medication after surgery.  Increasing fluid intake and taking a stool softener will usually help or prevent this problem from occurring.  A mild laxative (Milk of Magnesia or Miralax) should be taken according to package directions if there are no bowel movements after 48 hours. 7. Unless discharge instructions indicate otherwise, you may remove your bandages 24-48 hours after surgery, and you may shower at that time.  You may have steri-strips (small skin tapes) in place directly over the incision.  These strips should be left on the skin for 7-10 days.  If your surgeon  used skin glue on the incision, you may shower in 24 hours.  The glue will flake off over the next 2-3 weeks.  Any sutures or staples will be removed at the office during your follow-up visit. 8. ACTIVITIES:  You may resume regular daily activities (gradually increasing) beginning the next day.  Wearing a good support bra or sports bra minimizes pain and swelling.  You may have sexual intercourse when it is comfortable. a. You may drive when you no longer are taking prescription pain medication, you can comfortably wear a seatbelt, and you can safely maneuver your car and apply brakes. b. RETURN TO WORK:  ______________________________________________________________________________________ 9. You should see your doctor in the office for a follow-up appointment approximately two weeks after your surgery.  Your doctors nurse will typically make your follow-up appointment when she calls you with your pathology report.  Expect your pathology report 2-3 business days after your surgery.  You may call to check if you do not hear from [place] after three days. 10. OTHER INSTRUCTIONS: __OK TO SHOWER STARTING TOMORROW 11. ICE PACK, TYLENOL, AND IBUPROFEN FOR PAIN 12. NO VIGOROUS ACTIVITY FOR ONE WEEK 13. _____________________________________________________________________________________________ _____________________________________________________________________________________________________________________________________ _____________________________________________________________________________________________________________________________________ _____________________________________________________________________________________________________________________________________  WHEN TO CALL YOUR DOCTOR: 1. Fever over 101.0 2. Nausea and/or vomiting. 3. Extreme swelling or bruising. 4. Continued bleeding from incision. 5. Increased pain, redness, or drainage from the incision.  The clinic staff  is available to answer your questions during regular business hours.  Please dont hesitate to call and ask to speak to one of the  nurses for clinical concerns.  If you have a medical emergency, go to the nearest emergency room or call 911.  A surgeon from [facility] is always on call at the hospital.  For further questions, please visit [email or web address]    Post Anesthesia Home Care Instructions  Activity: Get plenty of rest for the remainder of the day. A responsible individual must stay with you for 24 hours following the procedure.  For the next 24 hours, DO NOT: -Drive a car -Paediatric nurse -Drink alcoholic beverages -Take any medication unless instructed by your physician -Make any legal decisions or sign important papers.  Meals: Start with liquid foods such as gelatin or soup. Progress to regular foods as tolerated. Avoid greasy, spicy, heavy foods. If nausea and/or vomiting occur, drink only clear liquids until the nausea and/or vomiting subsides. Call your physician if vomiting continues.  Special Instructions/Symptoms: Your throat may feel dry or sore from the anesthesia or the breathing tube placed in your throat during surgery. If this causes discomfort, gargle with warm salt water. The discomfort should disappear within 24 hours.  If you had a scopolamine patch placed behind your ear for the management of post- operative nausea and/or vomiting:  1. The medication in the patch is effective for 72 hours, after which it should be removed.  Wrap patch in a tissue and discard in the trash. Wash hands thoroughly with soap and water. 2. You may remove the patch earlier than 72 hours if you experience unpleasant side effects which may include dry mouth, dizziness or visual disturbances. 3. Avoid touching the patch. Wash your hands with soap and water after contact with the patch.

## 2019-09-08 NOTE — Anesthesia Preprocedure Evaluation (Addendum)
Anesthesia Evaluation  Patient identified by MRN, date of birth, ID band Patient awake    Reviewed: Allergy & Precautions, NPO status , Patient's Chart, lab work & pertinent test results  Airway Mallampati: II  TM Distance: >3 FB Neck ROM: Full    Dental  (+) Teeth Intact, Dental Advisory Given   Pulmonary sleep apnea , Current Smoker and Patient abstained from smoking.,  Current smoker, 9 pack year history   Pulmonary exam normal breath sounds clear to auscultation       Cardiovascular negative cardio ROS Normal cardiovascular exam Rhythm:Regular Rate:Normal     Neuro/Psych PSYCHIATRIC DISORDERS Depression negative neurological ROS     GI/Hepatic negative GI ROS, Neg liver ROS,   Endo/Other  diabetes, Well Controlled, Type 2, Oral Hypoglycemic AgentsDiet controlled T2DM  Renal/GU negative Renal ROS   Hx uterine fibroid    Musculoskeletal  (+) Arthritis , Osteoarthritis,    Abdominal Normal abdominal exam  (+)   Peds  Hematology negative hematology ROS (+)   Anesthesia Other Findings Left breast papilloma  HLD  Reproductive/Obstetrics negative OB ROS                            Anesthesia Physical Anesthesia Plan  ASA: II  Anesthesia Plan: General   Post-op Pain Management:    Induction: Intravenous  PONV Risk Score and Plan: 2 and Ondansetron, Dexamethasone, Midazolam and Treatment may vary due to age or medical condition  Airway Management Planned: LMA  Additional Equipment: None  Intra-op Plan:   Post-operative Plan: Extubation in OR  Informed Consent: I have reviewed the patients History and Physical, chart, labs and discussed the procedure including the risks, benefits and alternatives for the proposed anesthesia with the patient or authorized representative who has indicated his/her understanding and acceptance.     Dental advisory given  Plan Discussed with:  CRNA  Anesthesia Plan Comments:         Anesthesia Quick Evaluation

## 2019-09-09 ENCOUNTER — Encounter (HOSPITAL_BASED_OUTPATIENT_CLINIC_OR_DEPARTMENT_OTHER): Payer: Self-pay | Admitting: Surgery

## 2019-09-09 ENCOUNTER — Other Ambulatory Visit: Payer: Self-pay | Admitting: Obstetrics and Gynecology

## 2019-09-09 DIAGNOSIS — Z01419 Encounter for gynecological examination (general) (routine) without abnormal findings: Secondary | ICD-10-CM

## 2019-09-09 NOTE — Telephone Encounter (Signed)
Med refill request: Wellbutrin XL Last AEX: 07/05/2019 Next AEX: 08/01/2020 Last MMG (if hormonal med)  Refill authorized: #90, 0 RF, orders pended if approved.   PCP Dr Quay Burow refused this Rx.  Will route to Dr Quincy Simmonds.

## 2019-09-12 NOTE — Telephone Encounter (Signed)
Spoke with pt. Pt states pharmacy requested and does not want Rx. Pt  will let [facility] know that no longer takes med and not to request it.   Will route to Dr Quincy Simmonds for final review and will close encounter/addendum.

## 2019-09-13 ENCOUNTER — Other Ambulatory Visit: Payer: Self-pay

## 2019-09-13 LAB — SURGICAL PATHOLOGY

## 2019-09-15 DIAGNOSIS — R69 Illness, unspecified: Secondary | ICD-10-CM | POA: Diagnosis not present

## 2019-09-21 ENCOUNTER — Other Ambulatory Visit: Payer: Self-pay | Admitting: Internal Medicine

## 2019-09-22 ENCOUNTER — Other Ambulatory Visit: Payer: Self-pay | Admitting: Internal Medicine

## 2019-09-22 MED ORDER — AZELASTINE HCL 0.1 % NA SOLN: NASAL | 0 refills | Status: DC

## 2019-10-04 ENCOUNTER — Telehealth: Payer: Self-pay | Admitting: *Deleted

## 2019-10-04 NOTE — Telephone Encounter (Signed)
Patient is in [facility] recall for intraductal papilloma of left breast.   Per review of Epic, Left breast lumpectomy on 09/08/2019.   Dr. Quincy Simmonds -ok to remove from [facility] hold?

## 2019-10-05 NOTE — Telephone Encounter (Signed)
Ok to remove from mammogram hold.  

## 2019-10-06 NOTE — Telephone Encounter (Signed)
Patient removed from MM[facility] hold.   Encounter closed.  

## 2019-10-16 ENCOUNTER — Other Ambulatory Visit: Payer: Self-pay | Admitting: Internal Medicine

## 2019-10-17 ENCOUNTER — Telehealth (INDEPENDENT_AMBULATORY_CARE_PROVIDER_SITE_OTHER): Payer: 59 | Admitting: Pulmonary Disease

## 2019-10-17 ENCOUNTER — Encounter: Payer: Self-pay | Admitting: Pulmonary Disease

## 2019-10-17 DIAGNOSIS — Z9989 Dependence on other enabling machines and devices: Secondary | ICD-10-CM | POA: Diagnosis not present

## 2019-10-17 DIAGNOSIS — G4733 Obstructive sleep apnea (adult) (pediatric): Secondary | ICD-10-CM

## 2019-10-17 NOTE — Patient Instructions (Addendum)
  Blood work was ordered.   ° ° °Medications reviewed and updated.  Changes include :   none ° ° ° °Please followup in 6 months ° ° °

## 2019-10-17 NOTE — Progress Notes (Signed)
Subjective:    Patient ID: Christina Buchanan, female    DOB: 07-07-1956, 64 y.o.   MRN: [id number]  HPI The patient is here for follow up of their chronic medical problems, including OSA, IBS-D, daibets, depression, hyperlipidemia, tobacco abuse.  She is taking all of her medications as prescribed.   She is exercising some.  She has been on a no sugar diet for 4 months and she has lost weight.    She is still smoking cigarettes.  She smokes maximum of 5 cigarettes a day.  She typically only smokes at night when she is drinking her tea and relaxing.  She did try Wellbutrin, but had side effects with it.  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  She did have complications of an infection, which was successfully treated and she has recovered.     Medications and allergies reviewed with patient and updated if appropriate.  Patient Active Problem List   Diagnosis Date Noted  . Hair changes 10/08/2018  . Genetic testing 06/09/2018  . Family history of breast cancer   . IBS (irritable bowel syndrome) - D 04/05/2018  . OSA (obstructive sleep apnea) 12/15/2017  . Wears hearing aid in both ears 09/08/2017  . Hemorrhoids 09/08/2017  . Status post total abdominal hysterectomy and bilateral salpingo-oophorectomy 10/02/2016  . Diabetes ([place]) 08/12/2016  . Hyperlipidemia 04/29/2016  . Estrogen deficiency 04/29/2016  . Tobacco abuse 01/21/2010  . Depression 01/21/2010  . ALLERGIC RHINITIS 01/21/2010  . Personal history of colonic polyps 07/30/2006    Current Outpatient Medications on File Prior to Visit  Medication Sig Dispense Refill  . aspirin EC 81 MG tablet Take 1 tablet (81 mg total) by mouth daily.    [name] azelastine (ASTELIN) 0.1 % nasal spray USE IN EACH NOSTRIL AS DIRECTED 30 mL 0  . cetirizine (ZYRTEC) 10 MG tablet Take 10 mg by mouth daily as needed for allergies.     . Eluxadoline (VIBERZI) 100 MG TABS Take 1 tablet (100 mg total) by mouth  2 (two) times daily. 180 tablet 1  . escitalopram (LEXAPRO) 10 MG tablet Take 1 tablet (10 mg total) by mouth daily. 90 tablet 3  . OZEMPIC, 1 MG/DOSE, 2 MG/1.5ML SOPN INJECT 1 MG INTO THE SKIN ONCE A WEEK. 3 pen 2  . simvastatin (ZOCOR) 20 MG tablet TAKE 1 TABLET (20 MG TOTAL) BY MOUTH DAILY AT 6 PM. 90 tablet 3  . triamcinolone (KENALOG) 0.025 % ointment Apply 1 application topically 2 (two) times daily. Use for one to two weeks at a time. 30 g 1   No current facility-administered medications on file prior to visit.    Past Medical History:  Diagnosis Date  . ALLERGIC RHINITIS   . Arthritis    hands  . DEPRESSION   . Diabetes mellitus without complication ([place]) 123456   diet controlled  . Family history of breast cancer   . Fibroid, uterine 04/29/2016  . HYPERLIPIDEMIA   . OSA (obstructive sleep apnea) 12/15/2017  . Osteopenia   . Personal history of colonic polyps   . SMOKER   . VITAMIN D DEFICIENCY     Past Surgical History:  Procedure Laterality Date  . ABDOMINAL HYSTERECTOMY N/A 10/02/2016   Procedure: HYSTERECTOMY ABDOMINAL;  Surgeon: Nunzio Cobbs, MD;  Location: [facility];  Service: Gynecology;  Laterality: N/A;  4 hours  . ABDOMINAL SACROCOLPOPEXY N/A 10/02/2016   Procedure: ABDOMINO SACROCOLPOPEXY;  Surgeon: Dietrich Pates  E Yisroel Ramming, MD;  Location: [facility];  Service: Gynecology;  Laterality: N/A;  . ANTERIOR AND POSTERIOR REPAIR N/A 10/02/2016   Procedure: ANTERIOR (CYSTOCELE) AND POSTERIOR REPAIR (RECTOCELE);  Surgeon: Nunzio Cobbs, MD;  Location: [facility];  Service: Gynecology;  Laterality: N/A;  . BLADDER SUSPENSION N/A 10/02/2016   Procedure: TRANSVAGINAL TAPE (TVT) PROCEDURE exact midurethral sling;  Surgeon: Nunzio Cobbs, MD;  Location: [facility];  Service: Gynecology;  Laterality: N/A;  . BREAST LUMPECTOMY WITH RADIOACTIVE SEED LOCALIZATION Left 09/08/2019   Procedure: LEFT BREAST LUMPECTOMY WITH RADIOACTIVE SEED LOCALIZATION;  Surgeon:  Coralie Keens, MD;  Location: [facility];  Service: General;  Laterality: Left;  . BREAST SURGERY  1990   breast biopsy  . COLONOSCOPY W/ BIOPSIES  9/07   2 polyps & tubuvillous adenoma  . COLONOSCOPY W/ BIOPSIES  11/13/08   sigmoid ddiverticuli and rechek in 5 years - Dr. Collene Mares  . CYSTOSCOPY N/A 10/02/2016   Procedure: CYSTOSCOPY;  Surgeon: Nunzio Cobbs, MD;  Location: [facility];  Service: Gynecology;  Laterality: N/A;  . ENDOMETRIAL ABLATION  08/21/03   HTA  . 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 SALPINGOOPHORECTOMY Bilateral 10/02/2016   Procedure: SALPINGO OOPHORECTOMY;  Surgeon: Nunzio Cobbs, MD;  Location: [facility];  Service: Gynecology;  Laterality: Bilateral;  . UPPER GASTROINTESTINAL ENDOSCOPY  06/15/06   2 polyps - H pylori    Social History   Socioeconomic History  . Marital status: Married    Spouse name: Not on file  . Number of children: Not on file  . Years of education: Not on file  . Highest education level: Not on file  Occupational History  . Not on file  Tobacco Use  . Smoking status: Current Every Day Smoker    Packs/day: 0.30    Years: 30.00    Pack years: 9.00    Types: Cigarettes  . Smokeless tobacco: Never Used  . Tobacco comment: pt smokes 5 cigarettes per day. Married, lives with spouse. Cares for elderly parent and in-laws  Substance and Sexual Activity  . Alcohol use: Yes    Alcohol/week: 2.0 standard drinks    Types: 2 Standard drinks or equivalent per week    Comment: Rare glass of wine every 2-3 weeks  . Drug use: No  . Sexual activity: Yes    Partners: Male    Birth control/protection: Other-see comments, Post-menopausal    Comment: husband had vasectomy  Other Topics Concern  . Not on file  Social History Narrative   Lives with husband in a one story home.  She has 3 grown children.     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     Education: high  school.        No regular exercise   Social Determinants of Health   Financial Resource Strain:   . Difficulty of Paying Living Expenses: Not on file  Food Insecurity:   . Worried About Charity fundraiser in the Last Year: Not on file  . Ran Out of Food in the Last Year: Not on file  Transportation Needs:   . Lack of Transportation (Medical): Not on file  . Lack of Transportation (Non-Medical): Not on file  Physical Activity:   . Days of Exercise per Week: Not on file  . Minutes of Exercise per Session: Not on file  Stress:   .  Feeling of Stress : Not on file  Social Connections:   . Frequency of Communication with Friends and Family: Not on file  . Frequency of Social Gatherings with Friends and Family: Not on file  . Attends Religious Services: Not on file  . Active Member of Clubs or Organizations: Not on file  . Attends Archivist Meetings: Not on file  . Marital Status: Not on file    Family History  Problem Relation Age of Onset  . Breast cancer Mother 45  . Diabetes Mother   . Stroke Mother               . Hyperlipidemia Mother   . Heart failure Mother   . Hypertension Mother   . COPD Mother   . Hypertension Father   . Heart disease Father   . Hyperlipidemia Father   . Heart failure Father        dec at age 31  . Breast cancer Sister 56  . Heart disease Sister        Afib  . Psoriasis Son     Review of Systems  Constitutional: Negative for chills and fever.  Respiratory: Negative for cough, shortness of breath and wheezing.   Cardiovascular: Negative for chest pain, palpitations and leg swelling.  Neurological: Negative for light-headedness and headaches.       Objective:   Vitals:   10/18/19 0907  BP: 138/82  Pulse: 72  Resp: 16  Temp: 98.4 F (36.9 C)  SpO2: 96%   BP Readings from Last 3 Encounters:  10/18/19 138/82  09/08/19 128/72  07/05/19 (!) 142/78   Wt Readings from Last 3 Encounters:  10/18/19 148 lb (67.1 kg)    09/08/19 147 lb 7.8 oz (66.9 kg)  07/05/19 158 lb 9.6 oz (71.9 kg)   Body mass index is 26.22 kg/m.   Physical Exam    Constitutional: Appears well-developed and well-nourished. No distress.  HENT:  Head: Normocephalic and atraumatic.  Neck: Neck supple. No tracheal deviation present. No thyromegaly present.  No cervical lymphadenopathy Cardiovascular: Normal rate, regular rhythm and normal heart sounds.   No murmur heard. No carotid bruit .  No edema Pulmonary/Chest: Effort normal and breath sounds normal. No respiratory distress. No has no wheezes. No rales.  Skin: Skin is warm and dry. Not diaphoretic.  Psychiatric: Normal mood and affect. Behavior is normal.      Assessment & Plan:    See Problem List for Assessment and Plan of chronic medical problems.    This visit occurred during the SARS-CoV-2 public health emergency.  Safety protocols were in place, including screening questions prior to the visit, additional usage of staff PPE, and extensive cleaning of exam room while observing appropriate contact time as indicated for disinfecting solutions.

## 2019-10-17 NOTE — Patient Instructions (Signed)
Follow up in 1 year.

## 2019-10-17 NOTE — Progress Notes (Signed)
[facility]  Chief Complaint  Patient presents with  . Follow-up    pt states she is doing well at this time.  does not need supplies at this time.      Constitutional:  LMP 03/06/2006 (Approximate)   Deferred.  Past Medical History:  Depression, DM, HLD, Osteopenia, Colon polyps, Vit D deficiency   Brief Summary:  Christina Buchanan is a 64 y.o. female with obstructive sleep apnea.  Virtual Visit via Telephone Note  I connected with Christina Buchanan on 10/17/19 at  3:45 PM EST by telephone and verified that I am speaking with the correct person using two identifiers.  Location: Patient: home Provider: medical office   I discussed the limitations, risks, security and privacy concerns of performing an evaluation and management service by telephone and the availability of in person appointments. I also discussed with the patient that there may be a patient responsible charge related to this service. The patient expressed understanding and agreed to proceed.  She is doing well with CPAP.  Has nasal cushion mask.  No issue with mask fit.  Not having dry mouth, sore throat, or aerophagia.   Physical Exam:   Deferred.  Assessment/Plan:   Obstructive sleep apnea. - she is compliant with CPAP and reports benefit from therapy - continue auto CPAP   Patient Instructions  Follow up in 1 year    I discussed the assessment and treatment plan with the patient. The patient was provided an opportunity to ask questions and all were answered. The patient agreed with the plan and demonstrated an understanding of the instructions.   The patient was advised to call back or seek an in-person evaluation if the symptoms worsen or if the condition fails to improve as anticipated.  I provided 8 minutes of non-face-to-face time during this encounter.  Chesley Mires, MD [facility] Pager: [phone] 10/17/2019, 3:49 PM  Flow  Sheet    Sleep tests:  HST 12/09/17 >> AHI 60.3, SaO2 80% Auto CPAP 09/13/19 to 10/12/19 >> used on 30 of 30 nights with average 7 hrs 1 min.  Average AHI 1.6 with median CPAP 8 and 95 th percentile CPAP 9 cm H2O  Medications:   Allergies as of 10/17/2019      Reactions   Penicillins Nausea And Vomiting   Metformin And Related    Loose stools      Medication List       Accurate as of October 17, 2019  3:49 PM. If you have any questions, ask your nurse or doctor.        STOP taking these medications   traMADol 50 MG tablet Commonly known as: ULTRAM Stopped by: Chesley Mires, MD     TAKE these medications   aspirin EC 81 MG tablet Take 1 tablet (81 mg total) by mouth daily.   azelastine 0.1 % nasal spray Commonly known as: ASTELIN USE IN EACH NOSTRIL AS DIRECTED   cetirizine 10 MG tablet Commonly known as: ZYRTEC Take 10 mg by mouth daily as needed for allergies.   escitalopram 10 MG tablet Commonly known as: Lexapro Take 1 tablet (10 mg total) by mouth daily.   Ozempic (1 MG/DOSE) 2 MG/1.5ML Sopn Generic drug: Semaglutide (1 MG/DOSE) INJECT 1 MG INTO THE SKIN ONCE A WEEK.   simvastatin 20 MG tablet Commonly known as: ZOCOR TAKE 1 TABLET (20 MG TOTAL) BY MOUTH DAILY AT 6 PM.   triamcinolone 0.025 % ointment Commonly  known as: KENALOG Apply 1 application topically 2 (two) times daily. Use for one to two weeks at a time.   Viberzi 100 MG Tabs Generic drug: Eluxadoline Take 1 tablet (100 mg total) by mouth 2 (two) times daily.       Past Surgical History:  She  has a past surgical history that includes Right colectomy (07/30/06); Breast surgery (1990); Endometrial ablation (08/21/03); Colonoscopy w/ biopsies (9/07); Upper gastrointestinal endoscopy (06/15/06); Colonoscopy w/ biopsies (11/13/08); Abdominal hysterectomy (N/A, 10/02/2016); Salpingoophorectomy (Bilateral, 10/02/2016); Abdominal sacrocolpopexy (N/A, 10/02/2016); Anterior and posterior repair (N/A,  10/02/2016); Bladder suspension (N/A, 10/02/2016); Cystoscopy (N/A, 10/02/2016); and Breast lumpectomy with radioactive seed localization (Left, 09/08/2019).  Family History:  Her family history includes Breast cancer (age of onset: 37) in her mother; Breast cancer (age of onset: 1) in her sister; COPD in her mother; Diabetes in her mother; Heart disease in her father and sister; Heart failure in her father and mother; Hyperlipidemia in her father and mother; Hypertension in her father and mother; Psoriasis in her son; Stroke in her mother.  Social History:  She  reports that she has been smoking cigarettes. She has a 9.00 pack-year smoking history. She has never used smokeless tobacco. She reports current alcohol use of about 2.0 standard drinks of alcohol per week. She reports that she does not use drugs.

## 2019-10-17 NOTE — Assessment & Plan Note (Addendum)
Smoking cessation was discussed for more than 3 minutes.  The patient was counseled on the dangers of tobacco use, and was advised to quit.  Reviewed ways of quitting smoking including nicotine replacement, vapping/e-cigarettes, cold [place], weaning off cigarettes, and pharmacotherapy (wellbutrin and chantix).  She has tried Wellbutrin and did not tolerate it.  Discussed Chantix, but most likely this is not the right medication for her because of how much she smokes and when she smokes.  She does want to quit, but is not 100% committed to quitting.  She does not smoke much and it is her relaxation.  She typically smokes 2-5 cigarettes at night while she is drinking her tea and relaxing.  We discussed that most likely the appropriate goal for her at this time would be to limit the amount that she smokes no more than 3 cigarettes at night.  With time ideally this should be 2 cigarettes and at some point we may be able to get this down to 1 cigarette.

## 2019-10-18 ENCOUNTER — Other Ambulatory Visit: Payer: Self-pay

## 2019-10-18 ENCOUNTER — Other Ambulatory Visit (INDEPENDENT_AMBULATORY_CARE_PROVIDER_SITE_OTHER): Payer: 59

## 2019-10-18 ENCOUNTER — Ambulatory Visit (INDEPENDENT_AMBULATORY_CARE_PROVIDER_SITE_OTHER): Payer: 59 | Admitting: Internal Medicine

## 2019-10-18 ENCOUNTER — Encounter: Payer: Self-pay | Admitting: Internal Medicine

## 2019-10-18 VITALS — BP 138/82 | HR 72 | Temp 98.4°F | Resp 16 | Ht 63.0 in | Wt 148.0 lb

## 2019-10-18 DIAGNOSIS — F3289 Other specified depressive episodes: Secondary | ICD-10-CM | POA: Diagnosis not present

## 2019-10-18 DIAGNOSIS — Z720 Tobacco use: Secondary | ICD-10-CM

## 2019-10-18 DIAGNOSIS — E119 Type 2 diabetes mellitus without complications: Secondary | ICD-10-CM | POA: Diagnosis not present

## 2019-10-18 DIAGNOSIS — E782 Mixed hyperlipidemia: Secondary | ICD-10-CM

## 2019-10-18 DIAGNOSIS — G4733 Obstructive sleep apnea (adult) (pediatric): Secondary | ICD-10-CM

## 2019-10-18 DIAGNOSIS — K580 Irritable bowel syndrome with diarrhea: Secondary | ICD-10-CM

## 2019-10-18 LAB — COMPREHENSIVE METABOLIC PANEL
ALT: 20 U/L (ref 0–35)
AST: 17 U/L (ref 0–37)
Albumin: 4.3 g/dL (ref 3.5–5.2)
Alkaline Phosphatase: 58 U/L (ref 39–117)
BUN: 17 mg/dL (ref 6–23)
CO2: 27 mEq/L (ref 19–32)
Calcium: 9.4 mg/dL (ref 8.4–10.5)
Chloride: 104 mEq/L (ref 96–112)
Creatinine, Ser: 0.68 mg/dL (ref 0.40–1.20)
GFR: 86.91 mL/min (ref >60.00)
Glucose, Bld: 116 mg/dL — ABNORMAL HIGH (ref 70–99)
Potassium: 3.8 mEq/L (ref 3.5–5.1)
Sodium: 140 mEq/L (ref 135–145)
Total Bilirubin: 0.5 mg/dL (ref 0.2–1.2)
Total Protein: 7 g/dL (ref 6.0–8.3)

## 2019-10-18 LAB — LIPID PANEL
Cholesterol: 127 mg/dL (ref 0–200)
HDL: 41.7 mg/dL (ref >39.00)
LDL Cholesterol: 68 mg/dL (ref 0–99)
NonHDL: 85.36
Total CHOL/HDL Ratio: 3
Triglycerides: 88 mg/dL (ref 0.0–149.0)
VLDL: 17.6 mg/dL (ref 0.0–40.0)

## 2019-10-18 LAB — HEMOGLOBIN A1C: Hgb A1c MFr Bld: 5.7 % (ref 4.6–6.5)

## 2019-10-18 NOTE — Assessment & Plan Note (Signed)
Chronic Check lipid panel  Continue daily statin Regular exercise and healthy diet encouraged  

## 2019-10-18 NOTE — Assessment & Plan Note (Signed)
Chronic Controlled, stable Continue current dose of medication Lexapro 10 mg daily 

## 2019-10-18 NOTE — Assessment & Plan Note (Signed)
Chronic, controlled Viberzi daily controlling diarrhea Continue

## 2019-10-18 NOTE — Assessment & Plan Note (Signed)
Using CPAP nightly 

## 2019-10-18 NOTE — Assessment & Plan Note (Signed)
Chronic Taking Ozempic weekly without side effects She is exercising some She has gone any no sugar diet for the past 4 months She has lost weight and overall feels well Check A1c

## 2019-10-19 ENCOUNTER — Encounter: Payer: Self-pay | Admitting: Internal Medicine

## 2019-11-14 ENCOUNTER — Other Ambulatory Visit: Payer: Self-pay | Admitting: Internal Medicine

## 2019-12-20 ENCOUNTER — Other Ambulatory Visit: Payer: Self-pay | Admitting: Internal Medicine

## 2020-02-06 ENCOUNTER — Other Ambulatory Visit: Payer: Self-pay | Admitting: Internal Medicine

## 2020-03-01 DIAGNOSIS — C44712 Basal cell carcinoma of skin of right lower limb, including hip: Secondary | ICD-10-CM | POA: Diagnosis not present

## 2020-03-11 ENCOUNTER — Encounter: Payer: Self-pay | Admitting: Internal Medicine

## 2020-03-11 ENCOUNTER — Other Ambulatory Visit: Payer: Self-pay | Admitting: Internal Medicine

## 2020-03-12 ENCOUNTER — Other Ambulatory Visit: Payer: Self-pay | Admitting: Internal Medicine

## 2020-03-12 ENCOUNTER — Telehealth: Payer: Self-pay

## 2020-03-12 DIAGNOSIS — Z85828 Personal history of other malignant neoplasm of skin: Secondary | ICD-10-CM | POA: Diagnosis not present

## 2020-03-12 DIAGNOSIS — C44712 Basal cell carcinoma of skin of right lower limb, including hip: Secondary | ICD-10-CM | POA: Diagnosis not present

## 2020-03-12 NOTE — Telephone Encounter (Signed)
There is no generic.

## 2020-03-12 NOTE — Telephone Encounter (Signed)
Key: [id number]  Your information has been submitted to [place] [employer] Part D. [employer] will review the request and will issue a decision, typically within 1-3 days from your submission. You can check the updated outcome later by reopening this request.  If [employer] has not responded in 1-3 days or if you have any questions about your ePA request, please contact [place] Medicare Part D at [phone]. If you think there may be a problem with your PA request, use our live chat feature at the bottom right.

## 2020-03-13 NOTE — Telephone Encounter (Signed)
Prior authorization started on cover my meds on yesterday. Awaiting approval. Key info documented in chart yesterday.

## 2020-03-14 MED ORDER — VIBERZI 100 MG PO TABS: 1.0000 | ORAL_TABLET | Freq: Two times a day (BID) | ORAL | 0 refills | Status: DC

## 2020-03-14 NOTE — Telephone Encounter (Signed)
Ellyana Ruder (Key: [id number])  This request has been approved.  Please note any additional information provided by [employer] at the bottom of your screen.

## 2020-03-14 NOTE — Telephone Encounter (Signed)
Christina Buchanan (Key: [id number]) Viberzi 100MG  tablets   Form [employer] Electronic PA Form [phone] NCPDP)  Created 2 days ago  Sent to Plan 2 days ago  Plan Response 2 days ago  Submit Clinical Questions 2 days ago  Determination Favorable 1 day ago  Message from [place] request has been approved

## 2020-03-21 ENCOUNTER — Telehealth: Payer: Self-pay | Admitting: Internal Medicine

## 2020-03-21 ENCOUNTER — Other Ambulatory Visit: Payer: Self-pay

## 2020-03-21 DIAGNOSIS — R69 Illness, unspecified: Secondary | ICD-10-CM | POA: Diagnosis not present

## 2020-03-21 DIAGNOSIS — E119 Type 2 diabetes mellitus without complications: Secondary | ICD-10-CM | POA: Diagnosis not present

## 2020-03-21 DIAGNOSIS — Z0100 Encounter for examination of eyes and vision without abnormal findings: Secondary | ICD-10-CM | POA: Diagnosis not present

## 2020-03-21 LAB — HM DIABETES EYE EXAM

## 2020-03-21 MED ORDER — OZEMPIC (1 MG/DOSE) 2 MG/1.5ML ~~LOC~~ SOPN: PEN_INJECTOR | SUBCUTANEOUS | 2 refills | Status: DC

## 2020-03-21 NOTE — Telephone Encounter (Signed)
    Pharmacy requesting new order for [facility] The 3 pack is discontinued New order needed

## 2020-03-22 NOTE — Telephone Encounter (Signed)
Faxed in yesterday.

## 2020-03-26 ENCOUNTER — Telehealth: Payer: Self-pay

## 2020-03-26 MED ORDER — OZEMPIC (1 MG/DOSE) 4 MG/3ML ~~LOC~~ SOPN: 1.0000 mg | PEN_INJECTOR | SUBCUTANEOUS | 1 refills | Status: DC

## 2020-03-26 NOTE — Telephone Encounter (Signed)
New message   [facility] calling [facility] [employer]    Semaglutide, 1 MG/DOSE, (OZEMPIC, 1 MG/DOSE,) 2 MG/1.5ML SOPN needs to be written to 4mg  per 3 ml    NDC # [id number]   The old mediation was discontinue only come in 3 ml   [facility] [place] [place] - [address]

## 2020-03-28 DIAGNOSIS — R69 Illness, unspecified: Secondary | ICD-10-CM | POA: Diagnosis not present

## 2020-04-11 ENCOUNTER — Encounter: Payer: 59 | Admitting: Internal Medicine

## 2020-04-12 ENCOUNTER — Telehealth: Payer: Self-pay | Admitting: Internal Medicine

## 2020-04-12 NOTE — Telephone Encounter (Signed)
Needs virtual visit 

## 2020-04-12 NOTE — Telephone Encounter (Signed)
New message:    Pt is calling and would like to know if Dr. Quay Burow will send her some medication in for a sinus infection. Please advise.

## 2020-04-12 NOTE — Progress Notes (Signed)
Virtual Visit via Video Note  I connected with Christina Buchanan on 04/13/20 at 11:00 AM EDT by a video enabled telemedicine application and verified that I am speaking with the correct person using two identifiers.   I discussed the limitations of evaluation and management by telemedicine and the availability of in person appointments. The patient expressed understanding and agreed to proceed.  Present for the visit:  Myself, Dr Billey Gosling, Rhae Lerner.  The patient is currently at home and I am in the office.    No referring provider.    History of Present Illness: She is here for an acute visit for cold symptoms.   Her symptoms started more than two weeks ago  She is experiencing left side of face pain and ear pain.  She has teeth pain and her ears are full.  She denies other symptoms.  She has had a sinus infection about once a year and these are her typical symptoms.   She has tried taking advil cold and sinus, which helps    Review of Systems  Constitutional: Negative for chills and fever.  HENT: Positive for ear pain and sinus pain. Negative for sore throat.        Teeth pain, ear full  Respiratory: Negative for cough, sputum production and wheezing.   Neurological: Negative for dizziness and headaches.      Social History   Socioeconomic History  . Marital status: Married    Spouse name: Not on file  . Number of children: Not on file  . Years of education: Not on file  . Highest education level: Not on file  Occupational History  . Not on file  Tobacco Use  . Smoking status: Current Every Day Smoker    Packs/day: 0.30    Years: 30.00    Pack years: 9.00    Types: Cigarettes  . Smokeless tobacco: Never Used  . Tobacco comment: pt smokes 5 cigarettes per day. Married, lives with spouse. Cares for elderly parent and in-laws  Vaping Use  . Vaping Use: Never used  Substance and Sexual Activity  . Alcohol use: Yes    Alcohol/week: 2.0 standard drinks     Types: 2 Standard drinks or equivalent per week    Comment: Rare glass of wine every 2-3 weeks  . Drug use: No  . Sexual activity: Yes    Partners: Male    Birth control/protection: Other-see comments, Post-menopausal    Comment: husband had vasectomy  Other Topics Concern  . Not on file  Social History Narrative   Lives with husband in a one story home.  She has 3 grown children.     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     Education: high school.        No regular exercise   Social Determinants of Health   Financial Resource Strain:   . Difficulty of Paying Living Expenses:   Food Insecurity:   . Worried About Charity fundraiser in the Last Year:   . Arboriculturist in the Last Year:   Transportation Needs:   . Film/video editor (Medical):   [name] Lack of Transportation (Non-Medical):   Physical Activity:   . Days of Exercise per Week:   . Minutes of Exercise per Session:   Stress:   . Feeling of Stress :   Social Connections:   . Frequency of Communication with Friends and Family:   . Frequency of Social Gatherings with Friends and Family:   .  Attends Religious Services:   . Active Member of Clubs or Organizations:   . Attends Archivist Meetings:   [name] Marital Status:      Observations/Objective: Appears well in NAD   Assessment and Plan:  See Problem List for Assessment and Plan of chronic medical problems.   Follow Up Instructions:    I discussed the assessment and treatment plan with the patient. The patient was provided an opportunity to ask questions and all were answered. The patient agreed with the plan and demonstrated an understanding of the instructions.   The patient was advised to call back or seek an in-person evaluation if the symptoms worsen or if the condition fails to improve as anticipated.    Binnie Rail, MD

## 2020-04-13 ENCOUNTER — Encounter: Payer: Self-pay | Admitting: Internal Medicine

## 2020-04-13 ENCOUNTER — Telehealth (INDEPENDENT_AMBULATORY_CARE_PROVIDER_SITE_OTHER): Payer: Medicare HMO | Admitting: Internal Medicine

## 2020-04-13 DIAGNOSIS — J0100 Acute maxillary sinusitis, unspecified: Secondary | ICD-10-CM | POA: Diagnosis not present

## 2020-04-13 MED ORDER — AZITHROMYCIN 250 MG PO TABS: ORAL_TABLET | ORAL | 0 refills | Status: DC

## 2020-04-13 NOTE — Assessment & Plan Note (Signed)
Likely bacterial  Start zpak otc cold medications Rest, fluid Call if no improvement

## 2020-04-24 ENCOUNTER — Other Ambulatory Visit: Payer: Self-pay | Admitting: Internal Medicine

## 2020-04-30 ENCOUNTER — Encounter: Payer: Self-pay | Admitting: Internal Medicine

## 2020-05-07 NOTE — Patient Instructions (Addendum)
Blood work was ordered.    All other Health Maintenance issues reviewed.   All recommended immunizations and age-appropriate screenings are up-to-date or discussed.  No immunization administered today.   Medications reviewed and updated.  Changes include :   none    Please followup in 6 months    Health Maintenance, Female Adopting a healthy lifestyle and getting preventive care are important in promoting health and wellness. Ask your health care provider about:  The right schedule for you to have regular tests and exams.  Things you can do on your own to prevent diseases and keep yourself healthy. What should I know about diet, weight, and exercise? Eat a healthy diet   Eat a diet that includes plenty of vegetables, fruits, low-fat dairy products, and lean protein.  Do not eat a lot of foods that are high in solid fats, added sugars, or sodium. Maintain a healthy weight Body mass index (BMI) is used to identify weight problems. It estimates body fat based on height and weight. Your health care provider can help determine your BMI and help you achieve or maintain a healthy weight. Get regular exercise Get regular exercise. This is one of the most important things you can do for your health. Most adults should:  Exercise for at least 150 minutes each week. The exercise should increase your heart rate and make you sweat (moderate-intensity exercise).  Do strengthening exercises at least twice a week. This is in addition to the moderate-intensity exercise.  Spend less time sitting. Even light physical activity can be beneficial. Watch cholesterol and blood lipids Have your blood tested for lipids and cholesterol at 65 years of age, then have this test every 5 years. Have your cholesterol levels checked more often if:  Your lipid or cholesterol levels are high.  You are older than 65 years of age.  You are at high risk for heart disease. What should I know about cancer  screening? Depending on your health history and family history, you may need to have cancer screening at various ages. This may include screening for:  Breast cancer.  Cervical cancer.  Colorectal cancer.  Skin cancer.  Lung cancer. What should I know about heart disease, diabetes, and high blood pressure? Blood pressure and heart disease  High blood pressure causes heart disease and increases the risk of stroke. This is more likely to develop in people who have high blood pressure readings, are of African descent, or are overweight.  Have your blood pressure checked: ? Every 3-5 years if you are 18-39 years of age. ? Every year if you are 40 years old or older. Diabetes Have regular diabetes screenings. This checks your fasting blood sugar level. Have the screening done:  Once every three years after age 40 if you are at a normal weight and have a low risk for diabetes.  More often and at a younger age if you are overweight or have a high risk for diabetes. What should I know about preventing infection? Hepatitis B If you have a higher risk for hepatitis B, you should be screened for this virus. Talk with your health care provider to find out if you are at risk for hepatitis B infection. Hepatitis C Testing is recommended for:  Everyone born from 1945 through 1965.  Anyone with known risk factors for hepatitis C. Sexually transmitted infections (STIs)  Get screened for STIs, including gonorrhea and chlamydia, if: ? You are sexually active and are younger than 65 years of   age. ? You are older than 65 years of age and your health care provider tells you that you are at risk for this type of infection. ? Your sexual activity has changed since you were last screened, and you are at increased risk for chlamydia or gonorrhea. Ask your health care provider if you are at risk.  Ask your health care provider about whether you are at high risk for HIV. Your health care provider may  recommend a prescription medicine to help prevent HIV infection. If you choose to take medicine to prevent HIV, you should first get tested for HIV. You should then be tested every 3 months for as long as you are taking the medicine. Pregnancy  If you are about to stop having your period (premenopausal) and you may become pregnant, seek counseling before you get pregnant.  Take 400 to 800 micrograms (mcg) of folic acid every day if you become pregnant.  Ask for birth control (contraception) if you want to prevent pregnancy. Osteoporosis and menopause Osteoporosis is a disease in which the bones lose minerals and strength with aging. This can result in bone fractures. If you are 65 years old or older, or if you are at risk for osteoporosis and fractures, ask your health care provider if you should:  Be screened for bone loss.  Take a calcium or vitamin D supplement to lower your risk of fractures.  Be given hormone replacement therapy (HRT) to treat symptoms of menopause. Follow these instructions at home: Lifestyle  Do not use any products that contain nicotine or tobacco, such as cigarettes, e-cigarettes, and chewing tobacco. If you need help quitting, ask your health care provider.  Do not use street drugs.  Do not share needles.  Ask your health care provider for help if you need support or information about quitting drugs. Alcohol use  Do not drink alcohol if: ? Your health care provider tells you not to drink. ? You are pregnant, may be pregnant, or are planning to become pregnant.  If you drink alcohol: ? Limit how much you use to 0-1 drink a day. ? Limit intake if you are breastfeeding.  Be aware of how much alcohol is in your drink. In the [place] one drink equals one 12 oz bottle of beer (355 mL), one 5 oz glass of wine (148 mL), or one 1 oz glass of hard liquor (44 mL). General instructions  Schedule regular health, dental, and eye exams.  Stay current with your  vaccines.  Tell your health care provider if: ? You often feel depressed. ? You have ever been abused or do not feel safe at home. Summary  Adopting a healthy lifestyle and getting preventive care are important in promoting health and wellness.  Follow your health care provider's instructions about healthy diet, exercising, and getting tested or screened for diseases.  Follow your health care provider's instructions on monitoring your cholesterol and blood pressure. This information is not intended to replace advice given to you by your health care provider. Make sure you discuss any questions you have with your health care provider. Document Revised: 09/15/2018 Document Reviewed: 09/15/2018 Elsevier Patient Education  2020 Elsevier Inc.  

## 2020-05-07 NOTE — Progress Notes (Signed)
Subjective:    Patient ID: Christina Buchanan, female    DOB: 1956-01-08, 64 y.o.   MRN: [id number]  HPI She is here for a physical exam.   Overall she is doing well and has no concerns.  Medications and allergies reviewed with patient and updated if appropriate.  Patient Active Problem List   Diagnosis Date Noted  . Hair changes 10/08/2018  . Genetic testing 06/09/2018  . Family history of breast cancer   . IBS (irritable bowel syndrome) - D 04/05/2018  . OSA (obstructive sleep apnea) 12/15/2017  . Wears hearing aid in both ears 09/08/2017  . Hemorrhoids 09/08/2017  . Status post total abdominal hysterectomy and bilateral salpingo-oophorectomy 10/02/2016  . Diabetes ([place]) 08/12/2016  . Hyperlipidemia 04/29/2016  . Estrogen deficiency 04/29/2016  . Tobacco abuse 01/21/2010  . Depression 01/21/2010  . ALLERGIC RHINITIS 01/21/2010  . Personal history of colonic polyps 07/30/2006    Current Outpatient Medications on File Prior to Visit  Medication Sig Dispense Refill  . aspirin EC 81 MG tablet Take 1 tablet (81 mg total) by mouth daily.    [name] azelastine (ASTELIN) 0.1 % nasal spray USE IN EACH NOSTRIL AS DIRECTED 30 mL 3  . cetirizine (ZYRTEC) 10 MG tablet Take 10 mg by mouth daily as needed for allergies.     . Eluxadoline (VIBERZI) 100 MG TABS Take 1 tablet (100 mg total) by mouth 2 (two) times daily. 180 tablet 0  . escitalopram (LEXAPRO) 10 MG tablet Take 1 tablet (10 mg total) by mouth daily. 90 tablet 3  . Semaglutide, 1 MG/DOSE, (OZEMPIC, 1 MG/DOSE,) 4 MG/3ML SOPN Inject 0.75 mLs (1 mg total) into the skin once a week. 9 mL 1  . simvastatin (ZOCOR) 20 MG tablet TAKE 1 TABLET (20 MG TOTAL) BY MOUTH DAILY AT 6 PM. 90 tablet 3  . triamcinolone (KENALOG) 0.025 % ointment Apply 1 application topically 2 (two) times daily. Use for one to two weeks at a time. 30 g 1   No current facility-administered medications on file prior to visit.    Past Medical History:  Diagnosis  Date  . ALLERGIC RHINITIS   . Arthritis    hands  . DEPRESSION   . Diabetes mellitus without complication ([place]) 0962   diet controlled  . Family history of breast cancer   . Fibroid, uterine 04/29/2016  . HYPERLIPIDEMIA   . OSA (obstructive sleep apnea) 12/15/2017  . Osteopenia   . Personal history of colonic polyps   . SMOKER   . VITAMIN D DEFICIENCY     Past Surgical History:  Procedure Laterality Date  . ABDOMINAL HYSTERECTOMY N/A 10/02/2016   Procedure: HYSTERECTOMY ABDOMINAL;  Surgeon: Nunzio Cobbs, MD;  Location: [facility];  Service: Gynecology;  Laterality: N/A;  4 hours  . ABDOMINAL SACROCOLPOPEXY N/A 10/02/2016   Procedure: ABDOMINO SACROCOLPOPEXY;  Surgeon: Nunzio Cobbs, MD;  Location: [facility];  Service: Gynecology;  Laterality: N/A;  . ANTERIOR AND POSTERIOR REPAIR N/A 10/02/2016   Procedure: ANTERIOR (CYSTOCELE) AND POSTERIOR REPAIR (RECTOCELE);  Surgeon: Nunzio Cobbs, MD;  Location: [facility];  Service: Gynecology;  Laterality: N/A;  . BLADDER SUSPENSION N/A 10/02/2016   Procedure: TRANSVAGINAL TAPE (TVT) PROCEDURE exact midurethral sling;  Surgeon: Nunzio Cobbs, MD;  Location: [facility];  Service: Gynecology;  Laterality: N/A;  . BREAST LUMPECTOMY WITH RADIOACTIVE SEED LOCALIZATION Left 09/08/2019   Procedure: LEFT BREAST LUMPECTOMY WITH RADIOACTIVE SEED LOCALIZATION;  Surgeon: Coralie Keens, MD;  Location: [facility];  Service: General;  Laterality: Left;  . BREAST SURGERY  1990   breast biopsy  . COLONOSCOPY W/ BIOPSIES  9/07   2 polyps & tubuvillous adenoma  . COLONOSCOPY W/ BIOPSIES  11/13/08   sigmoid ddiverticuli and rechek in 5 years - Dr. Collene Mares  . CYSTOSCOPY N/A 10/02/2016   Procedure: CYSTOSCOPY;  Surgeon: Nunzio Cobbs, MD;  Location: [facility];  Service: Gynecology;  Laterality: N/A;  . ENDOMETRIAL ABLATION  08/21/03   HTA  . 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 SALPINGOOPHORECTOMY Bilateral 10/02/2016   Procedure: SALPINGO OOPHORECTOMY;  Surgeon: Nunzio Cobbs, MD;  Location: [facility];  Service: Gynecology;  Laterality: Bilateral;  . UPPER GASTROINTESTINAL ENDOSCOPY  06/15/06   2 polyps - H pylori    Social History   Socioeconomic History  . Marital status: Married    Spouse name: Not on file  . Number of children: Not on file  . Years of education: Not on file  . Highest education level: Not on file  Occupational History  . Not on file  Tobacco Use  . Smoking status: Current Every Day Smoker    Packs/day: 0.30    Years: 30.00    Pack years: 9.00    Types: Cigarettes  . Smokeless tobacco: Never Used  . Tobacco comment: pt smokes 5 cigarettes per day. Married, lives with spouse. Cares for elderly parent and in-laws  Vaping Use  . Vaping Use: Never used  Substance and Sexual Activity  . Alcohol use: Yes    Alcohol/week: 2.0 standard drinks    Types: 2 Standard drinks or equivalent per week    Comment: Rare glass of wine every 2-3 weeks  . Drug use: No  . Sexual activity: Yes    Partners: Male    Birth control/protection: Other-see comments, Post-menopausal    Comment: husband had vasectomy  Other Topics Concern  . Not on file  Social History Narrative   Lives with husband in a one story home.  She has 3 grown children.     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     Education: high school.        No regular exercise   Social Determinants of Health   Financial Resource Strain:   . Difficulty of Paying Living Expenses:   Food Insecurity:   . Worried About Charity fundraiser in the Last Year:   . Arboriculturist in the Last Year:   Transportation Needs:   . Film/video editor (Medical):   [name] Lack of Transportation (Non-Medical):   Physical Activity:   . Days of Exercise per Week:   . Minutes of Exercise per Session:   Stress:   . Feeling of Stress :   Social Connections:   .  Frequency of Communication with Friends and Family:   . Frequency of Social Gatherings with Friends and Family:   . Attends Religious Services:   . Active Member of Clubs or Organizations:   . Attends Archivist Meetings:   [name] Marital Status:     Family History  Problem Relation Age of Onset  . Breast cancer Mother 58  . Diabetes Mother   . Stroke Mother               . Hyperlipidemia Mother   . Heart failure Mother   .  Hypertension Mother   . COPD Mother   . Hypertension Father   . Heart disease Father   . Hyperlipidemia Father   . Heart failure Father        dec at age 51  . Breast cancer Sister 62  . Heart disease Sister        Afib  . Psoriasis Son     Review of Systems  Constitutional: Negative for chills and fever.  Eyes: Negative for visual disturbance.  Respiratory: Negative for cough, shortness of breath and wheezing.   Cardiovascular: Negative for chest pain, palpitations and leg swelling.  Gastrointestinal: Negative for abdominal pain, blood in stool, constipation, diarrhea and nausea.       No gerd  Genitourinary: Negative for dysuria and hematuria.  Musculoskeletal: Negative for arthralgias and back pain.  Skin: Negative for rash.  Neurological: Negative for light-headedness and headaches.  Psychiatric/Behavioral: Negative for dysphoric mood. The patient is not nervous/anxious.        Objective:   Vitals:   05/08/20 1543  BP: 134/72  Pulse: 83  Temp: 98 F (36.7 C)  SpO2: 96%   Filed Weights   05/08/20 1543  Weight: 147 lb (66.7 kg)   Body mass index is 26.04 kg/m.  BP Readings from Last 3 Encounters:  05/08/20 134/72  10/18/19 138/82  09/08/19 128/72    Wt Readings from Last 3 Encounters:  05/08/20 147 lb (66.7 kg)  10/18/19 148 lb (67.1 kg)  09/08/19 147 lb 7.8 oz (66.9 kg)     Physical Exam Constitutional: She appears well-developed and well-nourished. No distress.  HENT:  Head: Normocephalic and atraumatic.  Right  Ear: External ear normal. Normal ear canal and TM Left Ear: External ear normal.  Normal ear canal and TM Mouth/Throat: Oropharynx is clear and moist.  Eyes: Conjunctivae and EOM are normal.  Neck: Neck supple. No tracheal deviation present. No thyromegaly present.  No carotid bruit  Cardiovascular: Normal rate, regular rhythm and normal heart sounds.   No murmur heard.  No edema. Pulmonary/Chest: Effort normal and breath sounds normal. No respiratory distress. She has no wheezes. She has no rales.  Breast: deferred   Abdominal: Soft. She exhibits no distension. There is no tenderness.  Lymphadenopathy: She has no cervical adenopathy.  Skin: Skin is warm and dry. She is not diaphoretic.  Psychiatric: She has a normal mood and affect. Her behavior is normal.        Assessment & Plan:   Physical exam: Screening blood work    ordered Immunizations  had Covid, declined prevnar Colonoscopy  Up to date  Mammogram  Up to date  Gyn  Dr Quincy Simmonds  Dexa  Up to date  Eye exams  Up to date  - [facility] Exercise  walking Weight  Weight is good for age Substance abuse  smoking  See Problem List for Assessment and Plan of chronic medical problems.   This visit occurred during the SARS-CoV-2 public health emergency.  Safety protocols were in place, including screening questions prior to the visit, additional usage of staff PPE, and extensive cleaning of exam room while observing appropriate contact time as indicated for disinfecting solutions.

## 2020-05-08 ENCOUNTER — Encounter: Payer: Self-pay | Admitting: Internal Medicine

## 2020-05-08 ENCOUNTER — Other Ambulatory Visit: Payer: Self-pay

## 2020-05-08 ENCOUNTER — Ambulatory Visit (INDEPENDENT_AMBULATORY_CARE_PROVIDER_SITE_OTHER): Payer: Medicare HMO | Admitting: Internal Medicine

## 2020-05-08 VITALS — BP 134/72 | HR 83 | Temp 98.0°F | Ht 63.0 in | Wt 147.0 lb

## 2020-05-08 DIAGNOSIS — K580 Irritable bowel syndrome with diarrhea: Secondary | ICD-10-CM

## 2020-05-08 DIAGNOSIS — F3289 Other specified depressive episodes: Secondary | ICD-10-CM | POA: Diagnosis not present

## 2020-05-08 DIAGNOSIS — E782 Mixed hyperlipidemia: Secondary | ICD-10-CM

## 2020-05-08 DIAGNOSIS — G4733 Obstructive sleep apnea (adult) (pediatric): Secondary | ICD-10-CM

## 2020-05-08 DIAGNOSIS — Z0000 Encounter for general adult medical examination without abnormal findings: Secondary | ICD-10-CM

## 2020-05-08 DIAGNOSIS — R69 Illness, unspecified: Secondary | ICD-10-CM | POA: Diagnosis not present

## 2020-05-08 DIAGNOSIS — Z720 Tobacco use: Secondary | ICD-10-CM

## 2020-05-08 DIAGNOSIS — E119 Type 2 diabetes mellitus without complications: Secondary | ICD-10-CM | POA: Diagnosis not present

## 2020-05-08 NOTE — Assessment & Plan Note (Signed)
Chronic Very well controlled with Viberzi-this is the only thing that has worked for her Continue 100 mg twice daily

## 2020-05-08 NOTE — Assessment & Plan Note (Signed)
Using CPAP nightly 

## 2020-05-08 NOTE — Assessment & Plan Note (Signed)
Chronic Smoking 1 cigarette/day Has no desire to give it up at this time

## 2020-05-08 NOTE — Assessment & Plan Note (Signed)
Chronic Check lipid panel, CMP Continue statin Continue regular exercise and healthy diet

## 2020-05-08 NOTE — Assessment & Plan Note (Signed)
Chronic On Ozempic Very well controlled Check A1c, urine microalbumin Continue regular exercise and diabetic diet

## 2020-05-08 NOTE — Assessment & Plan Note (Signed)
Chronic Controlled, stable Continue current dose of medication  

## 2020-05-10 ENCOUNTER — Other Ambulatory Visit (INDEPENDENT_AMBULATORY_CARE_PROVIDER_SITE_OTHER): Payer: Medicare HMO

## 2020-05-10 DIAGNOSIS — E119 Type 2 diabetes mellitus without complications: Secondary | ICD-10-CM | POA: Diagnosis not present

## 2020-05-10 DIAGNOSIS — Z0000 Encounter for general adult medical examination without abnormal findings: Secondary | ICD-10-CM | POA: Diagnosis not present

## 2020-05-10 DIAGNOSIS — E782 Mixed hyperlipidemia: Secondary | ICD-10-CM | POA: Diagnosis not present

## 2020-05-10 DIAGNOSIS — E1110 Type 2 diabetes mellitus with ketoacidosis without coma: Secondary | ICD-10-CM

## 2020-05-10 LAB — COMPREHENSIVE METABOLIC PANEL
ALT: 18 U/L (ref 0–35)
AST: 16 U/L (ref 0–37)
Albumin: 4.2 g/dL (ref 3.5–5.2)
Alkaline Phosphatase: 49 U/L (ref 39–117)
BUN: 15 mg/dL (ref 6–23)
CO2: 27 mEq/L (ref 19–32)
Calcium: 8.9 mg/dL (ref 8.4–10.5)
Chloride: 107 mEq/L (ref 96–112)
Creatinine, Ser: 0.65 mg/dL (ref 0.40–1.20)
GFR: 91.4 mL/min (ref >60.00)
Glucose, Bld: 119 mg/dL — ABNORMAL HIGH (ref 70–99)
Potassium: 4 mEq/L (ref 3.5–5.1)
Sodium: 140 mEq/L (ref 135–145)
Total Bilirubin: 0.4 mg/dL (ref 0.2–1.2)
Total Protein: 6.8 g/dL (ref 6.0–8.3)

## 2020-05-10 LAB — LIPID PANEL
Cholesterol: 125 mg/dL (ref 0–200)
HDL: 44.5 mg/dL (ref >39.00)
LDL Cholesterol: 69 mg/dL (ref 0–99)
NonHDL: 80.76
Total CHOL/HDL Ratio: 3
Triglycerides: 61 mg/dL (ref 0.0–149.0)
VLDL: 12.2 mg/dL (ref 0.0–40.0)

## 2020-05-10 LAB — MICROALBUMIN / CREATININE URINE RATIO
Creatinine,U: 74.5 mg/dL
Microalb Creat Ratio: 1.3 mg/g (ref 0.0–30.0)
Microalb, Ur: 1 mg/dL (ref 0.0–1.9)

## 2020-05-10 LAB — CBC WITH DIFFERENTIAL/PLATELET
Basophils Absolute: 0 10*3/uL (ref 0.0–0.1)
Basophils Relative: 0.5 % (ref 0.0–3.0)
Eosinophils Absolute: 0.1 10*3/uL (ref 0.0–0.7)
Eosinophils Relative: 1.9 % (ref 0.0–5.0)
HCT: 39.7 % (ref 36.0–46.0)
Hemoglobin: 13.6 g/dL (ref 12.0–15.0)
Lymphocytes Relative: 31.5 % (ref 12.0–46.0)
Lymphs Abs: 2 10*3/uL (ref 0.7–4.0)
MCHC: 34.4 g/dL (ref 30.0–36.0)
MCV: 94.1 fl (ref 78.0–100.0)
Monocytes Absolute: 0.5 10*3/uL (ref 0.1–1.0)
Monocytes Relative: 8.6 % (ref 3.0–12.0)
Neutro Abs: 3.6 10*3/uL (ref 1.4–7.7)
Neutrophils Relative %: 57.5 % (ref 43.0–77.0)
Platelets: 239 10*3/uL (ref 150.0–400.0)
RBC: 4.22 Mil/uL (ref 3.87–5.11)
RDW: 12.4 % (ref 11.5–15.5)
WBC: 6.3 10*3/uL (ref 4.0–10.5)

## 2020-05-10 LAB — TSH: TSH: 1.43 u[IU]/mL (ref 0.35–4.50)

## 2020-05-10 LAB — HEMOGLOBIN A1C: Hgb A1c MFr Bld: 5.8 % (ref 4.6–6.5)

## 2020-05-22 DIAGNOSIS — R69 Illness, unspecified: Secondary | ICD-10-CM | POA: Diagnosis not present

## 2020-05-25 ENCOUNTER — Telehealth: Payer: Self-pay | Admitting: Internal Medicine

## 2020-05-25 ENCOUNTER — Other Ambulatory Visit: Payer: Self-pay

## 2020-05-25 MED ORDER — VIBERZI 100 MG PO TABS: 1.0000 | ORAL_TABLET | Freq: Two times a day (BID) | ORAL | 0 refills | Status: DC

## 2020-05-25 NOTE — Telephone Encounter (Signed)
Script faxed in today to pharmacy.

## 2020-05-25 NOTE — Telephone Encounter (Signed)
New message:   Pt is calling and states the pharmacy needs a call before they can refill her Eluxadoline (VIBERZI) 100 MG TABS. She states they won't fill it without speaking to someone due to her switching pharmacies. Please advise.     [place], [place]

## 2020-06-19 ENCOUNTER — Encounter: Payer: Self-pay | Admitting: Internal Medicine

## 2020-07-01 ENCOUNTER — Encounter: Payer: Self-pay | Admitting: Internal Medicine

## 2020-07-01 DIAGNOSIS — R200 Anesthesia of skin: Secondary | ICD-10-CM

## 2020-07-17 DIAGNOSIS — M79641 Pain in right hand: Secondary | ICD-10-CM | POA: Diagnosis not present

## 2020-07-17 DIAGNOSIS — G5601 Carpal tunnel syndrome, right upper limb: Secondary | ICD-10-CM | POA: Diagnosis not present

## 2020-07-19 DIAGNOSIS — G4733 Obstructive sleep apnea (adult) (pediatric): Secondary | ICD-10-CM | POA: Diagnosis not present

## 2020-07-30 NOTE — Progress Notes (Deleted)
64 y.o. G60P3003 Married Caucasian female here for annual exam.    PCP:     Patient's last menstrual period was 03/06/2006 (approximate).           Sexually active: {yes no:314532}  The current method of family planning is status post hysterectomy.    Exercising: {yes no:314532}  {types:19826} Smoker:  {YES NO:22349}  Health Maintenance: Pap: 02-17-16 Neg, 04-23-15 Neg:Neg HR HPV History of abnormal Pap:  Unsure--had PMB MMG: 07-07-19 Diag.Lt.Br.--see Epic Colonoscopy:*** 09/24/2015, Tubular Adenoma, repeat in 5 years BMD: ***04-09-18  Result :Osteopenia TDaP:  02-17-12 Gardasil:   no HIV: 04-29-16 NR Hep C: 10-04-15 Neg Screening Labs:  Hb today: ***, Urine today: ***   reports that she has been smoking cigarettes. She has a 9.00 pack-year smoking history. She has never used smokeless tobacco. She reports current alcohol use of about 2.0 standard drinks of alcohol per week. She reports that she does not use drugs.  Past Medical History:  Diagnosis Date  . ALLERGIC RHINITIS   . Arthritis    hands  . DEPRESSION   . Diabetes mellitus without complication ([place]) 8841   diet controlled  . Family history of breast cancer   . Fibroid, uterine 04/29/2016  . HYPERLIPIDEMIA   . OSA (obstructive sleep apnea) 12/15/2017  . Osteopenia   . Personal history of colonic polyps   . SMOKER   . VITAMIN D DEFICIENCY     Past Surgical History:  Procedure Laterality Date  . ABDOMINAL HYSTERECTOMY N/A 10/02/2016   Procedure: HYSTERECTOMY ABDOMINAL;  Surgeon: Nunzio Cobbs, MD;  Location: [facility];  Service: Gynecology;  Laterality: N/A;  4 hours  . ABDOMINAL SACROCOLPOPEXY N/A 10/02/2016   Procedure: ABDOMINO SACROCOLPOPEXY;  Surgeon: Nunzio Cobbs, MD;  Location: [facility];  Service: Gynecology;  Laterality: N/A;  . ANTERIOR AND POSTERIOR REPAIR N/A 10/02/2016   Procedure: ANTERIOR (CYSTOCELE) AND POSTERIOR REPAIR (RECTOCELE);  Surgeon: Nunzio Cobbs, MD;  Location: [facility];  Service: Gynecology;  Laterality: N/A;  . BLADDER SUSPENSION N/A 10/02/2016   Procedure: TRANSVAGINAL TAPE (TVT) PROCEDURE exact midurethral sling;  Surgeon: Nunzio Cobbs, MD;  Location: [facility];  Service: Gynecology;  Laterality: N/A;  . BREAST LUMPECTOMY WITH RADIOACTIVE SEED LOCALIZATION Left 09/08/2019   Procedure: LEFT BREAST LUMPECTOMY WITH RADIOACTIVE SEED LOCALIZATION;  Surgeon: Coralie Keens, MD;  Location: [facility];  Service: General;  Laterality: Left;  . BREAST SURGERY  1990   breast biopsy  . COLONOSCOPY W/ BIOPSIES  9/07   2 polyps & tubuvillous adenoma  . COLONOSCOPY W/ BIOPSIES  11/13/08   sigmoid ddiverticuli and rechek in 5 years - Dr. Collene Mares  . CYSTOSCOPY N/A 10/02/2016   Procedure: CYSTOSCOPY;  Surgeon: Nunzio Cobbs, MD;  Location: [facility];  Service: Gynecology;  Laterality: N/A;  . ENDOMETRIAL ABLATION  08/21/03   HTA  . 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 SALPINGOOPHORECTOMY Bilateral 10/02/2016   Procedure: SALPINGO OOPHORECTOMY;  Surgeon: Nunzio Cobbs, MD;  Location: [facility];  Service: Gynecology;  Laterality: Bilateral;  . UPPER GASTROINTESTINAL ENDOSCOPY  06/15/06   2 polyps - H pylori    Current Outpatient Medications  Medication Sig Dispense Refill  . aspirin EC 81 MG tablet Take 1 tablet (81 mg total) by mouth daily.    [name] azelastine (ASTELIN) 0.1 % nasal spray USE IN [facility]  NOSTRIL AS DIRECTED 30 mL 3  . cetirizine (ZYRTEC) 10 MG tablet Take 10 mg by mouth daily as needed for allergies.     . Eluxadoline (VIBERZI) 100 MG TABS Take 1 tablet (100 mg total) by mouth 2 (two) times daily. 180 tablet 0  . escitalopram (LEXAPRO) 10 MG tablet Take 1 tablet (10 mg total) by mouth daily. 90 tablet 3  . Semaglutide, 1 MG/DOSE, (OZEMPIC, 1 MG/DOSE,) 4 MG/3ML SOPN Inject 0.75 mLs (1 mg total) into the skin once a week. 9 mL 1  . simvastatin (ZOCOR) 20 MG  tablet TAKE 1 TABLET (20 MG TOTAL) BY MOUTH DAILY AT 6 PM. 90 tablet 3  . triamcinolone (KENALOG) 0.025 % ointment Apply 1 application topically 2 (two) times daily. Use for one to two weeks at a time. 30 g 1   No current facility-administered medications for this visit.    Family History  Problem Relation Age of Onset  . Breast cancer Mother 46  . Diabetes Mother   . Stroke Mother               . Hyperlipidemia Mother   . Heart failure Mother   . Hypertension Mother   . COPD Mother   . Hypertension Father   . Heart disease Father   . Hyperlipidemia Father   . Heart failure Father        dec at age 66  . Breast cancer Sister 25  . Heart disease Sister        Afib  . Psoriasis Son     Review of Systems  Exam:   LMP 03/06/2006 (Approximate)     General appearance: alert, cooperative and appears stated age Head: normocephalic, without obvious abnormality, atraumatic Neck: no adenopathy, supple, symmetrical, trachea midline and thyroid normal to inspection and palpation Lungs: clear to auscultation bilaterally Breasts: normal appearance, no masses or tenderness, No nipple retraction or dimpling, No nipple discharge or bleeding, No axillary adenopathy Heart: regular rate and rhythm Abdomen: soft, non-tender; no masses, no organomegaly Extremities: extremities normal, atraumatic, no cyanosis or edema Skin: skin color, texture, turgor normal. No rashes or lesions Lymph nodes: cervical, supraclavicular, and axillary nodes normal. Neurologic: grossly normal  Pelvic: External genitalia:  no lesions              No abnormal inguinal nodes palpated.              Urethra:  normal appearing urethra with no masses, tenderness or lesions              Bartholins and Skenes: normal                 Vagina: normal appearing vagina with normal color and discharge, no lesions              Cervix: no lesions              Pap taken: {yes no:314532} Bimanual Exam:  Uterus:  normal size,  contour, position, consistency, mobility, non-tender              Adnexa: no mass, fullness, tenderness              Rectal exam: {yes no:314532}.  Confirms.              Anus:  normal sphincter tone, no lesions  Chaperone was present for exam.  Assessment:   Well woman visit with normal exam.   Plan: Mammogram screening discussed. Self breast awareness  reviewed. Pap and HR HPV as above. Guidelines for Calcium, Vitamin D, regular exercise program including cardiovascular and weight bearing exercise.   Follow up annually and prn.   Additional counseling given.  {yes [id number]. _______ minutes face to face time of which over 50% was spent in counseling.    After visit summary provided.

## 2020-08-01 ENCOUNTER — Ambulatory Visit: Payer: Medicare HMO | Admitting: Obstetrics and Gynecology

## 2020-08-03 NOTE — Telephone Encounter (Signed)
Dr. Sood, please see mychart message sent by pt and advise. °

## 2020-08-04 DIAGNOSIS — G5601 Carpal tunnel syndrome, right upper limb: Secondary | ICD-10-CM | POA: Diagnosis not present

## 2020-08-06 NOTE — Telephone Encounter (Signed)
Dr. Halford Chessman, please see new mychart message from pt and advise.

## 2020-08-10 ENCOUNTER — Other Ambulatory Visit: Payer: Self-pay | Admitting: Pulmonary Disease

## 2020-08-10 DIAGNOSIS — G4733 Obstructive sleep apnea (adult) (pediatric): Secondary | ICD-10-CM

## 2020-08-16 DIAGNOSIS — G5601 Carpal tunnel syndrome, right upper limb: Secondary | ICD-10-CM | POA: Diagnosis not present

## 2020-08-22 ENCOUNTER — Other Ambulatory Visit: Payer: Self-pay | Admitting: Internal Medicine

## 2020-08-28 DIAGNOSIS — M79641 Pain in right hand: Secondary | ICD-10-CM | POA: Diagnosis not present

## 2020-09-11 DIAGNOSIS — J343 Hypertrophy of nasal turbinates: Secondary | ICD-10-CM | POA: Diagnosis not present

## 2020-09-11 DIAGNOSIS — R69 Illness, unspecified: Secondary | ICD-10-CM | POA: Diagnosis not present

## 2020-09-11 DIAGNOSIS — K1379 Other lesions of oral mucosa: Secondary | ICD-10-CM | POA: Diagnosis not present

## 2020-09-11 DIAGNOSIS — G4733 Obstructive sleep apnea (adult) (pediatric): Secondary | ICD-10-CM | POA: Diagnosis not present

## 2020-09-11 DIAGNOSIS — J342 Deviated nasal septum: Secondary | ICD-10-CM | POA: Insufficient documentation

## 2020-09-11 DIAGNOSIS — K148 Other diseases of tongue: Secondary | ICD-10-CM | POA: Diagnosis not present

## 2020-09-11 DIAGNOSIS — Z9989 Dependence on other enabling machines and devices: Secondary | ICD-10-CM | POA: Diagnosis not present

## 2020-09-12 DIAGNOSIS — D1801 Hemangioma of skin and subcutaneous tissue: Secondary | ICD-10-CM | POA: Diagnosis not present

## 2020-09-12 DIAGNOSIS — Z85828 Personal history of other malignant neoplasm of skin: Secondary | ICD-10-CM | POA: Diagnosis not present

## 2020-09-12 DIAGNOSIS — D225 Melanocytic nevi of trunk: Secondary | ICD-10-CM | POA: Diagnosis not present

## 2020-09-12 DIAGNOSIS — D2261 Melanocytic nevi of right upper limb, including shoulder: Secondary | ICD-10-CM | POA: Diagnosis not present

## 2020-09-12 DIAGNOSIS — L821 Other seborrheic keratosis: Secondary | ICD-10-CM | POA: Diagnosis not present

## 2020-09-12 DIAGNOSIS — L814 Other melanin hyperpigmentation: Secondary | ICD-10-CM | POA: Diagnosis not present

## 2020-09-20 ENCOUNTER — Encounter: Payer: Self-pay | Admitting: Internal Medicine

## 2020-09-21 ENCOUNTER — Other Ambulatory Visit: Payer: Self-pay

## 2020-09-21 ENCOUNTER — Emergency Department (HOSPITAL_BASED_OUTPATIENT_CLINIC_OR_DEPARTMENT_OTHER): Payer: Medicare HMO

## 2020-09-21 ENCOUNTER — Emergency Department (HOSPITAL_BASED_OUTPATIENT_CLINIC_OR_DEPARTMENT_OTHER)
Admission: EM | Admit: 2020-09-21 | Discharge: 2020-09-21 | Disposition: A | Discharge status: Home / Self Care | Payer: Medicare HMO | Attending: Emergency Medicine | Admitting: Emergency Medicine

## 2020-09-21 ENCOUNTER — Ambulatory Visit: Payer: Medicare HMO | Admitting: Obstetrics and Gynecology

## 2020-09-21 ENCOUNTER — Other Ambulatory Visit: Payer: Self-pay | Admitting: Internal Medicine

## 2020-09-21 DIAGNOSIS — E119 Type 2 diabetes mellitus without complications: Secondary | ICD-10-CM | POA: Diagnosis not present

## 2020-09-21 DIAGNOSIS — J90 Pleural effusion, not elsewhere classified: Secondary | ICD-10-CM | POA: Diagnosis not present

## 2020-09-21 DIAGNOSIS — R059 Cough, unspecified: Secondary | ICD-10-CM | POA: Diagnosis not present

## 2020-09-21 DIAGNOSIS — Z7982 Long term (current) use of aspirin: Secondary | ICD-10-CM | POA: Diagnosis not present

## 2020-09-21 DIAGNOSIS — U071 COVID-19: Secondary | ICD-10-CM | POA: Diagnosis not present

## 2020-09-21 DIAGNOSIS — F17210 Nicotine dependence, cigarettes, uncomplicated: Secondary | ICD-10-CM | POA: Insufficient documentation

## 2020-09-21 DIAGNOSIS — R69 Illness, unspecified: Secondary | ICD-10-CM | POA: Diagnosis not present

## 2020-09-21 DIAGNOSIS — J029 Acute pharyngitis, unspecified: Secondary | ICD-10-CM | POA: Diagnosis present

## 2020-09-21 LAB — RESP PANEL BY RT-PCR (FLU A&B, COVID) ARPGX2
Influenza A by PCR: NEGATIVE
Influenza B by PCR: NEGATIVE
SARS Coronavirus 2 by RT PCR: POSITIVE — AB

## 2020-09-21 MED ORDER — SODIUM CHLORIDE 0.9 % IV SOLN
1200.0000 mg | Freq: Once | INTRAVENOUS | Status: AC
  Administered 2020-09-21: 1200 mg via INTRAVENOUS
  Filled 2020-09-21: qty 10

## 2020-09-21 MED ORDER — FAMOTIDINE IN NACL 20-0.9 MG/50ML-% IV SOLN: 20.0000 mg | Freq: Once | INTRAVENOUS | Status: DC | PRN

## 2020-09-21 MED ORDER — METHYLPREDNISOLONE SODIUM SUCC 125 MG IJ SOLR: 125.0000 mg | Freq: Once | INTRAMUSCULAR | Status: DC | PRN

## 2020-09-21 MED ORDER — DIPHENHYDRAMINE HCL 50 MG/ML IJ SOLN: 50.0000 mg | Freq: Once | INTRAMUSCULAR | Status: DC | PRN

## 2020-09-21 MED ORDER — VIBERZI 100 MG PO TABS: 1.0000 | ORAL_TABLET | Freq: Two times a day (BID) | ORAL | 1 refills | Status: DC

## 2020-09-21 MED ORDER — EPINEPHRINE 0.3 MG/0.3ML IJ SOAJ: 0.3000 mg | Freq: Once | INTRAMUSCULAR | Status: DC | PRN

## 2020-09-21 MED ORDER — SODIUM CHLORIDE 0.9 % IV SOLN
INTRAVENOUS | Status: DC | PRN
  Administered 2020-09-21: 250 mL via INTRAVENOUS

## 2020-09-21 MED ORDER — ALBUTEROL SULFATE HFA 108 (90 BASE) MCG/ACT IN AERS: 2.0000 | INHALATION_SPRAY | Freq: Once | RESPIRATORY_TRACT | Status: DC | PRN

## 2020-09-21 NOTE — ED Provider Notes (Signed)
[facility] EMERGENCY DEPARTMENT Provider Note   CSN: [id number] Arrival date & time: 09/21/20  1552     History Chief Complaint  Patient presents with  . Sore Throat    COVID +    Christina Buchanan is a 64 y.o. female with history of diabetes, hyperlipidemia, OSA, smoker, arthritis, IBS.  Patient sent in by physician today for monoclonal antibody infusion.  Patient has been experiencing rhinorrhea, cough, congestion, body aches and sore throat x4 days.  She reports she was tested positive for COVID-19 yesterday.  Her husband also tested positive for COVID-19 and received antibody infusion today.  Patient rates her symptoms as mild but worsening she reports cough as nonproductive without associated chest pain or shortness of breath.  Sore throat is a mild scratchy sensation nonradiating no aggravating factors which is improved with rest.  Denies fever, chills, headache, vision changes, neck stiffness, hemoptysis, chest pain/shortness of breath, abdominal pain, nausea/vomiting, diarrhea, extremity swelling/color change or any additional concerns. HPI     Past Medical History:  Diagnosis Date  . ALLERGIC RHINITIS   . Arthritis    hands  . DEPRESSION   . Diabetes mellitus without complication ([place]) 4166   diet controlled  . Family history of breast cancer   . Fibroid, uterine 04/29/2016  . HYPERLIPIDEMIA   . OSA (obstructive sleep apnea) 12/15/2017  . Osteopenia   . Personal history of colonic polyps   . SMOKER   . VITAMIN D DEFICIENCY     Patient Active Problem List   Diagnosis Date Noted  . Hair changes 10/08/2018  . Genetic testing 06/09/2018  . Family history of breast cancer   . IBS (irritable bowel syndrome) - D 04/05/2018  . OSA (obstructive sleep apnea) 12/15/2017  . Wears hearing aid in both ears 09/08/2017  . Hemorrhoids 09/08/2017  . Status post total abdominal hysterectomy and bilateral salpingo-oophorectomy 10/02/2016  . Diabetes ([place])  08/12/2016  . Hyperlipidemia 04/29/2016  . Tobacco abuse 01/21/2010  . Depression 01/21/2010  . ALLERGIC RHINITIS 01/21/2010  . Personal history of colonic polyps 07/30/2006    Past Surgical History:  Procedure Laterality Date  . ABDOMINAL HYSTERECTOMY N/A 10/02/2016   Procedure: HYSTERECTOMY ABDOMINAL;  Surgeon: Nunzio Cobbs, MD;  Location: [facility];  Service: Gynecology;  Laterality: N/A;  4 hours  . ABDOMINAL SACROCOLPOPEXY N/A 10/02/2016   Procedure: ABDOMINO SACROCOLPOPEXY;  Surgeon: Nunzio Cobbs, MD;  Location: [facility];  Service: Gynecology;  Laterality: N/A;  . ANTERIOR AND POSTERIOR REPAIR N/A 10/02/2016   Procedure: ANTERIOR (CYSTOCELE) AND POSTERIOR REPAIR (RECTOCELE);  Surgeon: Nunzio Cobbs, MD;  Location: [facility];  Service: Gynecology;  Laterality: N/A;  . BLADDER SUSPENSION N/A 10/02/2016   Procedure: TRANSVAGINAL TAPE (TVT) PROCEDURE exact midurethral sling;  Surgeon: Nunzio Cobbs, MD;  Location: [facility];  Service: Gynecology;  Laterality: N/A;  . BREAST LUMPECTOMY WITH RADIOACTIVE SEED LOCALIZATION Left 09/08/2019   Procedure: LEFT BREAST LUMPECTOMY WITH RADIOACTIVE SEED LOCALIZATION;  Surgeon: Coralie Keens, MD;  Location: [facility];  Service: General;  Laterality: Left;  . BREAST SURGERY  1990   breast biopsy  . COLONOSCOPY W/ BIOPSIES  9/07   2 polyps & tubuvillous adenoma  . COLONOSCOPY W/ BIOPSIES  11/13/08   sigmoid ddiverticuli and rechek in 5 years - Dr. Collene Mares  . CYSTOSCOPY N/A 10/02/2016   Procedure: CYSTOSCOPY;  Surgeon: Nunzio Cobbs, MD;  Location: [facility];  Service: Gynecology;  Laterality: N/A;  . ENDOMETRIAL ABLATION  08/21/03   HTA  . 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 SALPINGOOPHORECTOMY Bilateral 10/02/2016   Procedure: SALPINGO OOPHORECTOMY;  Surgeon: Nunzio Cobbs, MD;  Location: [facility];  Service:  Gynecology;  Laterality: Bilateral;  . UPPER GASTROINTESTINAL ENDOSCOPY  06/15/06   2 polyps - H pylori     OB History    Gravida  3   Para  3   Term  3   Preterm  0   AB  0   Living  3     SAB  0   IAB  0   Ectopic  0   Multiple  0   Live Births  3           Family History  Problem Relation Age of Onset  . Breast cancer Mother 64  . Diabetes Mother   . Stroke Mother               . Hyperlipidemia Mother   . Heart failure Mother   . Hypertension Mother   . COPD Mother   . Hypertension Father   . Heart disease Father   . Hyperlipidemia Father   . Heart failure Father        dec at age 53  . Breast cancer Sister 24  . Heart disease Sister        Afib  . Psoriasis Son     Social History   Tobacco Use  . Smoking status: Current Every Day Smoker    Packs/day: 0.30    Years: 30.00    Pack years: 9.00    Types: Cigarettes  . Smokeless tobacco: Never Used  . Tobacco comment: pt smokes 5 cigarettes per day. Married, lives with spouse. Cares for elderly parent and in-laws  Vaping Use  . Vaping Use: Never used  Substance Use Topics  . Alcohol use: Yes    Alcohol/week: 2.0 standard drinks    Types: 2 Standard drinks or equivalent per week    Comment: Rare glass of wine every 2-3 weeks  . Drug use: No    Home Medications Prior to Admission medications   Medication Sig Start Date End Date Taking? Authorizing Provider  aspirin EC 81 MG tablet Take 1 tablet (81 mg total) by mouth daily. 04/11/19   Binnie Rail, MD  azelastine (ASTELIN) 0.1 % nasal spray USE IN [facility] NOSTRIL AS DIRECTED 04/25/20   Binnie Rail, MD  cetirizine (ZYRTEC) 10 MG tablet Take 10 mg by mouth daily as needed for allergies.     [provider]  Eluxadoline (VIBERZI) 100 MG TABS Take 1 tablet (100 mg total) by mouth 2 (two) times daily. 09/21/20   Binnie Rail, MD  escitalopram (LEXAPRO) 10 MG tablet Take 1 tablet (10 mg total) by mouth daily. 08/04/19   Nunzio Cobbs, MD  Semaglutide, 1 MG/DOSE, (OZEMPIC, 1 MG/DOSE,) 4 MG/3ML SOPN Inject 0.75 mLs (1 mg total) into the skin once a week. 03/26/20   Binnie Rail, MD  simvastatin (ZOCOR) 20 MG tablet TAKE 1 TABLET (20 MG TOTAL) BY MOUTH DAILY AT 6 PM. 04/25/20   Burns, Claudina Lick, MD  triamcinolone (KENALOG) 0.025 % ointment Apply 1 application topically 2 (two) times daily. Use for one to two weeks at a time. 07/05/19   Nunzio Cobbs, MD  Allergies    Penicillins and Metformin and related  Review of Systems   Review of Systems Ten systems are reviewed and are negative for acute change except as noted in the HPI  Physical Exam Updated Vital Signs BP (!) 141/84 (BP Location: Left Arm)   Pulse 80   Temp 98.8 F (37.1 C) (Oral)   Resp 16   Ht 5\' 3"  (1.6 m)   Wt 65.8 kg   LMP 03/06/2006 (Approximate)   SpO2 97%   BMI 25.69 kg/m   Physical Exam Constitutional:      General: She is not in acute distress.    Appearance: Normal appearance. She is well-developed. She is not ill-appearing or diaphoretic.  HENT:     Head: Normocephalic and atraumatic.     Jaw: There is normal jaw occlusion. No trismus.     Right Ear: External ear normal.     Left Ear: External ear normal.     Nose: Rhinorrhea present. Rhinorrhea is clear.     Right Sinus: No maxillary sinus tenderness or frontal sinus tenderness.     Left Sinus: No maxillary sinus tenderness or frontal sinus tenderness.     Mouth/Throat:     Comments: The patient has normal phonation and is in control of secretions. No stridor.  Midline uvula without edema. Soft palate rises symmetrically. Mild tonsillar erythema without swelling or exudates. Tongue protrusion is normal, floor of mouth is soft. No trismus. No creptius on neck palpation. No gingival erythema or fluctuance noted. Mucus membranes moist. No pallor noted. Eyes:     General: Vision grossly intact. Gaze aligned appropriately.     Pupils: Pupils are equal, round, and  reactive to light.  Neck:     Trachea: Trachea and phonation normal. No tracheal tenderness or tracheal deviation.     Meningeal: Brudzinski's sign absent.  Pulmonary:     Effort: Pulmonary effort is normal. No respiratory distress.  Abdominal:     General: There is no distension.     Palpations: Abdomen is soft.     Tenderness: There is no abdominal tenderness. There is no guarding or rebound.  Musculoskeletal:        General: Normal range of motion.     Cervical back: Normal range of motion and neck supple.  Skin:    General: Skin is warm and dry.  Neurological:     Mental Status: She is alert.     GCS: GCS eye subscore is 4. GCS verbal subscore is 5. GCS motor subscore is 6.     Comments: Speech is clear and goal oriented, follows commands Major Cranial nerves without deficit, no facial droop Moves extremities without ataxia, coordination intact  Psychiatric:        Behavior: Behavior normal.     ED Results / Procedures / Treatments   Labs (all labs ordered are listed, but only abnormal results are displayed) Labs Reviewed  RESP PANEL BY RT-PCR (FLU A&B, COVID) [id number] - Abnormal; Notable for the following components:      Result Value   SARS Coronavirus 2 by RT PCR POSITIVE (*)    All other components within normal limits    EKG None  Radiology DG Chest Portable 1 View  Result Date: 09/21/2020 CLINICAL DATA:  COVID positive, cough, congestion. EXAM: PORTABLE CHEST 1 VIEW COMPARISON:  Chest x-ray 07/27/2006, CT chest 08/11/2006 FINDINGS: The heart size and mediastinal contours are within normal limits. Bibasilar streaky opacities. No pulmonary edema. No pleural effusion. No pneumothorax.  No acute osseous abnormality. IMPRESSION: Bibasilar streaky opacities that could represent atelectasis versus infection/inflammation. Electronically Signed   By: Iven Finn M.D.   On: 09/21/2020 18:48    Procedures Procedures (including critical care time)  Medications Ordered  in ED Medications  casirivimab-imdevimab (REGEN-COV) 1,200 mg in sodium chloride 0.9 % 110 mL IVPB (0 mg Intravenous Stopped 09/21/20 1934)    ED Course  I have reviewed the triage vital signs and the nursing notes.  Pertinent labs & imaging results that were available during my care of the patient were reviewed by me and considered in my medical decision making (see chart for details).  Clinical Course as of 09/22/20 1018  Fri Sep 21, 2020  1826 64 yo female with diabetes presenting with Covid positive diagnosis as outpatient 3 days ago, now on day 4-5 of symptoms.  Fatigue, dyspnea, coughing.  Vitals stable.  Pt requesting MAB infusion and does meet criteria, will check xray, confirm test and give infusion [MT]  2017 Completed infusion, no further issue, okay for discharge [MT]    Clinical Course User Index [MT] Trifan, Carola Rhine, MD   MDM Rules/Calculators/A&P                         Additional history obtained from: 1. Nursing notes from this visit. 2. Review of electronic medical record. ------------------------- 64 year old COVID Positive female arrives on 3-4 day of illness. On exam she is stable appearing and in NAD. VSS on room air. CN intact, airway clear, no menigeal signs, cardiopulmonary exam unremarkable, abdomen soft nontender and patient is NVI to all extremities. Discussed with attending MD Dr. Langston Masker, will give patient MAB infusion and monitor. - Care handoff given to Dr. Langston Masker at shift change final decision for oncoming team  Note: Portions of this report may have been transcribed using voice recognition software. Every effort was made to ensure accuracy; however, inadvertent computerized transcription errors may still be present.  Final Clinical Impression(s) / ED Diagnoses Final diagnoses:  [id number]    Rx / DC Orders ED Discharge Orders    None       Gari Crown 09/22/20 1018    Wyvonnia Dusky, MD 09/22/20 1122

## 2020-09-21 NOTE — ED Notes (Signed)
Critical results received; Covid positive; Dr Langston Masker aware.

## 2020-09-21 NOTE — ED Notes (Signed)
Monoclonal antibody fact sheets given to patient and encouraged her to read prior to transfusion.  Encouraged patient to ask questions.  So far patient seems to understand the info.  She also stated that her husband had an infusion yesterday and she stated that he feels great.

## 2020-09-21 NOTE — ED Notes (Addendum)
Patient stated that she felt that she is getting worst.

## 2020-09-21 NOTE — ED Notes (Signed)
Infusion is completed without any adverse reaction.

## 2020-09-21 NOTE — ED Triage Notes (Addendum)
Pt arrives complaining of sore throat, congestion and cough for a few days. 8 people at work positive for [place]. States she tested positive for COVID last night with home test. NAD noted during triage. Denies SOB/chest pain, N/V/D

## 2020-10-02 ENCOUNTER — Other Ambulatory Visit: Payer: Self-pay

## 2020-10-02 NOTE — Telephone Encounter (Signed)
Medication refill request: Lexapro Last AEX:  07/05/19 Dr. Edward Jolly Next AEX: none Last MMG (if hormonal medication request): n/a Refill authorized: today, please advise  Called patient to schedule AEX. Patient states that she will call back to schedule once she is better from COVID. Per patient, down to only 2 pills of Lexapro

## 2020-10-02 NOTE — Telephone Encounter (Signed)
Patient is checking on lexapro refill request.

## 2020-10-03 MED ORDER — ESCITALOPRAM OXALATE 10 MG PO TABS: 10.0000 mg | ORAL_TABLET | Freq: Every day | ORAL | 0 refills | Status: DC

## 2020-10-16 DIAGNOSIS — M79641 Pain in right hand: Secondary | ICD-10-CM | POA: Diagnosis not present

## 2020-10-16 DIAGNOSIS — G5601 Carpal tunnel syndrome, right upper limb: Secondary | ICD-10-CM | POA: Diagnosis not present

## 2020-10-18 ENCOUNTER — Other Ambulatory Visit: Payer: Self-pay

## 2020-10-18 ENCOUNTER — Encounter: Payer: Self-pay | Admitting: Internal Medicine

## 2020-10-18 MED ORDER — OZEMPIC (1 MG/DOSE) 4 MG/3ML ~~LOC~~ SOPN: 1.0000 mg | PEN_INJECTOR | SUBCUTANEOUS | 1 refills | Status: DC

## 2020-10-23 DIAGNOSIS — G4733 Obstructive sleep apnea (adult) (pediatric): Secondary | ICD-10-CM | POA: Diagnosis not present

## 2020-10-30 ENCOUNTER — Other Ambulatory Visit: Payer: Self-pay | Admitting: Obstetrics and Gynecology

## 2020-11-05 NOTE — Progress Notes (Signed)
Subjective:    Patient ID: Christina Buchanan, female    DOB: August 03, 1956, 65 y.o.   MRN: [id number]  HPI The patient is here for follow up of their chronic medical problems, including DM, IBS-D, OSA, depression, hyperlipidemia, tobacco abuse   Still smoking  She exercises some.    Medications and allergies reviewed with patient and updated if appropriate.  Patient Active Problem List   Diagnosis Date Noted  . Hair changes 10/08/2018  . Genetic testing 06/09/2018  . Family history of breast cancer   . IBS (irritable bowel syndrome) - D 04/05/2018  . OSA (obstructive sleep apnea) 12/15/2017  . Wears hearing aid in both ears 09/08/2017  . Hemorrhoids 09/08/2017  . Status post total abdominal hysterectomy and bilateral salpingo-oophorectomy 10/02/2016  . Diabetes ([place]) 08/12/2016  . Hyperlipidemia 04/29/2016  . Tobacco dependence due to cigarettes 01/21/2010  . Depression 01/21/2010  . ALLERGIC RHINITIS 01/21/2010  . Personal history of colonic polyps 07/30/2006    Current Outpatient Medications on File Prior to Visit  Medication Sig Dispense Refill  . aspirin EC 81 MG tablet Take 1 tablet (81 mg total) by mouth daily.    [name] azelastine (ASTELIN) 0.1 % nasal spray USE IN EACH NOSTRIL AS DIRECTED 30 mL 3  . cetirizine (ZYRTEC) 10 MG tablet Take 10 mg by mouth daily as needed for allergies.     . Eluxadoline (VIBERZI) 100 MG TABS Take 1 tablet (100 mg total) by mouth 2 (two) times daily. 180 tablet 1  . escitalopram (LEXAPRO) 10 MG tablet Take 1 tablet (10 mg total) by mouth daily. Needs appointment. 90 tablet 0  . Semaglutide, 1 MG/DOSE, (OZEMPIC, 1 MG/DOSE,) 4 MG/3ML SOPN Inject 1 mg into the skin once a week. 9 mL 1  . simvastatin (ZOCOR) 20 MG tablet TAKE 1 TABLET (20 MG TOTAL) BY MOUTH DAILY AT 6 PM. 90 tablet 3  . triamcinolone (KENALOG) 0.025 % ointment Apply 1 application topically 2 (two) times daily. Use for one to two weeks at a time. 30 g 1   No current  facility-administered medications on file prior to visit.    Past Medical History:  Diagnosis Date  . ALLERGIC RHINITIS   . Arthritis    hands  . DEPRESSION   . Diabetes mellitus without complication ([place]) 123456   diet controlled  . Family history of breast cancer   . Fibroid, uterine 04/29/2016  . HYPERLIPIDEMIA   . OSA (obstructive sleep apnea) 12/15/2017  . Osteopenia   . Personal history of colonic polyps   . SMOKER   . VITAMIN D DEFICIENCY     Past Surgical History:  Procedure Laterality Date  . ABDOMINAL HYSTERECTOMY N/A 10/02/2016   Procedure: HYSTERECTOMY ABDOMINAL;  Surgeon: Nunzio Cobbs, MD;  Location: [facility];  Service: Gynecology;  Laterality: N/A;  4 hours  . ABDOMINAL SACROCOLPOPEXY N/A 10/02/2016   Procedure: ABDOMINO SACROCOLPOPEXY;  Surgeon: Nunzio Cobbs, MD;  Location: [facility];  Service: Gynecology;  Laterality: N/A;  . ANTERIOR AND POSTERIOR REPAIR N/A 10/02/2016   Procedure: ANTERIOR (CYSTOCELE) AND POSTERIOR REPAIR (RECTOCELE);  Surgeon: Nunzio Cobbs, MD;  Location: [facility];  Service: Gynecology;  Laterality: N/A;  . BLADDER SUSPENSION N/A 10/02/2016   Procedure: TRANSVAGINAL TAPE (TVT) PROCEDURE exact midurethral sling;  Surgeon: Nunzio Cobbs, MD;  Location: [facility];  Service: Gynecology;  Laterality: N/A;  . BREAST LUMPECTOMY WITH RADIOACTIVE SEED LOCALIZATION Left 09/08/2019  Procedure: LEFT BREAST LUMPECTOMY WITH RADIOACTIVE SEED LOCALIZATION;  Surgeon: Coralie Keens, MD;  Location: [facility];  Service: General;  Laterality: Left;  . BREAST SURGERY  1990   breast biopsy  . COLONOSCOPY W/ BIOPSIES  9/07   2 polyps & tubuvillous adenoma  . COLONOSCOPY W/ BIOPSIES  11/13/08   sigmoid ddiverticuli and rechek in 5 years - Dr. Collene Mares  . CYSTOSCOPY N/A 10/02/2016   Procedure: CYSTOSCOPY;  Surgeon: Nunzio Cobbs, MD;  Location: [facility];  Service: Gynecology;  Laterality: N/A;  . ENDOMETRIAL  ABLATION  08/21/03   HTA  . 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 SALPINGOOPHORECTOMY Bilateral 10/02/2016   Procedure: SALPINGO OOPHORECTOMY;  Surgeon: Nunzio Cobbs, MD;  Location: [facility];  Service: Gynecology;  Laterality: Bilateral;  . UPPER GASTROINTESTINAL ENDOSCOPY  06/15/06   2 polyps - H pylori    Social History   Socioeconomic History  . Marital status: Married    Spouse name: Not on file  . Number of children: Not on file  . Years of education: Not on file  . Highest education level: Not on file  Occupational History  . Not on file  Tobacco Use  . Smoking status: Current Every Day Smoker    Packs/day: 0.30    Years: 30.00    Pack years: 9.00    Types: Cigarettes  . Smokeless tobacco: Never Used  . Tobacco comment: pt smokes 5 cigarettes per day. Married, lives with spouse. Cares for elderly parent and in-laws  Vaping Use  . Vaping Use: Never used  Substance and Sexual Activity  . Alcohol use: Yes    Alcohol/week: 2.0 standard drinks    Types: 2 Standard drinks or equivalent per week    Comment: Rare glass of wine every 2-3 weeks  . Drug use: No  . Sexual activity: Yes    Partners: Male    Birth control/protection: Other-see comments, Post-menopausal    Comment: husband had vasectomy  Other Topics Concern  . Not on file  Social History Narrative   Lives with husband in a one story home.  She has 3 grown children.     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     Education: high school.        No regular exercise   Social Determinants of Health   Financial Resource Strain: Not on file  Food Insecurity: Not on file  Transportation Needs: Not on file  Physical Activity: Not on file  Stress: Not on file  Social Connections: Not on file    Family History  Problem Relation Age of Onset  . Breast cancer Mother 7  . Diabetes Mother   . Stroke Mother               . Hyperlipidemia Mother   .  Heart failure Mother   . Hypertension Mother   . COPD Mother   . Hypertension Father   . Heart disease Father   . Hyperlipidemia Father   . Heart failure Father        dec at age 53  . Breast cancer Sister 31  . Heart disease Sister        Afib  . Psoriasis Son     Review of Systems  Constitutional: Negative for chills and fever.  Respiratory: Positive for cough (residual from covid- improving). Negative for shortness of breath and wheezing.  Cardiovascular: Negative for chest pain, palpitations and leg swelling.  Neurological: Negative for light-headedness and headaches.       Objective:   Vitals:   11/06/20 1554  BP: 140/80  Pulse: 65  Temp: 98.6 F (37 C)  SpO2: 97%   BP Readings from Last 3 Encounters:  11/06/20 140/80  09/21/20 (!) 141/84  05/08/20 134/72   Wt Readings from Last 3 Encounters:  11/06/20 148 lb (67.1 kg)  09/21/20 145 lb (65.8 kg)  05/08/20 147 lb (66.7 kg)   Body mass index is 26.22 kg/m.   Physical Exam    Constitutional: Appears well-developed and well-nourished. No distress.  HENT:  Head: Normocephalic and atraumatic.  Neck: Neck supple. No tracheal deviation present. No thyromegaly present.  No cervical lymphadenopathy Cardiovascular: Normal rate, regular rhythm and normal heart sounds.   No murmur heard. No carotid bruit .  No edema Pulmonary/Chest: Effort normal and breath sounds normal. No respiratory distress. No has no wheezes. No rales.  Skin: Skin is warm and dry. Not diaphoretic.  Psychiatric: Normal mood and affect. Behavior is normal.      Assessment & Plan:    See Problem List for Assessment and Plan of chronic medical problems.    This visit occurred during the SARS-CoV-2 public health emergency.  Safety protocols were in place, including screening questions prior to the visit, additional usage of staff PPE, and extensive cleaning of exam room while observing appropriate contact time as indicated for disinfecting  solutions.

## 2020-11-05 NOTE — Patient Instructions (Signed)
    Blood work was ordered.      Medications changes include :   none     Please followup in 6 months  

## 2020-11-06 ENCOUNTER — Other Ambulatory Visit: Payer: Self-pay

## 2020-11-06 ENCOUNTER — Encounter: Payer: Self-pay | Admitting: Internal Medicine

## 2020-11-06 ENCOUNTER — Ambulatory Visit (INDEPENDENT_AMBULATORY_CARE_PROVIDER_SITE_OTHER): Payer: Medicare HMO | Admitting: Internal Medicine

## 2020-11-06 VITALS — BP 140/80 | HR 65 | Temp 98.6°F | Ht 63.0 in | Wt 148.0 lb

## 2020-11-06 DIAGNOSIS — E119 Type 2 diabetes mellitus without complications: Secondary | ICD-10-CM

## 2020-11-06 DIAGNOSIS — E782 Mixed hyperlipidemia: Secondary | ICD-10-CM

## 2020-11-06 DIAGNOSIS — K580 Irritable bowel syndrome with diarrhea: Secondary | ICD-10-CM

## 2020-11-06 DIAGNOSIS — F3289 Other specified depressive episodes: Secondary | ICD-10-CM | POA: Diagnosis not present

## 2020-11-06 DIAGNOSIS — R69 Illness, unspecified: Secondary | ICD-10-CM | POA: Diagnosis not present

## 2020-11-06 DIAGNOSIS — G4733 Obstructive sleep apnea (adult) (pediatric): Secondary | ICD-10-CM

## 2020-11-06 DIAGNOSIS — F17210 Nicotine dependence, cigarettes, uncomplicated: Secondary | ICD-10-CM

## 2020-11-06 MED ORDER — NYSTATIN 100000 UNIT/GM EX CREA
1.0000 | TOPICAL_CREAM | Freq: Two times a day (BID) | CUTANEOUS | 0 refills | Status: DC
Start: 2020-11-06 — End: 2021-05-02

## 2020-11-06 MED ORDER — HYDROCORTISONE 2.5 % EX CREA: TOPICAL_CREAM | Freq: Two times a day (BID) | CUTANEOUS | 0 refills | Status: DC

## 2020-11-06 NOTE — Assessment & Plan Note (Signed)
Chronic Check a1c, cmp Low sugar / carb diet Continue regular exercise Continue ozempic 1 mg weekly

## 2020-11-06 NOTE — Assessment & Plan Note (Signed)
Chronic Controlled, stable Continue lexparo 10 mg daily - I will start prescribing

## 2020-11-06 NOTE — Assessment & Plan Note (Signed)
Chronic lipid panel controlled Continue simvastatin 20 mg daily Regular exercise and healthy diet encouraged

## 2020-11-06 NOTE — Assessment & Plan Note (Signed)
Chronic Controlled, stable Continue viberzi 100 mg BID

## 2020-11-07 LAB — COMPREHENSIVE METABOLIC PANEL
ALT: 17 U/L (ref 0–35)
AST: 18 U/L (ref 0–37)
Albumin: 4.4 g/dL (ref 3.5–5.2)
Alkaline Phosphatase: 48 U/L (ref 39–117)
BUN: 17 mg/dL (ref 6–23)
CO2: 30 mEq/L (ref 19–32)
Calcium: 9.6 mg/dL (ref 8.4–10.5)
Chloride: 106 mEq/L (ref 96–112)
Creatinine, Ser: 0.66 mg/dL (ref 0.40–1.20)
GFR: 91.83 mL/min (ref >60.00)
Glucose, Bld: 83 mg/dL (ref 70–99)
Potassium: 4.3 mEq/L (ref 3.5–5.1)
Sodium: 140 mEq/L (ref 135–145)
Total Bilirubin: 0.4 mg/dL (ref 0.2–1.2)
Total Protein: 7.2 g/dL (ref 6.0–8.3)

## 2020-11-07 LAB — HEMOGLOBIN A1C: Hgb A1c MFr Bld: 5.8 % (ref 4.6–6.5)

## 2020-11-08 ENCOUNTER — Ambulatory Visit: Payer: Medicare HMO | Admitting: Internal Medicine

## 2020-11-15 ENCOUNTER — Encounter: Payer: Self-pay | Admitting: Internal Medicine

## 2020-11-15 NOTE — Progress Notes (Signed)
Outside notes received. Information abstracted. Notes sent to scan.  

## 2020-11-19 ENCOUNTER — Encounter: Payer: Self-pay | Admitting: Internal Medicine

## 2020-11-22 ENCOUNTER — Other Ambulatory Visit: Payer: Self-pay

## 2020-11-22 ENCOUNTER — Ambulatory Visit (INDEPENDENT_AMBULATORY_CARE_PROVIDER_SITE_OTHER): Payer: Medicare HMO | Admitting: Internal Medicine

## 2020-11-22 ENCOUNTER — Encounter: Payer: Self-pay | Admitting: Internal Medicine

## 2020-11-22 VITALS — BP 130/76 | HR 69 | Temp 98.2°F | Ht 63.0 in | Wt 148.0 lb

## 2020-11-22 DIAGNOSIS — G4733 Obstructive sleep apnea (adult) (pediatric): Secondary | ICD-10-CM

## 2020-11-22 DIAGNOSIS — F17210 Nicotine dependence, cigarettes, uncomplicated: Secondary | ICD-10-CM

## 2020-11-22 DIAGNOSIS — H900 Conductive hearing loss, bilateral: Secondary | ICD-10-CM

## 2020-11-22 DIAGNOSIS — E119 Type 2 diabetes mellitus without complications: Secondary | ICD-10-CM | POA: Diagnosis not present

## 2020-11-22 DIAGNOSIS — R69 Illness, unspecified: Secondary | ICD-10-CM | POA: Diagnosis not present

## 2020-11-22 DIAGNOSIS — E782 Mixed hyperlipidemia: Secondary | ICD-10-CM | POA: Diagnosis not present

## 2020-11-22 DIAGNOSIS — H6123 Impacted cerumen, bilateral: Secondary | ICD-10-CM | POA: Diagnosis not present

## 2020-11-22 DIAGNOSIS — H9193 Unspecified hearing loss, bilateral: Secondary | ICD-10-CM | POA: Insufficient documentation

## 2020-11-22 NOTE — Assessment & Plan Note (Signed)
Chronic, last f/u at 3 yrs, stable but due for f/u with Dr Erlinda Hong - pulm,

## 2020-11-22 NOTE — Patient Instructions (Signed)
Your ears were irrigated of wax impactions today  Please continue all other medications as before, and refills have been done if requested.  Please have the pharmacy call with any other refills you may need.  Please continue your efforts at being more active, low cholesterol diet, and weight control.  Please keep your appointments with your specialists as you may have planned  You will be contacted regarding the referral for: pulmonary for sleep apnea f/u and possible Low Dose CT Scan start (the "LDCT")  You will be contacted regarding the referral for: Cardiac CT scoring (of which the CT scan actually dose view the lungs to some extent as well)

## 2020-11-22 NOTE — Progress Notes (Signed)
Patient ID: Christina Buchanan, female   DOB: [birth year], 65 y.o.   MRN: [id number]        Chief Complaint: bilateral hearing loss, and due for f/u osa and smoking cessation       HPI:  Christina Buchanan is a 65 y.o. female here with c/o 1 wk or more muffled hearing loss, has to turn the RV up loud, cant hear those speech well around her, without HA, ST, cough, fever, chills, dizziness, CP or sob.  Nothing else makes better or worse.  Still smoking and not ready to quit, but is interested in Cardiac CT score as there is FH of CV dz as well.  Also due for f/u OSA as has not been seen for about 4 yrs.  Pt denies new neurological symptoms such as new headache, or facial or extremity weakness or numbness   Pt denies polydipsia, polyuria,   Wt Readings from Last 3 Encounters:  11/22/20 148 lb (67.1 kg)  11/06/20 148 lb (67.1 kg)  09/21/20 145 lb (65.8 kg)   BP Readings from Last 3 Encounters:  11/22/20 130/76  11/06/20 140/80  09/21/20 (!) 141/84         Past Medical History:  Diagnosis Date  . ALLERGIC RHINITIS   . Arthritis    hands  . DEPRESSION   . Diabetes mellitus without complication ([place]) 9833   diet controlled  . Family history of breast cancer   . Fibroid, uterine 04/29/2016  . HYPERLIPIDEMIA   . OSA (obstructive sleep apnea) 12/15/2017  . Osteopenia   . Personal history of colonic polyps   . SMOKER   . VITAMIN D DEFICIENCY    Past Surgical History:  Procedure Laterality Date  . ABDOMINAL HYSTERECTOMY N/A 10/02/2016   Procedure: HYSTERECTOMY ABDOMINAL;  Surgeon: Nunzio Cobbs, MD;  Location: [facility];  Service: Gynecology;  Laterality: N/A;  4 hours  . ABDOMINAL SACROCOLPOPEXY N/A 10/02/2016   Procedure: ABDOMINO SACROCOLPOPEXY;  Surgeon: Nunzio Cobbs, MD;  Location: [facility];  Service: Gynecology;  Laterality: N/A;  . ANTERIOR AND POSTERIOR REPAIR N/A 10/02/2016   Procedure: ANTERIOR (CYSTOCELE) AND POSTERIOR REPAIR (RECTOCELE);  Surgeon: Nunzio Cobbs, MD;  Location: [facility];  Service: Gynecology;  Laterality: N/A;  . BLADDER SUSPENSION N/A 10/02/2016   Procedure: TRANSVAGINAL TAPE (TVT) PROCEDURE exact midurethral sling;  Surgeon: Nunzio Cobbs, MD;  Location: [facility];  Service: Gynecology;  Laterality: N/A;  . BREAST LUMPECTOMY WITH RADIOACTIVE SEED LOCALIZATION Left 09/08/2019   Procedure: LEFT BREAST LUMPECTOMY WITH RADIOACTIVE SEED LOCALIZATION;  Surgeon: Coralie Keens, MD;  Location: [facility];  Service: General;  Laterality: Left;  . BREAST SURGERY  1990   breast biopsy  . COLONOSCOPY W/ BIOPSIES  9/07   2 polyps & tubuvillous adenoma  . COLONOSCOPY W/ BIOPSIES  11/13/08   sigmoid ddiverticuli and rechek in 5 years - Dr. Collene Mares  . CYSTOSCOPY N/A 10/02/2016   Procedure: CYSTOSCOPY;  Surgeon: Nunzio Cobbs, MD;  Location: [facility];  Service: Gynecology;  Laterality: N/A;  . ENDOMETRIAL ABLATION  08/21/03   HTA  . 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 SALPINGOOPHORECTOMY Bilateral 10/02/2016   Procedure: SALPINGO OOPHORECTOMY;  Surgeon: Nunzio Cobbs, MD;  Location: [facility];  Service: Gynecology;  Laterality: Bilateral;  . UPPER GASTROINTESTINAL ENDOSCOPY  06/15/06   2 polyps -  H pylori    reports that she has been smoking cigarettes. She has a 9.00 pack-year smoking history. She has never used smokeless tobacco. She reports current alcohol use of about 2.0 standard drinks of alcohol per week. She reports that she does not use drugs. family history includes Breast cancer (age of onset: 48) in her mother; Breast cancer (age of onset: 70) in her sister; COPD in her mother; Diabetes in her mother; Heart disease in her father and sister; Heart failure in her father and mother; Hyperlipidemia in her father and mother; Hypertension in her father and mother; Psoriasis in her son; Stroke in her mother. Allergies  Allergen  Reactions  . Penicillins Nausea And Vomiting  . Metformin And Related     Loose stools   Current Outpatient Medications on File Prior to Visit  Medication Sig Dispense Refill  . aspirin EC 81 MG tablet Take 1 tablet (81 mg total) by mouth daily.    [name] azelastine (ASTELIN) 0.1 % nasal spray USE IN EACH NOSTRIL AS DIRECTED 30 mL 3  . cetirizine (ZYRTEC) 10 MG tablet Take 10 mg by mouth daily as needed for allergies.     . Eluxadoline (VIBERZI) 100 MG TABS Take 1 tablet (100 mg total) by mouth 2 (two) times daily. 180 tablet 1  . escitalopram (LEXAPRO) 10 MG tablet Take 1 tablet (10 mg total) by mouth daily. Needs appointment. 90 tablet 0  . hydrocortisone 2.5 % cream Apply topically 2 (two) times daily. 30 g 0  . nystatin cream (MYCOSTATIN) Apply 1 application topically 2 (two) times daily. 30 g 0  . Semaglutide, 1 MG/DOSE, (OZEMPIC, 1 MG/DOSE,) 4 MG/3ML SOPN Inject 1 mg into the skin once a week. 9 mL 1  . simvastatin (ZOCOR) 20 MG tablet TAKE 1 TABLET (20 MG TOTAL) BY MOUTH DAILY AT 6 PM. 90 tablet 3  . triamcinolone (KENALOG) 0.025 % ointment Apply 1 application topically 2 (two) times daily. Use for one to two weeks at a time. 30 g 1   No current facility-administered medications on file prior to visit.        ROS:  All others reviewed and negative.  Objective        PE:  BP 130/76   Pulse 69   Temp 98.2 F (36.8 C) (Oral)   Ht 5\' 3"  (1.6 m)   Wt 148 lb (67.1 kg)   LMP 03/06/2006 (Approximate)   SpO2 95%   BMI 26.22 kg/m                 Constitutional: Pt appears in NAD               HENT: Head: NCAT.                Right Ear: External ear normal.                 Left Ear: External ear normal.  bilat wax impactions irrigated and hearing improved               Eyes: . Pupils are equal, round, and reactive to light. Conjunctivae and EOM are normal               Nose: without d/c or deformity               Neck: Neck supple. Gross normal ROM               Cardiovascular:  Normal rate and  regular rhythm.                 Pulmonary/Chest: Effort normal and breath sounds without rales or wheezing.                Abd:  Soft, NT, ND, + BS, no organomegaly               Neurological: Pt is alert. At baseline orientation, motor grossly intact               Skin: Skin is warm. No rashes, no other new lesions, LE edema - none               Psychiatric: Pt behavior is normal without agitation   Micro: none  Cardiac tracings I have personally interpreted today:  none  Pertinent Radiological findings (summarize): none   Lab Results  Component Value Date   WBC 6.3 05/10/2020   HGB 13.6 05/10/2020   HCT 39.7 05/10/2020   PLT 239.0 05/10/2020   GLUCOSE 83 11/06/2020   CHOL 125 05/10/2020   TRIG 61.0 05/10/2020   HDL 44.50 05/10/2020   LDLDIRECT 153.6 12/12/2011   LDLCALC 69 05/10/2020   ALT 17 11/06/2020   AST 18 11/06/2020   NA 140 11/06/2020   K 4.3 11/06/2020   CL 106 11/06/2020   CREATININE 0.66 11/06/2020   BUN 17 11/06/2020   CO2 30 11/06/2020   TSH 1.43 05/10/2020   HGBA1C 5.8 11/06/2020   MICROALBUR 1.0 05/10/2020   Assessment/Plan:  Shequita Peplinski is a 65 y.o. White or Caucasian [1] female with  has a past medical history of ALLERGIC RHINITIS, Arthritis, DEPRESSION, Diabetes mellitus without complication ([place]) (1610), Family history of breast cancer, Fibroid, uterine (04/29/2016), HYPERLIPIDEMIA, OSA (obstructive sleep apnea) (12/15/2017), Osteopenia, Personal history of colonic polyps, SMOKER, and VITAMIN D DEFICIENCY.  Bilateral hearing loss Acute conductive on chronic sensorineural, improved with bilateral irrigation of wax impactions  OSA (obstructive sleep apnea) Chronic, last f/u at 3 yrs, stable but due for f/u with Dr Erlinda Hong - pulm,  Tobacco dependence due to cigarettes Counseled to quit, and to f/u with pulm regarding LDCT scanning, and also for Cardiac CT scoring  Diabetes [facility]) Lab Results  Component Value Date   HGBA1C 5.8  11/06/2020   Stable, pt to continue current medical treatment - ozempic  Current Outpatient Medications (Endocrine & Metabolic):  .  Semaglutide, 1 MG/DOSE, (OZEMPIC, 1 MG/DOSE,) 4 MG/3ML SOPN, Inject 1 mg into the skin once a week.  Current Outpatient Medications (Cardiovascular):  .  simvastatin (ZOCOR) 20 MG tablet, TAKE 1 TABLET (20 MG TOTAL) BY MOUTH DAILY AT 6 PM.  Current Outpatient Medications (Respiratory):  .  azelastine (ASTELIN) 0.1 % nasal spray, USE IN EACH NOSTRIL AS DIRECTED .  cetirizine (ZYRTEC) 10 MG tablet, Take 10 mg by mouth daily as needed for allergies.   Current Outpatient Medications (Analgesics):  .  aspirin EC 81 MG tablet, Take 1 tablet (81 mg total) by mouth daily.   Current Outpatient Medications (Other):  .  Eluxadoline (VIBERZI) 100 MG TABS, Take 1 tablet (100 mg total) by mouth 2 (two) times daily. [name]  escitalopram (LEXAPRO) 10 MG tablet, Take 1 tablet (10 mg total) by mouth daily. Needs appointment. .  hydrocortisone 2.5 % cream, Apply topically 2 (two) times daily. [name]  nystatin cream (MYCOSTATIN), Apply 1 application topically 2 (two) times daily. [name]  triamcinolone (KENALOG) 0.025 % ointment, Apply 1 application topically 2 (two) times daily.  Use for one to two weeks at a time.   Hyperlipidemia Lab Results  Component Value Date   LDLCALC 69 05/10/2020   Stable, pt to continue current statin zocor 20   Followup: Return if symptoms worsen or fail to improve.  Cathlean Cower, MD 11/24/2020 4:57 PM [facility]

## 2020-11-22 NOTE — Assessment & Plan Note (Signed)
Acute conductive on chronic sensorineural, improved with bilateral irrigation of wax impactions

## 2020-11-22 NOTE — Assessment & Plan Note (Signed)
Counseled to quit, and to f/u with pulm regarding LDCT scanning, and also for Cardiac CT scoring

## 2020-11-24 ENCOUNTER — Encounter: Payer: Self-pay | Admitting: Internal Medicine

## 2020-11-24 NOTE — Assessment & Plan Note (Signed)
Lab Results  Component Value Date   HGBA1C 5.8 11/06/2020   Stable, pt to continue current medical treatment - ozempic  Current Outpatient Medications (Endocrine & Metabolic):  .  Semaglutide, 1 MG/DOSE, (OZEMPIC, 1 MG/DOSE,) 4 MG/3ML SOPN, Inject 1 mg into the skin once a week.  Current Outpatient Medications (Cardiovascular):  .  simvastatin (ZOCOR) 20 MG tablet, TAKE 1 TABLET (20 MG TOTAL) BY MOUTH DAILY AT 6 PM.  Current Outpatient Medications (Respiratory):  .  azelastine (ASTELIN) 0.1 % nasal spray, USE IN EACH NOSTRIL AS DIRECTED .  cetirizine (ZYRTEC) 10 MG tablet, Take 10 mg by mouth daily as needed for allergies.   Current Outpatient Medications (Analgesics):  .  aspirin EC 81 MG tablet, Take 1 tablet (81 mg total) by mouth daily.   Current Outpatient Medications (Other):  .  Eluxadoline (VIBERZI) 100 MG TABS, Take 1 tablet (100 mg total) by mouth 2 (two) times daily. [name]  escitalopram (LEXAPRO) 10 MG tablet, Take 1 tablet (10 mg total) by mouth daily. Needs appointment. .  hydrocortisone 2.5 % cream, Apply topically 2 (two) times daily. [name]  nystatin cream (MYCOSTATIN), Apply 1 application topically 2 (two) times daily. [name]  triamcinolone (KENALOG) 0.025 % ointment, Apply 1 application topically 2 (two) times daily. Use for one to two weeks at a time.

## 2020-11-24 NOTE — Assessment & Plan Note (Signed)
Lab Results  Component Value Date   LDLCALC 69 05/10/2020   Stable, pt to continue current statin zocor 20

## 2020-12-12 ENCOUNTER — Telehealth (INDEPENDENT_AMBULATORY_CARE_PROVIDER_SITE_OTHER): Payer: Medicare HMO | Admitting: Internal Medicine

## 2020-12-12 ENCOUNTER — Encounter: Payer: Self-pay | Admitting: Internal Medicine

## 2020-12-12 DIAGNOSIS — J0100 Acute maxillary sinusitis, unspecified: Secondary | ICD-10-CM | POA: Diagnosis not present

## 2020-12-12 MED ORDER — AZITHROMYCIN 250 MG PO TABS: ORAL_TABLET | ORAL | 0 refills | Status: DC

## 2020-12-12 NOTE — Assessment & Plan Note (Signed)
Acute Strong history of sinus infections Likely bacterial Start Z-Pak-this is always worked well for her Continue Advil sinus and cold and Tylenol Increase fluids Call if no improvement

## 2020-12-12 NOTE — Progress Notes (Signed)
Virtual Visit via Video Note  I connected with Christina Buchanan on 12/12/20 at 11:15 AM EST by a video enabled telemedicine application and verified that I am speaking with the correct person using two identifiers.   I discussed the limitations of evaluation and management by telemedicine and the availability of in person appointments. The patient expressed understanding and agreed to proceed.  Present for the visit:  Myself, Dr Billey Gosling, Rhae Lerner.  The patient is currently at home and I am in the office.    No referring provider.    History of Present Illness: She is here for an acute visit for cold symptoms.   Her symptoms started 3 days ago   She is experiencing left ear pain, sinus pain, teeth pain - they are very painful.  She has nasal congestion and her left side of her tongue hurts.  She has significant congestion and drainage.  She has had some headaches.  She denies any pain to the chest.  She typically does get sinus infections-this is more intense than usual.  She has not had the degree of tooth pain or tongue pain like this in the past.  She has tried taking advil cold and sinus in addition to Tylenol.  These medications do temporary relief the pain.   Review of Systems  Constitutional: Negative for fever.  HENT: Positive for congestion, ear pain and sinus pain. Negative for sore throat.        Teeth pain, drainage  Respiratory: Negative for cough, shortness of breath and wheezing.   Neurological: Positive for headaches. Negative for dizziness.      Social History   Socioeconomic History  . Marital status: Married    Spouse name: Not on file  . Number of children: Not on file  . Years of education: Not on file  . Highest education level: Not on file  Occupational History  . Not on file  Tobacco Use  . Smoking status: Current Every Day Smoker    Packs/day: 0.30    Years: 30.00    Pack years: 9.00    Types: Cigarettes  . Smokeless tobacco: Never Used   . Tobacco comment: pt smokes 5 cigarettes per day. Married, lives with spouse. Cares for elderly parent and in-laws  Vaping Use  . Vaping Use: Never used  Substance and Sexual Activity  . Alcohol use: Yes    Alcohol/week: 2.0 standard drinks    Types: 2 Standard drinks or equivalent per week    Comment: Rare glass of wine every 2-3 weeks  . Drug use: No  . Sexual activity: Yes    Partners: Male    Birth control/protection: Other-see comments, Post-menopausal    Comment: husband had vasectomy  Other Topics Concern  . Not on file  Social History Narrative   Lives with husband in a one story home.  She has 3 grown children.     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     Education: high school.        No regular exercise   Social Determinants of Health   Financial Resource Strain: Not on file  Food Insecurity: Not on file  Transportation Needs: Not on file  Physical Activity: Not on file  Stress: Not on file  Social Connections: Not on file     Observations/Objective: Appears well in NAD   Assessment and Plan:  See Problem List for Assessment and Plan of chronic medical problems.   Follow Up Instructions:  I discussed the assessment and treatment plan with the patient. The patient was provided an opportunity to ask questions and all were answered. The patient agreed with the plan and demonstrated an understanding of the instructions.   The patient was advised to call back or seek an in-person evaluation if the symptoms worsen or if the condition fails to improve as anticipated.    Binnie Rail, MD

## 2020-12-14 ENCOUNTER — Ambulatory Visit (INDEPENDENT_AMBULATORY_CARE_PROVIDER_SITE_OTHER)
Admission: RE | Admit: 2020-12-14 | Discharge: 2020-12-14 | Disposition: A | Discharge status: Home / Self Care | Payer: Self-pay | Source: Ambulatory Visit | Attending: Internal Medicine | Admitting: Internal Medicine

## 2020-12-14 ENCOUNTER — Other Ambulatory Visit: Payer: Self-pay

## 2020-12-14 DIAGNOSIS — E119 Type 2 diabetes mellitus without complications: Secondary | ICD-10-CM

## 2020-12-14 DIAGNOSIS — F17210 Nicotine dependence, cigarettes, uncomplicated: Secondary | ICD-10-CM

## 2020-12-14 DIAGNOSIS — E782 Mixed hyperlipidemia: Secondary | ICD-10-CM

## 2020-12-16 ENCOUNTER — Encounter: Payer: Self-pay | Admitting: Internal Medicine

## 2020-12-21 ENCOUNTER — Other Ambulatory Visit: Payer: Self-pay

## 2020-12-21 NOTE — Telephone Encounter (Signed)
Medication refill request: escitalopram 10mg  Last AEX:  07-05-2019 Next AEX: not scheduled Last MMG (if hormonal medication request): n/a Refill authorized: please approve 30 pills if appropriate. Pharmacy note placed letting patient know that no more refills will be allowed until she has aex.

## 2020-12-23 ENCOUNTER — Other Ambulatory Visit: Payer: Self-pay | Admitting: Internal Medicine

## 2020-12-25 ENCOUNTER — Encounter: Payer: Self-pay | Admitting: Internal Medicine

## 2020-12-27 MED ORDER — ESCITALOPRAM OXALATE 10 MG PO TABS: 10.0000 mg | ORAL_TABLET | Freq: Every day | ORAL | 1 refills | Status: DC

## 2021-01-01 ENCOUNTER — Telehealth: Payer: Self-pay

## 2021-01-01 NOTE — Telephone Encounter (Signed)
Christina Buchanan (Key: [id number])  Your information has been submitted to [place] [employer] Part D. [employer] will review the request and will issue a decision, typically within 1-3 days from your submission. You can check the updated outcome later by reopening this request.  If [employer] has not responded in 1-3 days or if you have any questions about your ePA request, please contact [place] [employer] Part [employer] at [phone]. If you think there may be a problem with your PA request, use our live chat feature at the bottom right.

## 2021-01-02 NOTE — Telephone Encounter (Signed)
Received approval today.  Covered from 10/06/2020 -10/05/2021.

## 2021-01-22 DIAGNOSIS — G4733 Obstructive sleep apnea (adult) (pediatric): Secondary | ICD-10-CM | POA: Diagnosis not present

## 2021-01-31 NOTE — Progress Notes (Signed)
Subjective:    Patient ID: Christina Buchanan, female    DOB: 1956/06/23, 65 y.o.   MRN: [id number]  HPI The patient is here for an acute visit.   Abdominal pain -  It started 4 days ago. It is more prevalent when she walks.  She took ibuprofen and it helped.   This morning she thought it may be a uti.  Pain is located in the LLQ and she has had a little bloating.  Last Friday she did have some diarrhea after eating out - it lasted 4 hours.  Her Bm's are now normal.    She denies fever.  She has urinary frequency.  She denies dysuria.    Last colonoscopy with diverticulosis - no episodes of diverticulitis in the past.    Medications and allergies reviewed with patient and updated if appropriate.  Patient Active Problem List   Diagnosis Date Noted  . Bilateral hearing loss 11/22/2020  . Deviated septum 09/11/2020  . Acute sinus infection 01/17/2019  . Hair changes 10/08/2018  . Genetic testing 06/09/2018  . Family history of breast cancer   . IBS (irritable bowel syndrome) - D 04/05/2018  . OSA (obstructive sleep apnea) 12/15/2017  . Wears hearing aid in both ears 09/08/2017  . Hemorrhoids 09/08/2017  . Status post total abdominal hysterectomy and bilateral salpingo-oophorectomy 10/02/2016  . Diabetes ([place]) 08/12/2016  . Hyperlipidemia 04/29/2016  . Tobacco dependence due to cigarettes 01/21/2010  . Depression 01/21/2010  . ALLERGIC RHINITIS 01/21/2010  . Personal history of colonic polyps 07/30/2006    Current Outpatient Medications on File Prior to Visit  Medication Sig Dispense Refill  . aspirin EC 81 MG tablet Take 1 tablet (81 mg total) by mouth daily.    [name] azelastine (ASTELIN) 0.1 % nasal spray USE IN EACH NOSTRIL AS DIRECTED 30 mL 3  . cetirizine (ZYRTEC) 10 MG tablet Take 10 mg by mouth daily as needed for allergies.     . Eluxadoline (VIBERZI) 100 MG TABS Take 1 tablet (100 mg total) by mouth 2 (two) times daily. 180 tablet 1  . escitalopram (LEXAPRO) 10 MG  tablet Take 1 tablet (10 mg total) by mouth daily. Needs appointment. 90 tablet 1  . hydrocortisone 2.5 % cream Apply topically 2 (two) times daily. 30 g 0  . nystatin cream (MYCOSTATIN) Apply 1 application topically 2 (two) times daily. 30 g 0  . Semaglutide, 1 MG/DOSE, (OZEMPIC, 1 MG/DOSE,) 4 MG/3ML SOPN Inject 1 mg into the skin once a week. 9 mL 1  . simvastatin (ZOCOR) 20 MG tablet TAKE 1 TABLET BY MOUTH DAILY AT 6PM 90 tablet 3  . triamcinolone (KENALOG) 0.025 % ointment Apply 1 application topically 2 (two) times daily. Use for one to two weeks at a time. 30 g 1   No current facility-administered medications on file prior to visit.    Past Medical History:  Diagnosis Date  . ALLERGIC RHINITIS   . Arthritis    hands  . DEPRESSION   . Diabetes mellitus without complication ([place]) 6195   diet controlled  . Family history of breast cancer   . Fibroid, uterine 04/29/2016  . HYPERLIPIDEMIA   . OSA (obstructive sleep apnea) 12/15/2017  . Osteopenia   . Personal history of colonic polyps   . SMOKER   . VITAMIN D DEFICIENCY     Past Surgical History:  Procedure Laterality Date  . ABDOMINAL HYSTERECTOMY N/A 10/02/2016   Procedure: HYSTERECTOMY ABDOMINAL;  Surgeon: Everardo All  Yisroel Ramming, MD;  Location: [facility];  Service: Gynecology;  Laterality: N/A;  4 hours  . ABDOMINAL SACROCOLPOPEXY N/A 10/02/2016   Procedure: ABDOMINO SACROCOLPOPEXY;  Surgeon: Nunzio Cobbs, MD;  Location: [facility];  Service: Gynecology;  Laterality: N/A;  . ANTERIOR AND POSTERIOR REPAIR N/A 10/02/2016   Procedure: ANTERIOR (CYSTOCELE) AND POSTERIOR REPAIR (RECTOCELE);  Surgeon: Nunzio Cobbs, MD;  Location: [facility];  Service: Gynecology;  Laterality: N/A;  . BLADDER SUSPENSION N/A 10/02/2016   Procedure: TRANSVAGINAL TAPE (TVT) PROCEDURE exact midurethral sling;  Surgeon: Nunzio Cobbs, MD;  Location: [facility];  Service: Gynecology;  Laterality: N/A;  . BREAST LUMPECTOMY WITH  RADIOACTIVE SEED LOCALIZATION Left 09/08/2019   Procedure: LEFT BREAST LUMPECTOMY WITH RADIOACTIVE SEED LOCALIZATION;  Surgeon: Coralie Keens, MD;  Location: [facility];  Service: General;  Laterality: Left;  . BREAST SURGERY  1990   breast biopsy  . COLONOSCOPY W/ BIOPSIES  9/07   2 polyps & tubuvillous adenoma  . COLONOSCOPY W/ BIOPSIES  11/13/08   sigmoid ddiverticuli and rechek in 5 years - Dr. Collene Mares  . CYSTOSCOPY N/A 10/02/2016   Procedure: CYSTOSCOPY;  Surgeon: Nunzio Cobbs, MD;  Location: [facility];  Service: Gynecology;  Laterality: N/A;  . ENDOMETRIAL ABLATION  08/21/03   HTA  . 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 SALPINGOOPHORECTOMY Bilateral 10/02/2016   Procedure: SALPINGO OOPHORECTOMY;  Surgeon: Nunzio Cobbs, MD;  Location: [facility];  Service: Gynecology;  Laterality: Bilateral;  . UPPER GASTROINTESTINAL ENDOSCOPY  06/15/06   2 polyps - H pylori    Social History   Socioeconomic History  . Marital status: Married    Spouse name: Not on file  . Number of children: Not on file  . Years of education: Not on file  . Highest education level: Not on file  Occupational History  . Not on file  Tobacco Use  . Smoking status: Current Every Day Smoker    Packs/day: 0.30    Years: 30.00    Pack years: 9.00    Types: Cigarettes  . Smokeless tobacco: Never Used  . Tobacco comment: pt smokes 5 cigarettes per day. Married, lives with spouse. Cares for elderly parent and in-laws  Vaping Use  . Vaping Use: Never used  Substance and Sexual Activity  . Alcohol use: Yes    Alcohol/week: 2.0 standard drinks    Types: 2 Standard drinks or equivalent per week    Comment: Rare glass of wine every 2-3 weeks  . Drug use: No  . Sexual activity: Yes    Partners: Male    Birth control/protection: Other-see comments, Post-menopausal    Comment: husband had vasectomy  Other Topics Concern   . Not on file  Social History Narrative   Lives with husband in a one story home.  She has 3 grown children.     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     Education: high school.        No regular exercise   Social Determinants of Health   Financial Resource Strain: Not on file  Food Insecurity: Not on file  Transportation Needs: Not on file  Physical Activity: Not on file  Stress: Not on file  Social Connections: Not on file    Family History  Problem Relation Age of Onset  . Breast cancer Mother 18  . Diabetes  Mother   . Stroke Mother               . Hyperlipidemia Mother   . Heart failure Mother   . Hypertension Mother   . COPD Mother   . Hypertension Father   . Heart disease Father   . Hyperlipidemia Father   . Heart failure Father        dec at age 76  . Breast cancer Sister 65  . Heart disease Sister        Afib  . Psoriasis Son     Review of Systems  Constitutional: Negative for chills and fever.  HENT: Positive for dental problem.   Gastrointestinal: Positive for abdominal pain (LLQ worse with walking). Negative for blood in stool and nausea.       No gerd  Genitourinary: Positive for frequency. Negative for dysuria and hematuria (no cloudy urine).       Objective:   Vitals:   02/01/21 1003  BP: 124/72  Pulse: 65  Temp: 98.4 F (36.9 C)  SpO2: 95%   BP Readings from Last 3 Encounters:  02/01/21 124/72  11/22/20 130/76  11/06/20 140/80   Wt Readings from Last 3 Encounters:  02/01/21 147 lb 12.8 oz (67 kg)  11/22/20 148 lb (67.1 kg)  11/06/20 148 lb (67.1 kg)   Body mass index is 26.18 kg/m.   Physical Exam Constitutional:      General: She is not in acute distress.    Appearance: She is well-developed. She is not ill-appearing.  HENT:     Head: Normocephalic and atraumatic.  Abdominal:     General: Abdomen is flat.     Palpations: Abdomen is soft.     Tenderness: There is abdominal tenderness in the left lower quadrant. There is no right  CVA tenderness, left CVA tenderness, guarding or rebound. Negative signs include Murphy's sign and McBurney's sign.     Hernia: No hernia is present.  Skin:    General: Skin is warm and dry.  Neurological:     Mental Status: She is alert.        CT Abdomen Pelvis W Contrast CLINICAL DATA:  Left lower quadrant pain for 4 days, history of prior colonic polyp resection, tobacco abuse  EXAM: CT ABDOMEN AND PELVIS WITH CONTRAST  TECHNIQUE: Multidetector CT imaging of the abdomen and pelvis was performed using the standard protocol following bolus administration of intravenous contrast.  CONTRAST:  33mL ISOVUE-370 IOPAMIDOL (ISOVUE-370) INJECTION 76%  Creatinine was obtained on site at [facility] at [address]  Results: Creatinine 0.6 mg/dL.  COMPARISON:  None.  FINDINGS: Lower chest: No acute pleural or parenchymal lung disease.  Hepatobiliary: Mildly heterogeneous decreased liver echotexture most consistent with hepatic steatosis. No focal liver abnormality. No biliary dilation. The gallbladder is unremarkable.  Pancreas: Unremarkable. No pancreatic ductal dilatation or surrounding inflammatory changes.  Spleen: Normal in size without focal abnormality.  Adrenals/Urinary Tract: Adrenal glands are unremarkable. Kidneys are normal, without renal calculi, focal lesion, or hydronephrosis. Bladder is unremarkable.  Stomach/Bowel: There is diffuse diverticulosis of the distal colon, with segmental wall thickening and pericolonic fat stranding involving the sigmoid colon. Findings are consistent with acute uncomplicated diverticulitis. No perforation, fluid collection, or abscess.  No bowel obstruction or ileus. Postsurgical changes are seen from partial right hemicolectomy and reanastomosis.  Vascular/Lymphatic: Minimal atherosclerosis of the distal aorta and bilateral iliac vessels. No pathologic adenopathy.  Reproductive: Status post hysterectomy.  No adnexal masses.  Other:  No free fluid or free intraperitoneal gas. No evidence of abdominal wall hernia.  Musculoskeletal: No acute or destructive bony lesions. Reconstructed images demonstrate no additional findings.  IMPRESSION: 1. Acute uncomplicated sigmoid diverticulitis. No perforation, fluid collection, or abscess. 2.  Aortic Atherosclerosis (ICD10-I70.0).  These results will be called to the ordering clinician or representative by the Radiologist Assistant, and communication documented in the PACS or [employer].  Electronically Signed   By: Randa Ngo M.D.   On: 02/01/2021 15:11     Urine dip here with protein.  No leuks or nitrates.   Assessment & Plan:    See Problem List for Assessment and Plan of chronic medical problems.    This visit occurred during the SARS-CoV-2 public health emergency.  Safety protocols were in place, including screening questions prior to the visit, additional usage of staff PPE, and extensive cleaning of exam room while observing appropriate contact time as indicated for disinfecting solutions.

## 2021-02-01 ENCOUNTER — Encounter: Payer: Self-pay | Admitting: Internal Medicine

## 2021-02-01 ENCOUNTER — Other Ambulatory Visit: Payer: Self-pay | Admitting: Internal Medicine

## 2021-02-01 ENCOUNTER — Ambulatory Visit (INDEPENDENT_AMBULATORY_CARE_PROVIDER_SITE_OTHER): Payer: Medicare HMO | Admitting: Internal Medicine

## 2021-02-01 ENCOUNTER — Other Ambulatory Visit: Payer: Self-pay

## 2021-02-01 ENCOUNTER — Ambulatory Visit
Admission: RE | Admit: 2021-02-01 | Discharge: 2021-02-01 | Disposition: A | Discharge status: Home / Self Care | Payer: Medicare HMO | Source: Ambulatory Visit | Attending: Internal Medicine | Admitting: Internal Medicine

## 2021-02-01 VITALS — BP 124/72 | HR 65 | Temp 98.4°F | Ht 63.0 in | Wt 147.8 lb

## 2021-02-01 DIAGNOSIS — R1032 Left lower quadrant pain: Secondary | ICD-10-CM | POA: Diagnosis not present

## 2021-02-01 DIAGNOSIS — K5732 Diverticulitis of large intestine without perforation or abscess without bleeding: Secondary | ICD-10-CM | POA: Diagnosis not present

## 2021-02-01 DIAGNOSIS — R300 Dysuria: Secondary | ICD-10-CM

## 2021-02-01 DIAGNOSIS — K5792 Diverticulitis of intestine, part unspecified, without perforation or abscess without bleeding: Secondary | ICD-10-CM

## 2021-02-01 DIAGNOSIS — K5730 Diverticulosis of large intestine without perforation or abscess without bleeding: Secondary | ICD-10-CM | POA: Diagnosis not present

## 2021-02-01 DIAGNOSIS — I700 Atherosclerosis of aorta: Secondary | ICD-10-CM

## 2021-02-01 LAB — URINALYSIS, ROUTINE W REFLEX MICROSCOPIC
Bilirubin Urine: NEGATIVE
Hgb urine dipstick: NEGATIVE
Ketones, ur: NEGATIVE
Leukocytes,Ua: NEGATIVE
Nitrite: NEGATIVE
RBC / HPF: NONE SEEN (ref 0–?)
Specific Gravity, Urine: 1.025 (ref 1.000–1.030)
Total Protein, Urine: NEGATIVE
Urine Glucose: NEGATIVE
Urobilinogen, UA: 0.2 (ref 0.0–1.0)
WBC, UA: NONE SEEN (ref 0–?)
pH: 5.5 (ref 5.0–8.0)

## 2021-02-01 LAB — POC URINALSYSI DIPSTICK (AUTOMATED)
Bilirubin, UA: POSITIVE
Blood, UA: NEGATIVE
Glucose, UA: NEGATIVE
Ketones, UA: NEGATIVE
Leukocytes, UA: NEGATIVE
Nitrite, UA: NEGATIVE
Protein, UA: POSITIVE — AB
Spec Grav, UA: >=1.03 — AB (ref 1.010–1.025)
Urobilinogen, UA: 0.2 E.U./dL
pH, UA: 6 (ref 5.0–8.0)

## 2021-02-01 MED ORDER — METRONIDAZOLE 500 MG PO TABS: 500.0000 mg | ORAL_TABLET | Freq: Three times a day (TID) | ORAL | 0 refills | Status: AC

## 2021-02-01 MED ORDER — IOPAMIDOL (ISOVUE-370) INJECTION 76%
80.0000 mL | Freq: Once | INTRAVENOUS | Status: AC | PRN
  Administered 2021-02-01: 80 mL via INTRAVENOUS

## 2021-02-01 MED ORDER — CIPROFLOXACIN HCL 500 MG PO TABS: 500.0000 mg | ORAL_TABLET | Freq: Two times a day (BID) | ORAL | 0 refills | Status: AC

## 2021-02-01 NOTE — Assessment & Plan Note (Signed)
Acute CT scan confirms diverticulitis Started on Flagyl 500 mg 3 times daily x10 days and Cipro 500 mg twice daily x10 days given intolerance to penicillin Advised starting a probiotic Start low fiber diet-as she completes the antibiotic slowly increase fiber up to a high-fiber diet Call if symptoms do not improve or with any concerns

## 2021-02-01 NOTE — Assessment & Plan Note (Signed)
Acute 4 days LLQ pain with bloating Had diarrhea few days prior - BMs normal Some urinary frequency but no dysuria.  udip w/o obvious infection Last colonoscopy with diverticulosis Strong suspicion of diverticulitis - stat Ct Ab/pelvis  Will send urine for UA, Ucx in lab

## 2021-02-01 NOTE — Patient Instructions (Addendum)
    Medications changes include :   none   A CT scan  was ordered.   Someone from their office will call you to schedule an appointment.

## 2021-02-02 LAB — URINE CULTURE

## 2021-02-03 DIAGNOSIS — I700 Atherosclerosis of aorta: Secondary | ICD-10-CM | POA: Insufficient documentation

## 2021-02-05 ENCOUNTER — Encounter: Payer: Self-pay | Admitting: Internal Medicine

## 2021-04-09 ENCOUNTER — Other Ambulatory Visit: Payer: Self-pay | Admitting: Obstetrics and Gynecology

## 2021-04-09 ENCOUNTER — Other Ambulatory Visit: Payer: Self-pay | Admitting: Internal Medicine

## 2021-04-14 ENCOUNTER — Other Ambulatory Visit: Payer: Self-pay | Admitting: Internal Medicine

## 2021-04-16 ENCOUNTER — Other Ambulatory Visit: Payer: Self-pay | Admitting: Internal Medicine

## 2021-04-16 ENCOUNTER — Other Ambulatory Visit: Payer: Self-pay

## 2021-04-16 MED ORDER — VIBERZI 100 MG PO TABS: 1.0000 | ORAL_TABLET | Freq: Two times a day (BID) | ORAL | 2 refills | Status: DC

## 2021-04-23 DIAGNOSIS — G4733 Obstructive sleep apnea (adult) (pediatric): Secondary | ICD-10-CM | POA: Diagnosis not present

## 2021-04-29 DIAGNOSIS — R69 Illness, unspecified: Secondary | ICD-10-CM | POA: Diagnosis not present

## 2021-05-02 ENCOUNTER — Other Ambulatory Visit: Payer: Self-pay | Admitting: Internal Medicine

## 2021-05-02 ENCOUNTER — Encounter: Payer: Self-pay | Admitting: Internal Medicine

## 2021-05-02 DIAGNOSIS — Z1231 Encounter for screening mammogram for malignant neoplasm of breast: Secondary | ICD-10-CM

## 2021-05-02 MED ORDER — NYSTATIN 100000 UNIT/GM EX CREA: 1.0000 "application " | TOPICAL_CREAM | Freq: Two times a day (BID) | CUTANEOUS | 0 refills | Status: DC

## 2021-05-02 MED ORDER — AZELASTINE HCL 0.1 % NA SOLN: NASAL | 8 refills | Status: DC

## 2021-05-14 ENCOUNTER — Other Ambulatory Visit: Payer: Self-pay

## 2021-05-14 ENCOUNTER — Ambulatory Visit
Admission: RE | Admit: 2021-05-14 | Discharge: 2021-05-14 | Disposition: A | Discharge status: Home / Self Care | Payer: Medicare HMO | Source: Ambulatory Visit | Attending: Internal Medicine | Admitting: Internal Medicine

## 2021-05-14 DIAGNOSIS — Z1231 Encounter for screening mammogram for malignant neoplasm of breast: Secondary | ICD-10-CM | POA: Diagnosis not present

## 2021-05-16 ENCOUNTER — Encounter: Payer: Medicare HMO | Admitting: Internal Medicine

## 2021-05-17 ENCOUNTER — Encounter: Payer: Self-pay | Admitting: Internal Medicine

## 2021-05-19 ENCOUNTER — Encounter: Payer: Self-pay | Admitting: Family

## 2021-05-19 ENCOUNTER — Telehealth: Payer: Medicare HMO | Admitting: Family

## 2021-05-19 DIAGNOSIS — U071 COVID-19: Secondary | ICD-10-CM

## 2021-05-19 MED ORDER — MOLNUPIRAVIR EUA 200MG CAPSULE: 4.0000 | ORAL_CAPSULE | Freq: Two times a day (BID) | ORAL | 0 refills | Status: AC

## 2021-05-19 NOTE — Progress Notes (Signed)
Virtual Visit Consent   Christina Buchanan, you are scheduled for a virtual visit with a [facility] provider today.     Just as with appointments in the office, your consent must be obtained to participate.  Your consent will be active for this visit and any virtual visit you may have with one of our providers in the next 365 days.     If you have a MyChart account, a copy of this consent can be sent to you electronically.  All virtual visits are billed to your insurance company just like a traditional visit in the office.    As this is a virtual visit, video technology does not allow for your provider to perform a traditional examination.  This may limit your provider's ability to fully assess your condition.  If your provider identifies any concerns that need to be evaluated in person or the need to arrange testing (such as labs, EKG, etc.), we will make arrangements to do so.     Although advances in technology are sophisticated, we cannot ensure that it will always work on either your end or our end.  If the connection with a video visit is poor, the visit may have to be switched to a telephone visit.  With either a video or telephone visit, we are not always able to ensure that we have a secure connection.     I need to obtain your verbal consent now.   Are you willing to proceed with your visit today?    Karil Araque has provided verbal consent on 05/19/2021 for a virtual visit (video or telephone).   Evelina Dun, FNP   Date: 05/19/2021 12:01 PM   Virtual Visit via Video Note   I, Evelina Dun, connected with  Christina Buchanan  ([id number], 1956-04-26) on 05/19/21 at 12:15 PM EDT by a video-enabled telemedicine application and verified that I am speaking with the correct person using two identifiers.  Location: Patient: Virtual Visit Location Patient: Home Provider: Virtual Visit Location Provider: Home   I discussed the limitations of evaluation and management by  telemedicine and the availability of in person appointments. The patient expressed understanding and agreed to proceed.    History of Present Illness: Christina Buchanan is a 65 y.o. who identifies as a female who was assigned female at birth, and is being seen today for COVID. She reports her symptoms started Wednesday and tested positive Friday.   HPI: Cough This is a new problem. The current episode started in the past 7 days. The problem has been waxing and waning. The problem occurs every few minutes. The cough is Non-productive. Associated symptoms include ear congestion, nasal congestion, postnasal drip, a sore throat and shortness of breath. Pertinent negatives include no chills, ear pain, fever, headaches, myalgias or wheezing. The symptoms are aggravated by lying down. Risk factors for lung disease include smoking/tobacco exposure. She has tried rest and OTC cough suppressant (aleve) for the symptoms.   Problems:  Patient Active Problem List   Diagnosis Date Noted   Aortic atherosclerosis ([place]) 02/03/2021   Left lower quadrant abdominal pain 02/01/2021   Diverticulitis 02/01/2021   Bilateral hearing loss 11/22/2020   Deviated septum 09/11/2020   Acute sinus infection 01/17/2019   Hair changes 10/08/2018   Genetic testing 06/09/2018   Family history of breast cancer    IBS (irritable bowel syndrome) - D 04/05/2018   OSA (obstructive sleep apnea) 12/15/2017   Wears hearing aid in both ears 09/08/2017  Hemorrhoids 09/08/2017   Status post total abdominal hysterectomy and bilateral salpingo-oophorectomy 10/02/2016   Diabetes ([place]) 08/12/2016   Hyperlipidemia 04/29/2016   Tobacco dependence due to cigarettes 01/21/2010   Depression 01/21/2010   ALLERGIC RHINITIS 01/21/2010   Personal history of colonic polyps 07/30/2006    Allergies:  Allergies  Allergen Reactions   Penicillins Nausea And Vomiting   Metformin And Related     Loose stools   Medications:  Current  Outpatient Medications:    molnupiravir EUA 200 mg CAPS, Take 4 capsules (800 mg total) by mouth 2 (two) times daily for 5 days., Disp: 40 capsule, Rfl: 0   aspirin EC 81 MG tablet, Take 1 tablet (81 mg total) by mouth daily., Disp:  , Rfl:    azelastine (ASTELIN) 0.1 % nasal spray, PLACE 1 SPRAY INTO EACH NOSTRIL AS DIRECTED, Disp: 30 mL, Rfl: 8   cetirizine (ZYRTEC) 10 MG tablet, Take 10 mg by mouth daily as needed for allergies. , Disp: , Rfl:    Eluxadoline (VIBERZI) 100 MG TABS, Take 1 tablet (100 mg total) by mouth 2 (two) times daily., Disp: 60 tablet, Rfl: 2   escitalopram (LEXAPRO) 10 MG tablet, Take 1 tablet (10 mg total) by mouth daily. Needs appointment., Disp: 90 tablet, Rfl: 1   hydrocortisone (ANUSOL-HC) 2.5 % rectal cream, PLACE 1 APPLICATION RECTALLY 2 (TWO) TIMES DAILY., Disp: 30 g, Rfl: 2   nystatin cream (MYCOSTATIN), Apply 1 application topically 2 (two) times daily., Disp: 30 g, Rfl: 0   OZEMPIC, 1 MG/DOSE, 4 MG/3ML SOPN, INJECT 1 MG UNDER THE SKIN ONCE WEEKLY, Disp: 3 mL, Rfl: 2   simvastatin (ZOCOR) 20 MG tablet, TAKE 1 TABLET BY MOUTH DAILY AT 6PM, Disp: 90 tablet, Rfl: 3   triamcinolone (KENALOG) 0.025 % ointment, Apply 1 application topically 2 (two) times daily. Use for one to two weeks at a time., Disp: 30 g, Rfl: 1  Observations/Objective: Patient is well-developed, well-nourished in no acute distress.  Resting comfortably  at home.  Head is normocephalic, atraumatic.  No labored breathing.  Speech is clear and coherent with logical content.  Patient is alert and oriented at baseline.  Nasal congestion noted  Assessment and Plan: 1. COVID-19 virus detected - molnupiravir EUA 200 mg CAPS; Take 4 capsules (800 mg total) by mouth 2 (two) times daily for 5 days.  Dispense: 40 capsule; Refill: 0 COVID positive, rest, force fluids, tylenol as needed, Quarantine for at least 5 days and fever free, report any worsening symptoms such as increased shortness of breath,  swelling, or continued high fevers.    Follow Up Instructions: I discussed the assessment and treatment plan with the patient. The patient was provided an opportunity to ask questions and all were answered. The patient agreed with the plan and demonstrated an understanding of the instructions.  A copy of instructions were sent to the patient via MyChart.  The patient was advised to call back or seek an in-person evaluation if the symptoms worsen or if the condition fails to improve as anticipated.  Time:  I spent 6 minutes with the patient via telehealth technology discussing the above problems/concerns.    Evelina Dun, FNP

## 2021-05-21 ENCOUNTER — Encounter: Payer: Medicare HMO | Admitting: Internal Medicine

## 2021-06-02 NOTE — Patient Instructions (Addendum)
    Blood work was ordered.      Medications changes include :   none     Please followup in 6 months  

## 2021-06-02 NOTE — Progress Notes (Signed)
Subjective:    Patient ID: Christina Buchanan, female    DOB: [birth year], 65 y.o.   MRN: [id number]  HPI The patient is here for follow up of their chronic medical problems, including DM, IBS-d, OSA, depression, hichol  Hair loss - started with first covid vaccine - got better, then worse after second.  After she had covid in December and worse after her recent bout of covid.    Medications and allergies reviewed with patient and updated if appropriate.  Patient Active Problem List   Diagnosis Date Noted   Aortic atherosclerosis ([place]) 02/03/2021   Left lower quadrant abdominal pain 02/01/2021   Diverticulitis 02/01/2021   Bilateral hearing loss 11/22/2020   Deviated septum 09/11/2020   Acute sinus infection 01/17/2019   Hair changes 10/08/2018   Genetic testing 06/09/2018   Family history of breast cancer    IBS (irritable bowel syndrome) - D 04/05/2018   OSA (obstructive sleep apnea) 12/15/2017   Wears hearing aid in both ears 09/08/2017   Hemorrhoids 09/08/2017   Status post total abdominal hysterectomy and bilateral salpingo-oophorectomy 10/02/2016   Diabetes ([place]) 08/12/2016   Hyperlipidemia 04/29/2016   Tobacco dependence due to cigarettes 01/21/2010   Depression 01/21/2010   ALLERGIC RHINITIS 01/21/2010   Personal history of colonic polyps 07/30/2006    Current Outpatient Medications on File Prior to Visit  Medication Sig Dispense Refill   aspirin EC 81 MG tablet Take 1 tablet (81 mg total) by mouth daily.     azelastine (ASTELIN) 0.1 % nasal spray PLACE 1 SPRAY INTO EACH NOSTRIL AS DIRECTED 30 mL 8   Biotin w/ Vitamins C & E (HAIR SKIN & NAILS GUMMIES PO) Take by mouth.     cetirizine (ZYRTEC) 10 MG tablet Take 10 mg by mouth daily as needed for allergies.      COLLAGEN PO Take by mouth. Gummy once a day     Eluxadoline (VIBERZI) 100 MG TABS Take 1 tablet (100 mg total) by mouth 2 (two) times daily. 60 tablet 2   escitalopram (LEXAPRO) 10 MG tablet Take 1 tablet  (10 mg total) by mouth daily. Needs appointment. 90 tablet 1   hydrocortisone (ANUSOL-HC) 2.5 % rectal cream PLACE 1 APPLICATION RECTALLY 2 (TWO) TIMES DAILY. 30 g 2   nystatin cream (MYCOSTATIN) Apply 1 application topically 2 (two) times daily. 30 g 0   OZEMPIC, 1 MG/DOSE, 4 MG/3ML SOPN INJECT 1 MG UNDER THE SKIN ONCE WEEKLY 3 mL 2   simvastatin (ZOCOR) 20 MG tablet TAKE 1 TABLET BY MOUTH DAILY AT 6PM 90 tablet 3   triamcinolone (KENALOG) 0.025 % ointment Apply 1 application topically 2 (two) times daily. Use for one to two weeks at a time. 30 g 1   No current facility-administered medications on file prior to visit.    Past Medical History:  Diagnosis Date   ALLERGIC RHINITIS    Arthritis    hands   DEPRESSION    Diabetes mellitus without complication ([place]) 123456   diet controlled   Family history of breast cancer    Fibroid, uterine 04/29/2016   HYPERLIPIDEMIA    OSA (obstructive sleep apnea) 12/15/2017   Osteopenia    Personal history of colonic polyps    SMOKER    VITAMIN D DEFICIENCY     Past Surgical History:  Procedure Laterality Date   ABDOMINAL HYSTERECTOMY N/A 10/02/2016   Procedure: HYSTERECTOMY ABDOMINAL;  Surgeon: Nunzio Cobbs, MD;  Location: [facility];  Service: Gynecology;  Laterality: N/A;  4 hours   ABDOMINAL SACROCOLPOPEXY N/A 10/02/2016   Procedure: ABDOMINO SACROCOLPOPEXY;  Surgeon: Nunzio Cobbs, MD;  Location: [facility];  Service: Gynecology;  Laterality: N/A;   ANTERIOR AND POSTERIOR REPAIR N/A 10/02/2016   Procedure: ANTERIOR (CYSTOCELE) AND POSTERIOR REPAIR (RECTOCELE);  Surgeon: Nunzio Cobbs, MD;  Location: [facility];  Service: Gynecology;  Laterality: N/A;   BLADDER SUSPENSION N/A 10/02/2016   Procedure: TRANSVAGINAL TAPE (TVT) PROCEDURE exact midurethral sling;  Surgeon: Nunzio Cobbs, MD;  Location: [facility];  Service: Gynecology;  Laterality: N/A;   BREAST LUMPECTOMY WITH RADIOACTIVE SEED LOCALIZATION Left 09/08/2019    Procedure: LEFT BREAST LUMPECTOMY WITH RADIOACTIVE SEED LOCALIZATION;  Surgeon: Coralie Keens, MD;  Location: [facility];  Service: General;  Laterality: Left;   BREAST SURGERY  1990   breast biopsy   COLONOSCOPY W/ BIOPSIES  9/07   2 polyps & tubuvillous adenoma   COLONOSCOPY W/ BIOPSIES  11/13/08   sigmoid ddiverticuli and rechek in 5 years - Dr. 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   SALPINGOOPHORECTOMY Bilateral 10/02/2016   Procedure: SALPINGO OOPHORECTOMY;  Surgeon: Nunzio Cobbs, MD;  Location: [facility];  Service: Gynecology;  Laterality: Bilateral;   UPPER GASTROINTESTINAL ENDOSCOPY  06/15/06   2 polyps - H pylori    Social History   Socioeconomic History   Marital status: Married    Spouse name: Not on file   Number of children: Not on file   Years of education: Not on file   Highest education level: Not on file  Occupational History   Not on file  Tobacco Use   Smoking status: Every Day    Packs/day: 0.30    Years: 30.00    Pack years: 9.00    Types: Cigarettes   Smokeless tobacco: Never   Tobacco comments:    pt smokes 5 cigarettes per day. Married, lives with spouse. Cares for elderly parent and in-laws  Vaping Use   Vaping Use: Never used  Substance and Sexual Activity   Alcohol use: Yes    Alcohol/week: 2.0 standard drinks    Types: 2 Standard drinks or equivalent per week    Comment: Rare glass of wine every 2-3 weeks   Drug use: No   Sexual activity: Yes    Partners: Male    Birth control/protection: Other-see comments, Post-menopausal    Comment: husband had vasectomy  Other Topics Concern   Not on file  Social History Narrative   Lives with husband in a one story  home.  She has 3 grown children.     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     Education: high school.        No regular exercise   Social Determinants of Health   Financial Resource Strain: Not on file  Food Insecurity: Not on file  Transportation Needs: Not on file  Physical Activity: Not on file  Stress: Not on file  Social Connections: Not on file    Family History  Problem Relation Age of Onset   Breast cancer Mother 44   Diabetes Mother    Stroke Mother  Hyperlipidemia Mother    Heart failure Mother    Hypertension Mother    COPD Mother    Hypertension Father    Heart disease Father    Hyperlipidemia Father    Heart failure Father        dec at age 60   Breast cancer Sister 62   Heart disease Sister        Afib   Psoriasis Son     Review of Systems  Constitutional:  Negative for chills, fatigue and fever.  Respiratory:  Negative for cough, shortness of breath and wheezing.   Cardiovascular:  Negative for chest pain, palpitations and leg swelling.  Neurological:  Negative for light-headedness and headaches.      Objective:   Vitals:   06/03/21 1452  BP: 134/70  Pulse: 77  Temp: 98.5 F (36.9 C)  SpO2: 96%   BP Readings from Last 3 Encounters:  06/03/21 134/70  02/01/21 124/72  11/22/20 130/76   Wt Readings from Last 3 Encounters:  06/03/21 149 lb (67.6 kg)  02/01/21 147 lb 12.8 oz (67 kg)  11/22/20 148 lb (67.1 kg)   Body mass index is 26.39 kg/m.   Physical Exam    Constitutional: Appears well-developed and well-nourished. No distress.  HENT:  Head: Normocephalic and atraumatic.  Neck: Neck supple. No tracheal deviation present. No thyromegaly present.  No cervical lymphadenopathy Cardiovascular: Normal rate, regular rhythm and normal heart sounds.   No murmur heard. No carotid bruit .  No edema Pulmonary/Chest: Effort normal and breath sounds normal. No respiratory distress. No has no wheezes. No rales.  Skin: Skin is warm and  dry. Not diaphoretic.  Psychiatric: Normal mood and affect. Behavior is normal.      Assessment & Plan:    See Problem List for Assessment and Plan of chronic medical problems.    This visit occurred during the SARS-CoV-2 public health emergency.  Safety protocols were in place, including screening questions prior to the visit, additional usage of staff PPE, and extensive cleaning of exam room while observing appropriate contact time as indicated for disinfecting solutions.

## 2021-06-03 ENCOUNTER — Ambulatory Visit (INDEPENDENT_AMBULATORY_CARE_PROVIDER_SITE_OTHER): Payer: Medicare HMO | Admitting: Internal Medicine

## 2021-06-03 ENCOUNTER — Encounter: Payer: Self-pay | Admitting: Internal Medicine

## 2021-06-03 ENCOUNTER — Other Ambulatory Visit: Payer: Self-pay

## 2021-06-03 VITALS — BP 134/70 | HR 77 | Temp 98.5°F | Ht 63.0 in | Wt 149.0 lb

## 2021-06-03 DIAGNOSIS — G4733 Obstructive sleep apnea (adult) (pediatric): Secondary | ICD-10-CM | POA: Diagnosis not present

## 2021-06-03 DIAGNOSIS — K580 Irritable bowel syndrome with diarrhea: Secondary | ICD-10-CM | POA: Diagnosis not present

## 2021-06-03 DIAGNOSIS — L659 Nonscarring hair loss, unspecified: Secondary | ICD-10-CM

## 2021-06-03 DIAGNOSIS — E119 Type 2 diabetes mellitus without complications: Secondary | ICD-10-CM

## 2021-06-03 DIAGNOSIS — F3289 Other specified depressive episodes: Secondary | ICD-10-CM | POA: Diagnosis not present

## 2021-06-03 DIAGNOSIS — R69 Illness, unspecified: Secondary | ICD-10-CM | POA: Diagnosis not present

## 2021-06-03 DIAGNOSIS — E782 Mixed hyperlipidemia: Secondary | ICD-10-CM

## 2021-06-03 LAB — TSH: TSH: 1.13 u[IU]/mL (ref 0.35–5.50)

## 2021-06-03 LAB — CBC WITH DIFFERENTIAL/PLATELET
Basophils Absolute: 0 10*3/uL (ref 0.0–0.1)
Basophils Relative: 0.5 % (ref 0.0–3.0)
Eosinophils Absolute: 0.1 10*3/uL (ref 0.0–0.7)
Eosinophils Relative: 1.5 % (ref 0.0–5.0)
HCT: 39.8 % (ref 36.0–46.0)
Hemoglobin: 13.7 g/dL (ref 12.0–15.0)
Lymphocytes Relative: 31.5 % (ref 12.0–46.0)
Lymphs Abs: 2 10*3/uL (ref 0.7–4.0)
MCHC: 34.5 g/dL (ref 30.0–36.0)
MCV: 93.5 fl (ref 78.0–100.0)
Monocytes Absolute: 0.5 10*3/uL (ref 0.1–1.0)
Monocytes Relative: 8.4 % (ref 3.0–12.0)
Neutro Abs: 3.7 10*3/uL (ref 1.4–7.7)
Neutrophils Relative %: 58.1 % (ref 43.0–77.0)
Platelets: 254 10*3/uL (ref 150.0–400.0)
RBC: 4.26 Mil/uL (ref 3.87–5.11)
RDW: 12.7 % (ref 11.5–15.5)
WBC: 6.3 10*3/uL (ref 4.0–10.5)

## 2021-06-03 LAB — COMPREHENSIVE METABOLIC PANEL
ALT: 25 U/L (ref 0–35)
AST: 21 U/L (ref 0–37)
Albumin: 4.3 g/dL (ref 3.5–5.2)
Alkaline Phosphatase: 49 U/L (ref 39–117)
BUN: 21 mg/dL (ref 6–23)
CO2: 26 mEq/L (ref 19–32)
Calcium: 9.4 mg/dL (ref 8.4–10.5)
Chloride: 106 mEq/L (ref 96–112)
Creatinine, Ser: 0.62 mg/dL (ref 0.40–1.20)
GFR: 92.85 mL/min (ref >60.00)
Glucose, Bld: 114 mg/dL — ABNORMAL HIGH (ref 70–99)
Potassium: 3.6 mEq/L (ref 3.5–5.1)
Sodium: 141 mEq/L (ref 135–145)
Total Bilirubin: 0.6 mg/dL (ref 0.2–1.2)
Total Protein: 7.2 g/dL (ref 6.0–8.3)

## 2021-06-03 LAB — MICROALBUMIN / CREATININE URINE RATIO
Creatinine,U: 113.1 mg/dL
Microalb Creat Ratio: 2.2 mg/g (ref 0.0–30.0)
Microalb, Ur: 2.5 mg/dL — ABNORMAL HIGH (ref 0.0–1.9)

## 2021-06-03 LAB — HEMOGLOBIN A1C: Hgb A1c MFr Bld: 6.1 % (ref 4.6–6.5)

## 2021-06-03 NOTE — Assessment & Plan Note (Signed)
Chronic Using CPAP nightly 

## 2021-06-03 NOTE — Assessment & Plan Note (Signed)
Chronic °Controlled, stable °Continue Lexapro 10 mg daily °

## 2021-06-03 NOTE — Assessment & Plan Note (Addendum)
Chronic Controlled Continue Ozempic 1 mg weekly Stressed regular exercise, diabetic diet A1c, urine micro

## 2021-06-03 NOTE — Assessment & Plan Note (Signed)
Acute Diffuse hair loss over months She feels it started with COVID vaccines and worse with having COVID x2 Will rule out other causes-check CBC, TSH, CMP, iron levels, B12 Will see dermatology

## 2021-06-03 NOTE — Assessment & Plan Note (Signed)
Chronic Check lipid panel  Continue simvastatin 20 mg daily regular exercise and healthy diet encouraged

## 2021-06-03 NOTE — Assessment & Plan Note (Signed)
Chronic Controlled Continue Viberzi 100 mg twice daily

## 2021-06-04 LAB — IRON,TIBC AND FERRITIN PANEL
%SAT: 30 % (calc) (ref 16–45)
Ferritin: 48 ng/mL (ref 16–288)
Iron: 106 ug/dL (ref 45–160)
TIBC: 356 mcg/dL (calc) (ref 250–450)

## 2021-06-04 LAB — VITAMIN B12: Vitamin B-12: 384 pg/mL (ref 211–911)

## 2021-06-24 ENCOUNTER — Other Ambulatory Visit: Payer: Self-pay | Admitting: Internal Medicine

## 2021-06-25 ENCOUNTER — Encounter: Payer: Self-pay | Admitting: Internal Medicine

## 2021-06-26 ENCOUNTER — Telehealth: Payer: Medicare HMO | Admitting: Family Medicine

## 2021-07-16 ENCOUNTER — Other Ambulatory Visit: Payer: Self-pay | Admitting: Internal Medicine

## 2021-07-22 DIAGNOSIS — M79672 Pain in left foot: Secondary | ICD-10-CM | POA: Diagnosis not present

## 2021-07-25 DIAGNOSIS — G4733 Obstructive sleep apnea (adult) (pediatric): Secondary | ICD-10-CM | POA: Diagnosis not present

## 2021-09-03 ENCOUNTER — Telehealth: Payer: Medicare HMO | Admitting: Physician Assistant

## 2021-09-03 DIAGNOSIS — R6889 Other general symptoms and signs: Secondary | ICD-10-CM | POA: Diagnosis not present

## 2021-09-03 DIAGNOSIS — Z20828 Contact with and (suspected) exposure to other viral communicable diseases: Secondary | ICD-10-CM

## 2021-09-03 MED ORDER — OSELTAMIVIR PHOSPHATE 75 MG PO CAPS
75.0000 mg | ORAL_CAPSULE | Freq: Two times a day (BID) | ORAL | 0 refills | Status: DC
Start: 2021-09-03 — End: 2021-12-05

## 2021-09-03 NOTE — Progress Notes (Signed)
I have spent 5 minutes in review of e-visit questionnaire, review and updating patient chart, medical decision making and response to patient.   Sitlaly Gudiel Cody Deniah Saia, PA-C    

## 2021-09-03 NOTE — Progress Notes (Signed)

## 2021-09-05 DIAGNOSIS — Z0100 Encounter for examination of eyes and vision without abnormal findings: Secondary | ICD-10-CM | POA: Diagnosis not present

## 2021-09-05 DIAGNOSIS — E119 Type 2 diabetes mellitus without complications: Secondary | ICD-10-CM | POA: Diagnosis not present

## 2021-09-16 ENCOUNTER — Encounter: Payer: Self-pay | Admitting: Internal Medicine

## 2021-09-16 ENCOUNTER — Other Ambulatory Visit: Payer: Self-pay

## 2021-09-16 ENCOUNTER — Other Ambulatory Visit: Payer: Self-pay | Admitting: Internal Medicine

## 2021-09-16 MED ORDER — VIBERZI 100 MG PO TABS: 1.0000 | ORAL_TABLET | Freq: Two times a day (BID) | ORAL | 2 refills | Status: DC

## 2021-09-18 ENCOUNTER — Encounter: Payer: Self-pay | Admitting: Internal Medicine

## 2021-09-18 DIAGNOSIS — L821 Other seborrheic keratosis: Secondary | ICD-10-CM | POA: Diagnosis not present

## 2021-09-18 DIAGNOSIS — L814 Other melanin hyperpigmentation: Secondary | ICD-10-CM | POA: Diagnosis not present

## 2021-09-18 DIAGNOSIS — D2271 Melanocytic nevi of right lower limb, including hip: Secondary | ICD-10-CM | POA: Diagnosis not present

## 2021-09-18 DIAGNOSIS — D225 Melanocytic nevi of trunk: Secondary | ICD-10-CM | POA: Diagnosis not present

## 2021-09-18 DIAGNOSIS — C44612 Basal cell carcinoma of skin of right upper limb, including shoulder: Secondary | ICD-10-CM | POA: Diagnosis not present

## 2021-09-18 DIAGNOSIS — Z85828 Personal history of other malignant neoplasm of skin: Secondary | ICD-10-CM | POA: Diagnosis not present

## 2021-09-18 DIAGNOSIS — L820 Inflamed seborrheic keratosis: Secondary | ICD-10-CM | POA: Diagnosis not present

## 2021-09-18 DIAGNOSIS — D1801 Hemangioma of skin and subcutaneous tissue: Secondary | ICD-10-CM | POA: Diagnosis not present

## 2021-09-18 DIAGNOSIS — D2272 Melanocytic nevi of left lower limb, including hip: Secondary | ICD-10-CM | POA: Diagnosis not present

## 2021-10-18 ENCOUNTER — Other Ambulatory Visit: Payer: Self-pay | Admitting: Internal Medicine

## 2021-10-28 DIAGNOSIS — G4733 Obstructive sleep apnea (adult) (pediatric): Secondary | ICD-10-CM | POA: Diagnosis not present

## 2021-12-04 ENCOUNTER — Encounter: Payer: Self-pay | Admitting: Internal Medicine

## 2021-12-04 DIAGNOSIS — M8580 Other specified disorders of bone density and structure, unspecified site: Secondary | ICD-10-CM | POA: Insufficient documentation

## 2021-12-04 NOTE — Patient Instructions (Addendum)
? ? ? ?Blood work was ordered.   ? ? ?Medications changes include :   none ? ? ? ?Return in about 6 months (around 06/07/2022) for 6 month f/u. ? ? ? ?Health Maintenance, Female ?Adopting a healthy lifestyle and getting preventive care are important in promoting health and wellness. Ask your health care provider about: ?The right schedule for you to have regular tests and exams. ?Things you can do on your own to prevent diseases and keep yourself healthy. ?What should I know about diet, weight, and exercise? ?Eat a healthy diet ? ?Eat a diet that includes plenty of vegetables, fruits, low-fat dairy products, and lean protein. ?Do not eat a lot of foods that are high in solid fats, added sugars, or sodium. ?Maintain a healthy weight ?Body mass index (BMI) is used to identify weight problems. It estimates body fat based on height and weight. Your health care provider can help determine your BMI and help you achieve or maintain a healthy weight. ?Get regular exercise ?Get regular exercise. This is one of the most important things you can do for your health. Most adults should: ?Exercise for at least 150 minutes each week. The exercise should increase your heart rate and make you sweat (moderate-intensity exercise). ?Do strengthening exercises at least twice a week. This is in addition to the moderate-intensity exercise. ?Spend less time sitting. Even light physical activity can be beneficial. ?Watch cholesterol and blood lipids ?Have your blood tested for lipids and cholesterol at 67 years of age, then have this test every 5 years. ?Have your cholesterol levels checked more often if: ?Your lipid or cholesterol levels are high. ?You are older than 67 years of age. ?You are at high risk for heart disease. ?What should I know about cancer screening? ?Depending on your health history and family history, you may need to have cancer screening at various ages. This may include screening for: ?Breast cancer. ?Cervical  cancer. ?Colorectal cancer. ?Skin cancer. ?Lung cancer. ?What should I know about heart disease, diabetes, and high blood pressure? ?Blood pressure and heart disease ?High blood pressure causes heart disease and increases the risk of stroke. This is more likely to develop in people who have high blood pressure readings or are overweight. ?Have your blood pressure checked: ?Every 3-5 years if you are 2-64 years of age. ?Every year if you are 21 years old or older. ?Diabetes ?Have regular diabetes screenings. This checks your fasting blood sugar level. Have the screening done: ?Once every three years after age 20 if you are at a normal weight and have a low risk for diabetes. ?More often and at a younger age if you are overweight or have a high risk for diabetes. ?What should I know about preventing infection? ?Hepatitis B ?If you have a higher risk for hepatitis B, you should be screened for this virus. Talk with your health care provider to find out if you are at risk for hepatitis B infection. ?Hepatitis C ?Testing is recommended for: ?Everyone born from [birth year] through 1966. ?Anyone with known risk factors for hepatitis C. ?Sexually transmitted infections (STIs) ?Get screened for STIs, including gonorrhea and chlamydia, if: ?You are sexually active and are younger than 67 years of age. ?You are older than 67 years of age and your health care provider tells you that you are at risk for this type of infection. ?Your sexual activity has changed since you were last screened, and you are at increased risk for chlamydia or gonorrhea. Ask your  health care provider if you are at risk. ?Ask your health care provider about whether you are at high risk for HIV. Your health care provider may recommend a prescription medicine to help prevent HIV infection. If you choose to take medicine to prevent HIV, you should first get tested for HIV. You should then be tested every 3 months for as long as you are taking the  medicine. ?Pregnancy ?If you are about to stop having your period (premenopausal) and you may become pregnant, seek counseling before you get pregnant. ?Take 400 to 800 micrograms (mcg) of folic acid every day if you become pregnant. ?Ask for birth control (contraception) if you want to prevent pregnancy. ?Osteoporosis and menopause ?Osteoporosis is a disease in which the bones lose minerals and strength with aging. This can result in bone fractures. If you are 80 years old or older, or if you are at risk for osteoporosis and fractures, ask your health care provider if you should: ?Be screened for bone loss. ?Take a calcium or vitamin D supplement to lower your risk of fractures. ?Be given hormone replacement therapy (HRT) to treat symptoms of menopause. ?Follow these instructions at home: ?Alcohol use ?Do not drink alcohol if: ?Your health care provider tells you not to drink. ?You are pregnant, may be pregnant, or are planning to become pregnant. ?If you drink alcohol: ?Limit how much you have to: ?0-1 drink a day. ?Know how much alcohol is in your drink. In the [place] one drink equals one 12 oz bottle of beer (355 mL), one 5 oz glass of wine (148 mL), or one 1? oz glass of hard liquor (44 mL). ?Lifestyle ?Do not use any products that contain nicotine or tobacco. These products include cigarettes, chewing tobacco, and vaping devices, such as e-cigarettes. If you need help quitting, ask your health care provider. ?Do not use street drugs. ?Do not share needles. ?Ask your health care provider for help if you need support or information about quitting drugs. ?General instructions ?Schedule regular health, dental, and eye exams. ?Stay current with your vaccines. ?Tell your health care provider if: ?You often feel depressed. ?You have ever been abused or do not feel safe at home. ?Summary ?Adopting a healthy lifestyle and getting preventive care are important in promoting health and wellness. ?Follow your health care  provider's instructions about healthy diet, exercising, and getting tested or screened for diseases. ?Follow your health care provider's instructions on monitoring your cholesterol and blood pressure. ?This information is not intended to replace advice given to you by your health care provider. Make sure you discuss any questions you have with your health care provider. ?Document Revised: 02/11/2021 Document Reviewed: 02/11/2021 ?Elsevier Patient Education ? Experiment. ? ?

## 2021-12-04 NOTE — Progress Notes (Signed)
? ? ?Subjective:  ? ? Patient ID: Christina Buchanan, female    DOB: 10-Oct-1955, 66 y.o.   MRN: [id number] ? ? ?This visit occurred during the SARS-CoV-2 public health emergency.  Safety protocols were in place, including screening questions prior to the visit, additional usage of staff PPE, and extensive cleaning of exam room while observing appropriate contact time as indicated for disinfecting solutions. ? ? ? ?HPI ?Christina Buchanan is here for  ?Chief Complaint  ?Patient presents with  ? Annual Exam  ? ?She is doing well and has no concerns.  ? ? ?Still smoking 5 or less cigs.  Only smokes at night.  She has no desire to quit. ? ? ? ?Medications and allergies reviewed with patient and updated if appropriate. ? ? ? ?Current Outpatient Medications on File Prior to Visit  ?Medication Sig Dispense Refill  ? aspirin EC 81 MG tablet Take 1 tablet (81 mg total) by mouth daily.    ? azelastine (ASTELIN) 0.1 % nasal spray PLACE 1 SPRAY INTO EACH NOSTRIL AS DIRECTED 30 mL 8  ? Biotin w/ Vitamins C & E (HAIR SKIN & NAILS GUMMIES PO) Take by mouth.    ? cetirizine (ZYRTEC) 10 MG tablet Take 10 mg by mouth daily as needed for allergies.     ? COLLAGEN PO Take by mouth. Gummy once a day    ? Eluxadoline (VIBERZI) 100 MG TABS Take 1 tablet (100 mg total) by mouth 2 (two) times daily. 60 tablet 2  ? escitalopram (LEXAPRO) 10 MG tablet TAKE ONE TABLET BY MOUTH DAILY (PLEASE SCHEDULE APPOINTMENT WITH MD) 90 tablet 1  ? hydrocortisone (ANUSOL-HC) 2.5 % rectal cream PLACE 1 APPLICATION RECTALLY 2 (TWO) TIMES DAILY. 30 g 2  ? nystatin cream (MYCOSTATIN) Apply 1 application topically 2 (two) times daily. 30 g 0  ? OZEMPIC, 1 MG/DOSE, 4 MG/3ML SOPN INJECT 1 MG UNDER THE SKIN ONCE WEEKLY 3 mL 2  ? simvastatin (ZOCOR) 20 MG tablet TAKE 1 TABLET BY MOUTH DAILY AT 6PM 90 tablet 3  ? triamcinolone (KENALOG) 0.025 % ointment Apply 1 application topically 2 (two) times daily. Use for one to two weeks at a time. 30 g 1  ? ?No current  facility-administered medications on file prior to visit.  ? ? ?Review of Systems  ?Constitutional:  Negative for fever.  ?Eyes:  Negative for visual disturbance.  ?Respiratory:  Negative for cough, shortness of breath and wheezing.   ?Cardiovascular:  Negative for chest pain, palpitations and leg swelling.  ?Gastrointestinal:  Negative for abdominal pain, blood in stool, constipation, diarrhea and nausea.  ?     Rare gerd  ?Genitourinary:  Negative for dysuria.  ?Musculoskeletal:  Positive for arthralgias. Negative for back pain (mild).  ?Skin:  Negative for rash.  ?Neurological:  Negative for light-headedness, numbness and headaches.  ?Psychiatric/Behavioral:  Negative for dysphoric mood. The patient is not nervous/anxious.   ? ?   ?Objective:  ? ?Vitals:  ? 12/05/21 1000  ?BP: 118/80  ?Pulse: 70  ?Temp: 98.2 ?F (36.8 ?C)  ?SpO2: 96%  ? ?Filed Weights  ? 12/05/21 1000  ?Weight: 149 lb 3.2 oz (67.7 kg)  ? ?Body mass index is 26.43 kg/m?. ? ?BP Readings from Last 3 Encounters:  ?12/05/21 118/80  ?06/03/21 134/70  ?02/01/21 124/72  ? ? ?Wt Readings from Last 3 Encounters:  ?12/05/21 149 lb 3.2 oz (67.7 kg)  ?06/03/21 149 lb (67.6 kg)  ?02/01/21 147 lb 12.8 oz (67 kg)  ? ? ?  Depression screen [facility] 2/9 12/05/2021 11/06/2020 04/11/2019 09/08/2017 09/08/2016  ?Decreased Interest 0 0 0 0 0  ?Down, Depressed, Hopeless 0 0 0 0 0  ?PHQ - 2 Score 0 0 0 0 0  ?Altered sleeping - 0 - - -  ?Tired, decreased energy - 0 - - -  ?Change in appetite - 0 - - -  ?Feeling bad or failure about yourself  - 0 - - -  ?Trouble concentrating - 0 - - -  ?Moving slowly or fidgety/restless - 0 - - -  ?Suicidal thoughts - 0 - - -  ?PHQ-9 Score - 0 - - -  ?Difficult doing work/chores - Not difficult at all - - -  ?Some recent data might be hidden  ? ? ? ?No flowsheet data found. ? ? ? ?  ?Physical Exam ?Constitutional: She appears well-developed and well-nourished. No distress.  ?HENT:  ?Head: Normocephalic and atraumatic.  ?Right Ear: External ear normal.  Normal ear canal and TM ?Left Ear: External ear normal.  Normal ear canal and TM ?Mouth/Throat: Oropharynx is clear and moist.  ?Eyes: Conjunctivae and EOM are normal.  ?Neck: Neck supple. No tracheal deviation present. No thyromegaly present.  ?No carotid bruit  ?Cardiovascular: Normal rate, regular rhythm and normal heart sounds.   ?No murmur heard.  No edema. ?Pulmonary/Chest: Effort normal and breath sounds normal. No respiratory distress. She has no wheezes. She has no rales.  ?Breast: deferred   ?Abdominal: Soft. She exhibits no distension. There is no tenderness.  ?Lymphadenopathy: She has no cervical adenopathy.  ?Skin: Skin is warm and dry. She is not diaphoretic.  ?Psychiatric: She has a normal mood and affect. Her behavior is normal.  ? ? ?Diabetic Foot Exam - Simple   ?Simple Foot Form ?Diabetic Foot exam was performed with the following findings: Yes 12/05/2021 12:15 PM  ?Visual Inspection ?No deformities, no ulcerations, no other skin breakdown bilaterally: Yes ?Sensation Testing ?Intact to touch and monofilament testing bilaterally: Yes ?Pulse Check ?Posterior Tibialis and Dorsalis pulse intact bilaterally: Yes ?Comments ?  ?  ? ?Lab Results  ?Component Value Date  ? WBC 6.3 06/03/2021  ? HGB 13.7 06/03/2021  ? HCT 39.8 06/03/2021  ? PLT 254.0 06/03/2021  ? GLUCOSE 114 (H) 06/03/2021  ? CHOL 125 05/10/2020  ? TRIG 61.0 05/10/2020  ? HDL 44.50 05/10/2020  ? LDLDIRECT 153.6 12/12/2011  ? [place] 69 05/10/2020  ? ALT 25 06/03/2021  ? AST 21 06/03/2021  ? NA 141 06/03/2021  ? K 3.6 06/03/2021  ? CL 106 06/03/2021  ? CREATININE 0.62 06/03/2021  ? BUN 21 06/03/2021  ? CO2 26 06/03/2021  ? TSH 1.13 06/03/2021  ? HGBA1C 6.1 06/03/2021  ? MICROALBUR 2.5 (H) 06/03/2021  ? ? ? ? ?   ?Assessment & Plan:  ? ?Physical exam: ?Screening blood work  ordered ?Exercise  none, but very active ?Weight  ok for age ?Substance abuse  none ? ? ?Reviewed recommended immunizations. ? ? ?Health Maintenance  ?Topic Date Due  ?  COLONOSCOPY (Pts 45-73yrs Insurance coverage will need to be confirmed)  09/23/2020  ? OPHTHALMOLOGY EXAM  03/21/2021  ? DEXA SCAN  04/09/2021  ? HEMOGLOBIN A1C  12/03/2021  ? INFLUENZA VACCINE  01/03/2022 (Originally 05/06/2021)  ? TETANUS/TDAP  02/16/2022  ? MAMMOGRAM  05/14/2022  ? URINE MICROALBUMIN  06/03/2022  ? FOOT EXAM  12/06/2022  ? Hepatitis C Screening  Completed  ? HPV VACCINES  Aged Out  ? Pneumonia Vaccine 65+ Years  old  Discontinued  ? COVID-19 Vaccine  Discontinued  ? Zoster Vaccines- Shingrix  Discontinued  ?  ? ? ? ? ? ? ?See Problem List for Assessment and Plan of chronic medical problems. ? ? ? ? ? ?

## 2021-12-05 ENCOUNTER — Ambulatory Visit (INDEPENDENT_AMBULATORY_CARE_PROVIDER_SITE_OTHER): Payer: Medicare HMO | Admitting: Internal Medicine

## 2021-12-05 ENCOUNTER — Other Ambulatory Visit: Payer: Self-pay

## 2021-12-05 VITALS — BP 118/80 | HR 70 | Temp 98.2°F | Ht 63.0 in | Wt 149.2 lb

## 2021-12-05 DIAGNOSIS — E119 Type 2 diabetes mellitus without complications: Secondary | ICD-10-CM

## 2021-12-05 DIAGNOSIS — F3289 Other specified depressive episodes: Secondary | ICD-10-CM

## 2021-12-05 DIAGNOSIS — K580 Irritable bowel syndrome with diarrhea: Secondary | ICD-10-CM | POA: Diagnosis not present

## 2021-12-05 DIAGNOSIS — I700 Atherosclerosis of aorta: Secondary | ICD-10-CM

## 2021-12-05 DIAGNOSIS — E782 Mixed hyperlipidemia: Secondary | ICD-10-CM | POA: Diagnosis not present

## 2021-12-05 DIAGNOSIS — R69 Illness, unspecified: Secondary | ICD-10-CM | POA: Diagnosis not present

## 2021-12-05 DIAGNOSIS — Z0000 Encounter for general adult medical examination without abnormal findings: Secondary | ICD-10-CM

## 2021-12-05 DIAGNOSIS — G4733 Obstructive sleep apnea (adult) (pediatric): Secondary | ICD-10-CM | POA: Diagnosis not present

## 2021-12-05 DIAGNOSIS — M85851 Other specified disorders of bone density and structure, right thigh: Secondary | ICD-10-CM

## 2021-12-05 DIAGNOSIS — F17210 Nicotine dependence, cigarettes, uncomplicated: Secondary | ICD-10-CM

## 2021-12-05 LAB — COMPREHENSIVE METABOLIC PANEL
ALT: 23 U/L (ref 0–35)
AST: 23 U/L (ref 0–37)
Albumin: 4.5 g/dL (ref 3.5–5.2)
Alkaline Phosphatase: 51 U/L (ref 39–117)
BUN: 14 mg/dL (ref 6–23)
CO2: 29 mEq/L (ref 19–32)
Calcium: 9.4 mg/dL (ref 8.4–10.5)
Chloride: 104 mEq/L (ref 96–112)
Creatinine, Ser: 0.71 mg/dL (ref 0.40–1.20)
GFR: 88.34 mL/min (ref >60.00)
Glucose, Bld: 99 mg/dL (ref 70–99)
Potassium: 4 mEq/L (ref 3.5–5.1)
Sodium: 140 mEq/L (ref 135–145)
Total Bilirubin: 0.7 mg/dL (ref 0.2–1.2)
Total Protein: 7.1 g/dL (ref 6.0–8.3)

## 2021-12-05 LAB — LIPID PANEL
Cholesterol: 140 mg/dL (ref 0–200)
HDL: 46.3 mg/dL (ref >39.00)
LDL Cholesterol: 78 mg/dL (ref 0–99)
NonHDL: 94.02
Total CHOL/HDL Ratio: 3
Triglycerides: 80 mg/dL (ref 0.0–149.0)
VLDL: 16 mg/dL (ref 0.0–40.0)

## 2021-12-05 LAB — MICROALBUMIN / CREATININE URINE RATIO
Creatinine,U: 54.5 mg/dL
Microalb Creat Ratio: 1.4 mg/g (ref 0.0–30.0)
Microalb, Ur: 0.7 mg/dL (ref 0.0–1.9)

## 2021-12-05 LAB — CBC WITH DIFFERENTIAL/PLATELET
Basophils Absolute: 0 10*3/uL (ref 0.0–0.1)
Basophils Relative: 0.3 % (ref 0.0–3.0)
Eosinophils Absolute: 0.1 10*3/uL (ref 0.0–0.7)
Eosinophils Relative: 1.3 % (ref 0.0–5.0)
HCT: 41.4 % (ref 36.0–46.0)
Hemoglobin: 13.9 g/dL (ref 12.0–15.0)
Lymphocytes Relative: 34.3 % (ref 12.0–46.0)
Lymphs Abs: 2.1 10*3/uL (ref 0.7–4.0)
MCHC: 33.7 g/dL (ref 30.0–36.0)
MCV: 95.3 fl (ref 78.0–100.0)
Monocytes Absolute: 0.6 10*3/uL (ref 0.1–1.0)
Monocytes Relative: 10.1 % (ref 3.0–12.0)
Neutro Abs: 3.3 10*3/uL (ref 1.4–7.7)
Neutrophils Relative %: 54 % (ref 43.0–77.0)
Platelets: 244 10*3/uL (ref 150.0–400.0)
RBC: 4.34 Mil/uL (ref 3.87–5.11)
RDW: 12.3 % (ref 11.5–15.5)
WBC: 6 10*3/uL (ref 4.0–10.5)

## 2021-12-05 LAB — TSH: TSH: 0.91 u[IU]/mL (ref 0.35–5.50)

## 2021-12-05 LAB — HEMOGLOBIN A1C: Hgb A1c MFr Bld: 5.9 % (ref 4.6–6.5)

## 2021-12-05 NOTE — Assessment & Plan Note (Signed)
Chronic Controlled, Stable Continue Viberzi 100 mg twice daily 

## 2021-12-05 NOTE — Assessment & Plan Note (Signed)
Chronic ?DEXA due-ordered ?Encouraged regular exercise ?Advised calcium and vitamin D daily ? ?

## 2021-12-05 NOTE — Assessment & Plan Note (Addendum)
Chronic ?Using cpap nightly-hoping to qualify for inspire ?

## 2021-12-05 NOTE — Assessment & Plan Note (Signed)
Chronic ?Continue simvastatin 20 mg daily ?Stressed smoking cessation ?Encouraged healthy diet, regular exercise ?

## 2021-12-05 NOTE — Assessment & Plan Note (Signed)
Chronic °Regular exercise and healthy diet encouraged °Check lipid panel  °Continue simvastatin 20 mg daily °

## 2021-12-05 NOTE — Assessment & Plan Note (Signed)
Chronic Controlled, Stable Continue Lexapro 10 mg daily 

## 2021-12-05 NOTE — Assessment & Plan Note (Signed)
Chronic ?Lab Results  ?Component Value Date  ? HGBA1C 6.1 06/03/2021  ? ?Has been well controlled ?Continue Ozempic 1 mg weekly ?Encouraged diabetic diet, regular exercise ?Check A1c, urine microalbumin ?

## 2021-12-05 NOTE — Assessment & Plan Note (Signed)
Chronic ?Smoking 5 or less cigarettes at night only ?No desire to quit at this time ?

## 2021-12-21 ENCOUNTER — Other Ambulatory Visit: Payer: Self-pay | Admitting: Internal Medicine

## 2021-12-24 ENCOUNTER — Other Ambulatory Visit: Payer: Self-pay | Admitting: Internal Medicine

## 2022-01-18 ENCOUNTER — Other Ambulatory Visit: Payer: Self-pay | Admitting: Internal Medicine

## 2022-01-28 DIAGNOSIS — G4733 Obstructive sleep apnea (adult) (pediatric): Secondary | ICD-10-CM | POA: Diagnosis not present

## 2022-02-13 ENCOUNTER — Other Ambulatory Visit: Payer: Self-pay | Admitting: Internal Medicine

## 2022-04-10 ENCOUNTER — Other Ambulatory Visit: Payer: Self-pay | Admitting: Internal Medicine

## 2022-04-11 ENCOUNTER — Other Ambulatory Visit: Payer: Self-pay | Admitting: Internal Medicine

## 2022-04-11 DIAGNOSIS — Z1231 Encounter for screening mammogram for malignant neoplasm of breast: Secondary | ICD-10-CM

## 2022-04-22 ENCOUNTER — Other Ambulatory Visit: Payer: Self-pay | Admitting: Internal Medicine

## 2022-04-27 IMAGING — DX DG CHEST 1V PORT
1 series · 1 of 1 positions shown · non-contrast
Comparison: Chest x-ray 07/27/2006, CT chest 08/11/2006

CLINICAL DATA: COVID positive, cough, congestion.

EXAM:
PORTABLE CHEST 1 VIEW

[chest ap]
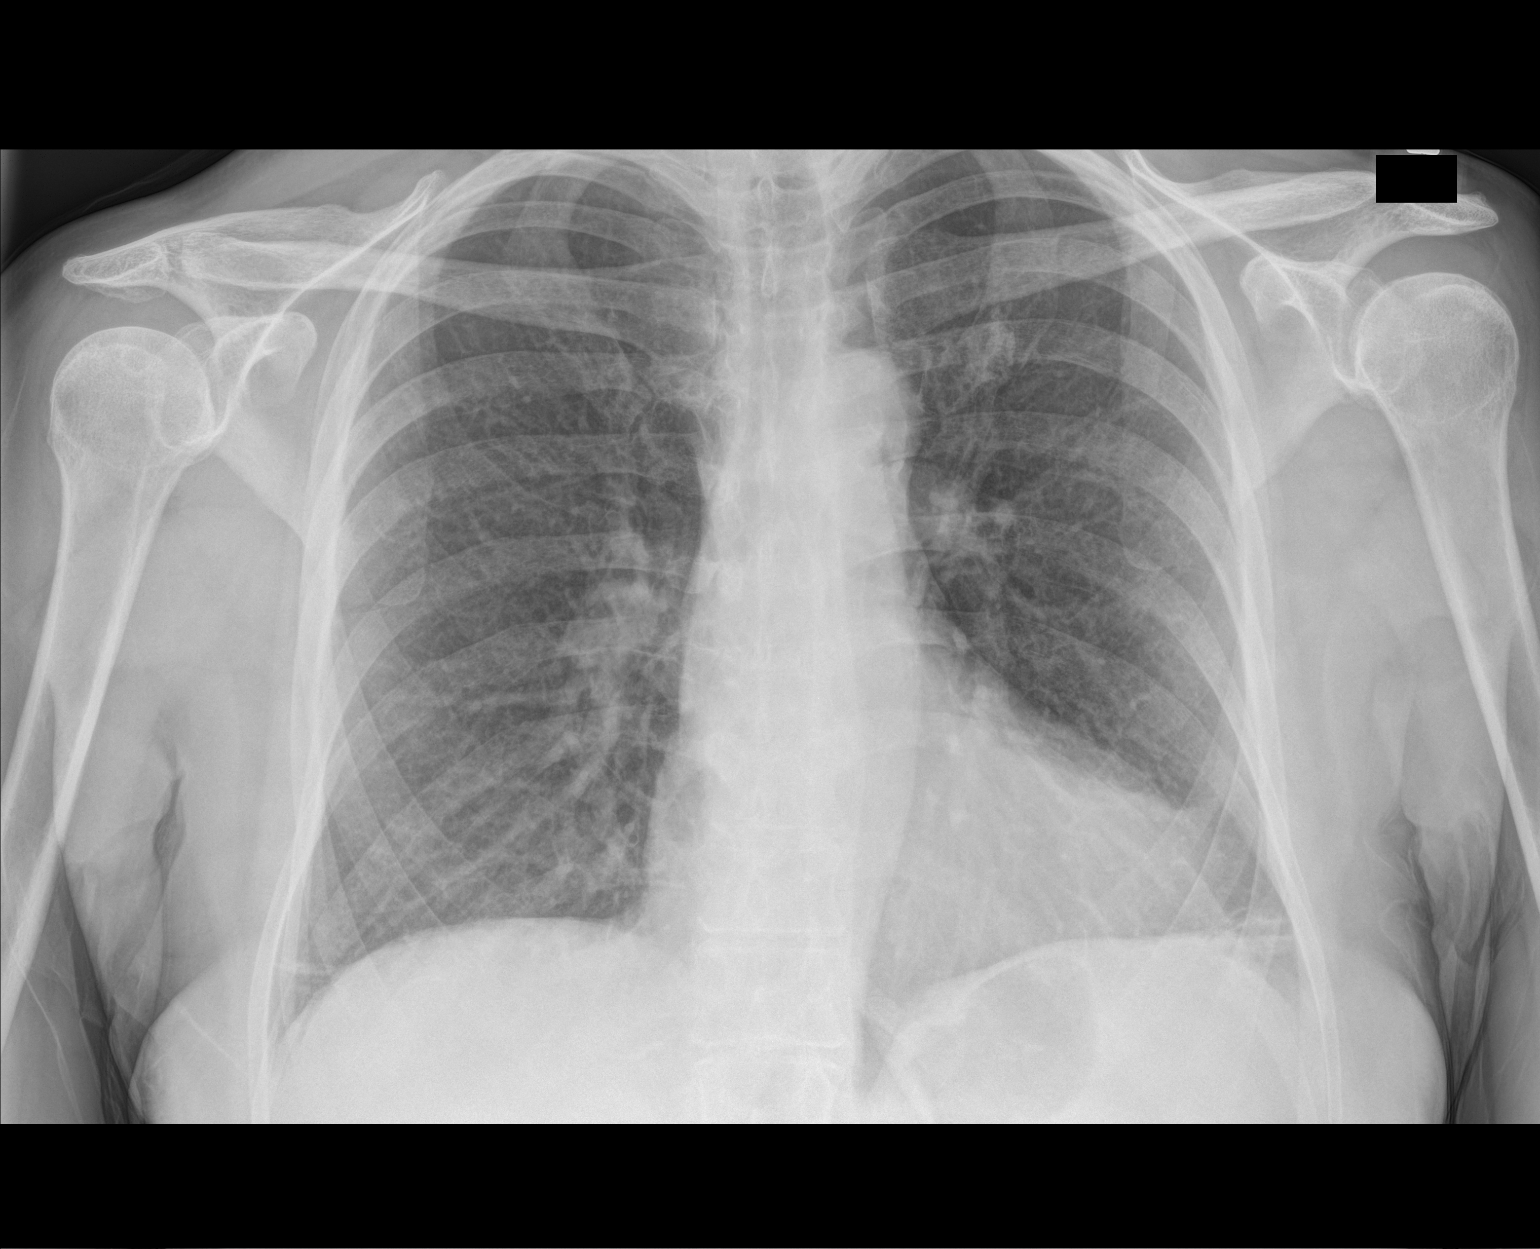

[1 of 1 positions shown; findings below may reference images not displayed]

FINDINGS: The heart size and mediastinal contours are within normal limits.

Bibasilar streaky opacities. No pulmonary edema. No pleural
effusion. No pneumothorax.

No acute osseous abnormality.
IMPRESSION: Bibasilar streaky opacities that could represent atelectasis versus
infection/inflammation.

## 2022-04-30 DIAGNOSIS — G4733 Obstructive sleep apnea (adult) (pediatric): Secondary | ICD-10-CM | POA: Diagnosis not present

## 2022-05-14 ENCOUNTER — Encounter: Payer: Self-pay | Admitting: Internal Medicine

## 2022-05-15 ENCOUNTER — Ambulatory Visit: Payer: Medicare HMO

## 2022-05-15 MED ORDER — VIBERZI 100 MG PO TABS: 1.0000 | ORAL_TABLET | Freq: Two times a day (BID) | ORAL | 2 refills | Status: DC

## 2022-05-15 MED ORDER — VIBERZI 100 MG PO TABS: 1.0000 | ORAL_TABLET | Freq: Two times a day (BID) | ORAL | 3 refills | Status: DC

## 2022-05-15 NOTE — Telephone Encounter (Signed)
Tried to send but I'm not able too.Marland KitchenJohny Buchanan

## 2022-05-15 NOTE — Telephone Encounter (Signed)
Pt states rx is out of stock at her pharmacy. She said she took her last dose of rx today and she said she can't be without it. She would like to know if there is an alternative that can be prescribed for her?   Please advise

## 2022-05-26 ENCOUNTER — Encounter: Payer: Self-pay | Admitting: Internal Medicine

## 2022-05-26 ENCOUNTER — Ambulatory Visit
Admission: RE | Admit: 2022-05-26 | Discharge: 2022-05-26 | Disposition: A | Discharge status: Home / Self Care | Payer: Medicare HMO | Source: Ambulatory Visit | Attending: Internal Medicine | Admitting: Internal Medicine

## 2022-05-26 DIAGNOSIS — Z1231 Encounter for screening mammogram for malignant neoplasm of breast: Secondary | ICD-10-CM

## 2022-05-27 ENCOUNTER — Encounter: Payer: Self-pay | Admitting: Internal Medicine

## 2022-05-27 ENCOUNTER — Other Ambulatory Visit: Payer: Self-pay

## 2022-05-27 MED ORDER — TRIAMCINOLONE ACETONIDE 0.025 % EX OINT: 1.0000 | TOPICAL_OINTMENT | Freq: Two times a day (BID) | CUTANEOUS | 1 refills | Status: AC

## 2022-05-28 ENCOUNTER — Other Ambulatory Visit: Payer: Self-pay | Admitting: Internal Medicine

## 2022-05-29 ENCOUNTER — Other Ambulatory Visit: Payer: Self-pay | Admitting: Internal Medicine

## 2022-05-29 DIAGNOSIS — N644 Mastodynia: Secondary | ICD-10-CM

## 2022-06-06 ENCOUNTER — Ambulatory Visit
Admission: RE | Admit: 2022-06-06 | Discharge: 2022-06-06 | Disposition: A | Discharge status: Home / Self Care | Payer: Medicare HMO | Source: Ambulatory Visit | Attending: Internal Medicine | Admitting: Internal Medicine

## 2022-06-06 DIAGNOSIS — N644 Mastodynia: Secondary | ICD-10-CM

## 2022-06-10 ENCOUNTER — Other Ambulatory Visit: Payer: Self-pay | Admitting: Internal Medicine

## 2022-06-11 ENCOUNTER — Encounter: Payer: Self-pay | Admitting: Internal Medicine

## 2022-06-11 NOTE — Patient Instructions (Addendum)
     Blood work was ordered.       Medications changes include :   zpak sent to your pharmacy.      Return in about 6 months (around 12/11/2022) for Physical Exam.

## 2022-06-11 NOTE — Progress Notes (Signed)
Subjective:    Patient ID: Christina Buchanan, female    DOB: 02/14/1956, 66 y.o.   MRN: [id number]     HPI Christina Buchanan is here for follow up of her chronic medical problems, including DM, IBS w/ diarrhea, depression, hld, OSA  She is taking her medications as prescribed.  A little exercise   Starting with sinus infection.  She has the nasal congestion and pressure.  She has a h/o sinus infections.  Medications and allergies reviewed with patient and updated if appropriate.  Current Outpatient Medications on File Prior to Visit  Medication Sig Dispense Refill   aspirin EC 81 MG tablet Take 1 tablet (81 mg total) by mouth daily.     Azelastine HCl 137 MCG/SPRAY SOLN PLACE 1 SPRAY IN EACH NOSTRIL DAILY AS DIRECTED 30 mL 8   Biotin w/ Vitamins C & E (HAIR SKIN & NAILS GUMMIES PO) Take by mouth.     cetirizine (ZYRTEC) 10 MG tablet Take 10 mg by mouth daily as needed for allergies.      COLLAGEN PO Take by mouth. Gummy once a day     Eluxadoline (VIBERZI) 100 MG TABS Take 1 tablet (100 mg total) by mouth 2 (two) times daily. 60 tablet 3   escitalopram (LEXAPRO) 10 MG tablet TAKE ONE TABLET BY MOUTH DAILY 90 tablet 1   hydrocortisone (ANUSOL-HC) 2.5 % rectal cream PLACE 1 APPLICATION RECTALLY 2 (TWO) TIMES DAILY. 30 g 2   nystatin cream (MYCOSTATIN) Apply 1 application topically 2 (two) times daily. 30 g 0   OZEMPIC, 1 MG/DOSE, 4 MG/3ML SOPN INJECT 1 MG UNDER THE SKIN ONCE WEEKLY 3 mL 2   simvastatin (ZOCOR) 20 MG tablet TAKE 1 TABLET BY MOUTH EVERY DAY AT 6PM 90 tablet 3   triamcinolone (KENALOG) 0.025 % ointment Apply 1 Application topically 2 (two) times daily. Use for one to two weeks at a time. 30 g 1   No current facility-administered medications on file prior to visit.     Review of Systems  Constitutional:  Negative for chills and fever.  HENT:  Positive for congestion and sinus pain. Negative for ear pain and sore throat.   Respiratory:  Negative for cough, shortness  of breath and wheezing.   Cardiovascular:  Negative for chest pain, palpitations and leg swelling.  Neurological:  Negative for light-headedness and headaches.       Objective:   Vitals:   06/12/22 1014  BP: 126/80  Pulse: 71  Temp: 98.1 F (36.7 C)  SpO2: 95%   BP Readings from Last 3 Encounters:  06/12/22 126/80  12/05/21 118/80  06/03/21 134/70   Wt Readings from Last 3 Encounters:  06/12/22 149 lb (67.6 kg)  12/05/21 149 lb 3.2 oz (67.7 kg)  06/03/21 149 lb (67.6 kg)   Body mass index is 26.39 kg/m.    Physical Exam Constitutional:      General: She is not in acute distress.    Appearance: Normal appearance.  HENT:     Head: Normocephalic and atraumatic.  Eyes:     Conjunctiva/sclera: Conjunctivae normal.  Cardiovascular:     Rate and Rhythm: Normal rate and regular rhythm.     Heart sounds: Normal heart sounds. No murmur heard. Pulmonary:     Effort: Pulmonary effort is normal. No respiratory distress.     Breath sounds: Normal breath sounds. No wheezing.  Musculoskeletal:     Cervical back: Neck supple.     Right lower  leg: No edema.     Left lower leg: No edema.  Lymphadenopathy:     Cervical: No cervical adenopathy.  Skin:    General: Skin is warm and dry.     Findings: No rash.  Neurological:     Mental Status: She is alert. Mental status is at baseline.  Psychiatric:        Mood and Affect: Mood normal.        Behavior: Behavior normal.        Lab Results  Component Value Date   WBC 6.0 12/05/2021   HGB 13.9 12/05/2021   HCT 41.4 12/05/2021   PLT 244.0 12/05/2021   GLUCOSE 99 12/05/2021   CHOL 140 12/05/2021   TRIG 80.0 12/05/2021   HDL 46.30 12/05/2021   LDLDIRECT 153.6 12/12/2011   LDLCALC 78 12/05/2021   ALT 23 12/05/2021   AST 23 12/05/2021   NA 140 12/05/2021   K 4.0 12/05/2021   CL 104 12/05/2021   CREATININE 0.71 12/05/2021   BUN 14 12/05/2021   CO2 29 12/05/2021   TSH 0.91 12/05/2021   HGBA1C 5.9 12/05/2021    MICROALBUR 0.7 12/05/2021     Assessment & Plan:    See Problem List for Assessment and Plan of chronic medical problems.

## 2022-06-12 ENCOUNTER — Ambulatory Visit (INDEPENDENT_AMBULATORY_CARE_PROVIDER_SITE_OTHER): Payer: Medicare HMO | Admitting: Internal Medicine

## 2022-06-12 VITALS — BP 126/80 | HR 71 | Temp 98.1°F | Ht 63.0 in | Wt 149.0 lb

## 2022-06-12 DIAGNOSIS — K580 Irritable bowel syndrome with diarrhea: Secondary | ICD-10-CM

## 2022-06-12 DIAGNOSIS — E782 Mixed hyperlipidemia: Secondary | ICD-10-CM

## 2022-06-12 DIAGNOSIS — J0100 Acute maxillary sinusitis, unspecified: Secondary | ICD-10-CM

## 2022-06-12 DIAGNOSIS — E119 Type 2 diabetes mellitus without complications: Secondary | ICD-10-CM

## 2022-06-12 DIAGNOSIS — F3289 Other specified depressive episodes: Secondary | ICD-10-CM | POA: Diagnosis not present

## 2022-06-12 DIAGNOSIS — G4733 Obstructive sleep apnea (adult) (pediatric): Secondary | ICD-10-CM | POA: Diagnosis not present

## 2022-06-12 DIAGNOSIS — R69 Illness, unspecified: Secondary | ICD-10-CM | POA: Diagnosis not present

## 2022-06-12 LAB — COMPREHENSIVE METABOLIC PANEL
ALT: 24 U/L (ref 0–35)
AST: 22 U/L (ref 0–37)
Albumin: 4.2 g/dL (ref 3.5–5.2)
Alkaline Phosphatase: 51 U/L (ref 39–117)
BUN: 16 mg/dL (ref 6–23)
CO2: 28 mEq/L (ref 19–32)
Calcium: 9.3 mg/dL (ref 8.4–10.5)
Chloride: 105 mEq/L (ref 96–112)
Creatinine, Ser: 0.62 mg/dL (ref 0.40–1.20)
GFR: 92.18 mL/min (ref >60.00)
Glucose, Bld: 97 mg/dL (ref 70–99)
Potassium: 4.3 mEq/L (ref 3.5–5.1)
Sodium: 140 mEq/L (ref 135–145)
Total Bilirubin: 0.5 mg/dL (ref 0.2–1.2)
Total Protein: 7.2 g/dL (ref 6.0–8.3)

## 2022-06-12 LAB — LIPID PANEL
Cholesterol: 128 mg/dL (ref 0–200)
HDL: 41.5 mg/dL (ref >39.00)
LDL Cholesterol: 73 mg/dL (ref 0–99)
NonHDL: 86.65
Total CHOL/HDL Ratio: 3
Triglycerides: 66 mg/dL (ref 0.0–149.0)
VLDL: 13.2 mg/dL (ref 0.0–40.0)

## 2022-06-12 LAB — HEMOGLOBIN A1C: Hgb A1c MFr Bld: 6 % (ref 4.6–6.5)

## 2022-06-12 MED ORDER — AZITHROMYCIN 250 MG PO TABS: ORAL_TABLET | ORAL | 0 refills | Status: DC

## 2022-06-12 NOTE — Assessment & Plan Note (Signed)
Chronic  Lab Results  Component Value Date   HGBA1C 5.9 12/05/2021   Sugars well controlled Check A1c Continue Ozempic 1 mg weekly Stressed regular exercise, diabetic diet

## 2022-06-12 NOTE — Assessment & Plan Note (Signed)
Chronic °Regular exercise and healthy diet encouraged °Check lipid panel  °Continue simvastatin 20 mg daily °

## 2022-06-12 NOTE — Assessment & Plan Note (Signed)
Acute Likely bacterial  Start Zpak otc cold medications Rest, fluid Call if no improvement  

## 2022-06-12 NOTE — Assessment & Plan Note (Signed)
Chronic Using CPAP nightly 

## 2022-06-12 NOTE — Assessment & Plan Note (Signed)
Chronic Controlled, Stable Continue Lexapro 10 mg daily 

## 2022-06-12 NOTE — Assessment & Plan Note (Signed)
Chronic Controlled, Stable Continue Viberzi 100 mg twice daily

## 2022-07-11 ENCOUNTER — Encounter: Payer: Self-pay | Admitting: Genetic Counselor

## 2022-07-20 IMAGING — CT CT CARDIAC CORONARY ARTERY CALCIUM SCORE
3 series · 14 of 20 positions shown, 15 images · non-contrast
Comparison: None.
COMPARISON: None.

Addendum:
EXAM:
OVER-READ INTERPRETATION  CT CHEST

The following report is an over-read performed by radiologist Dr.
Danii Aujla [REDACTED] on 12/14/2020. This
over-read does not include interpretation of cardiac or coronary
anatomy or pathology. The coronary calcium score interpretation by
the cardiologist is attached.
CLINICAL DATA: Risk stratification
Coronary Calcium Score
TECHNIQUE: The patient was scanned on a Siemens Force scanner. Axial
non-contrast 3 mm slices were carried out through the heart. The
data set was analyzed on a dedicated work station and scored using
the Agatson method.

[Series 3: casc 3.0 bv41 2 bestdiast 70 % · axial · 0.39mm/px · z∈[-319,-241]mm · 4 of 44 slices shown, 5 images]
[im 9/44  vessel]
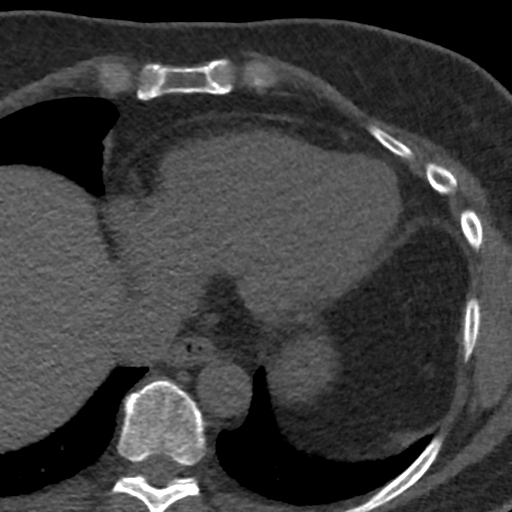
[im 9/44  lung]
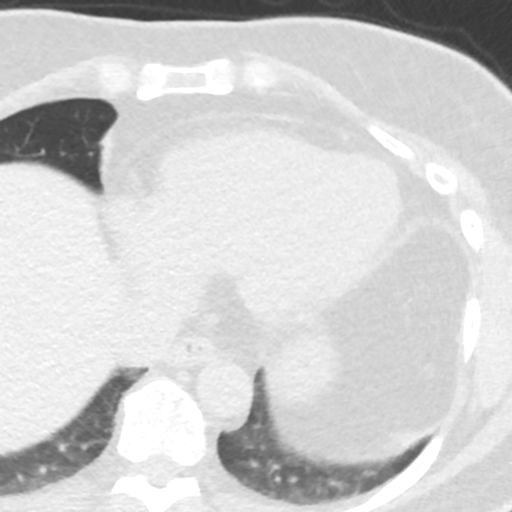
[im 18/44  vessel]
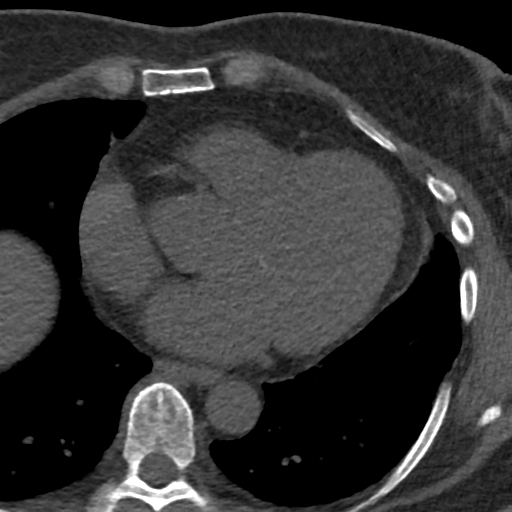
[im 26/44  vessel]
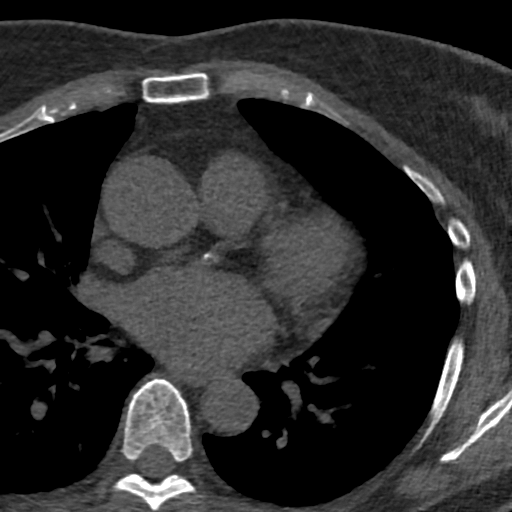
[im 35/44  vessel]
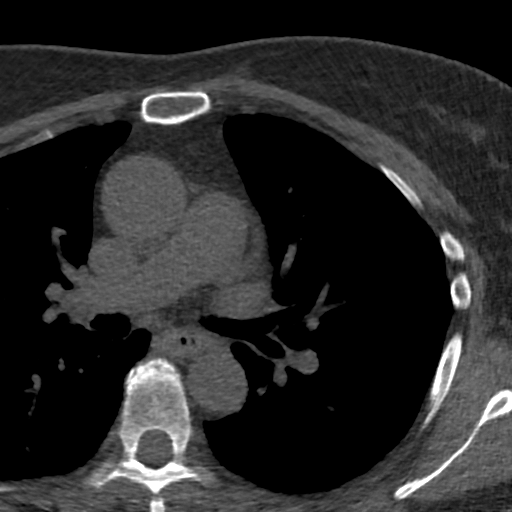

[Series 4: lung 70 % · axial · 0.68mm/px · z∈[-322,-238]mm · 5 of 44 slices shown]
[im 8/44  lung]
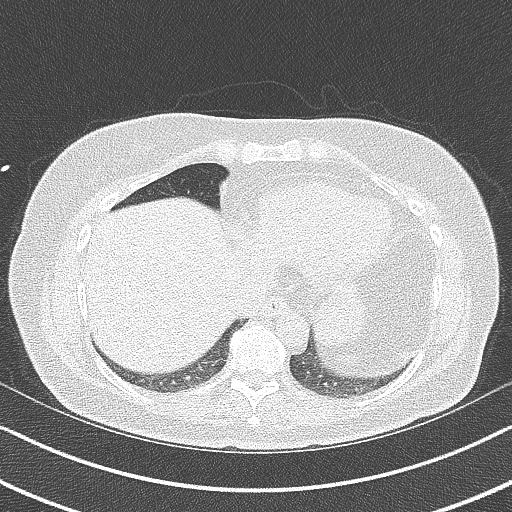
[im 15/44  lung]
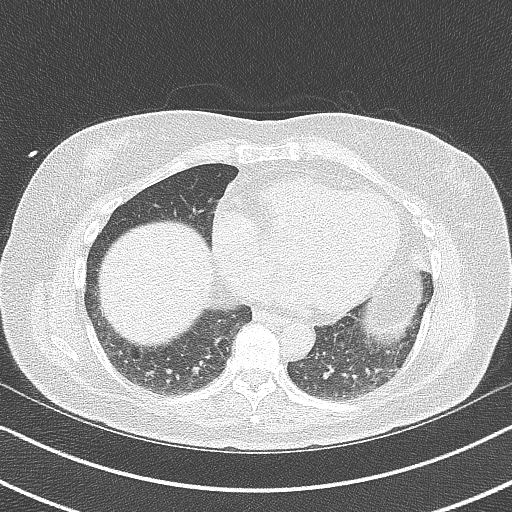
[im 22/44  lung]
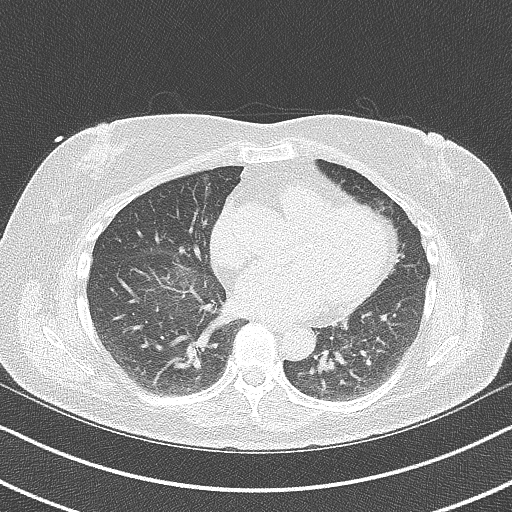
[im 29/44  lung]
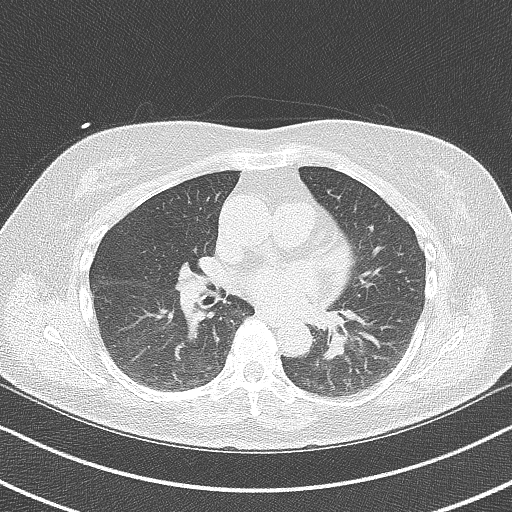
[im 36/44  lung]
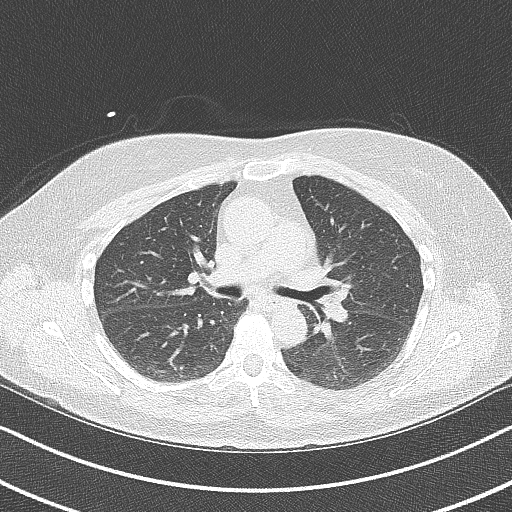

[Series 5: lung st 70 % · axial · 0.68mm/px · z∈[-322,-238]mm · 5 of 44 slices shown]
[im 8/44  lung]
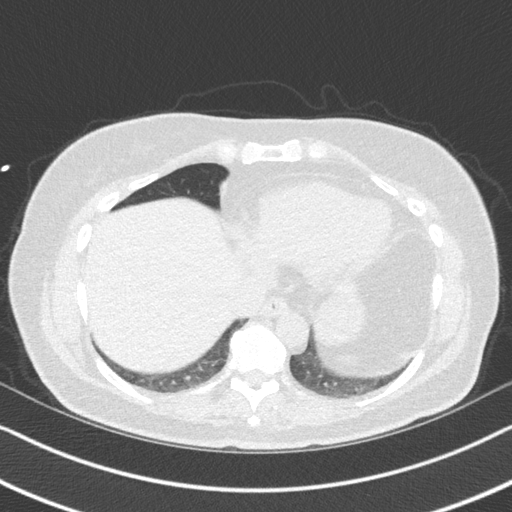
[im 15/44  lung]
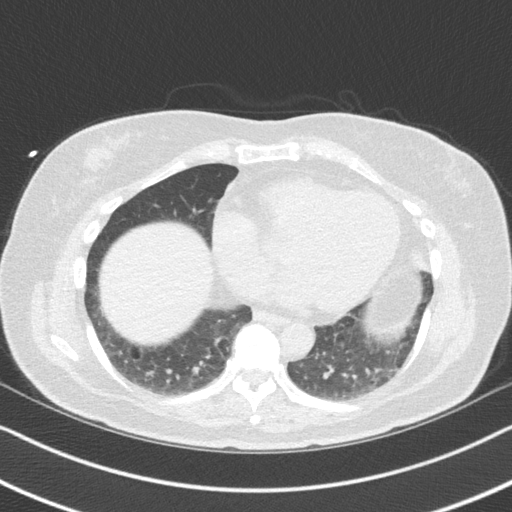
[im 22/44  lung]
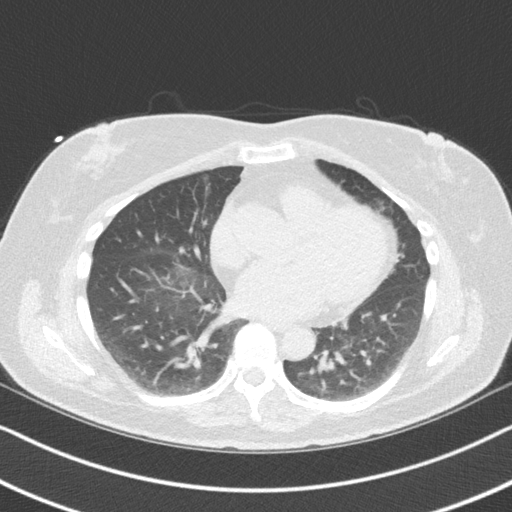
[im 29/44  lung]
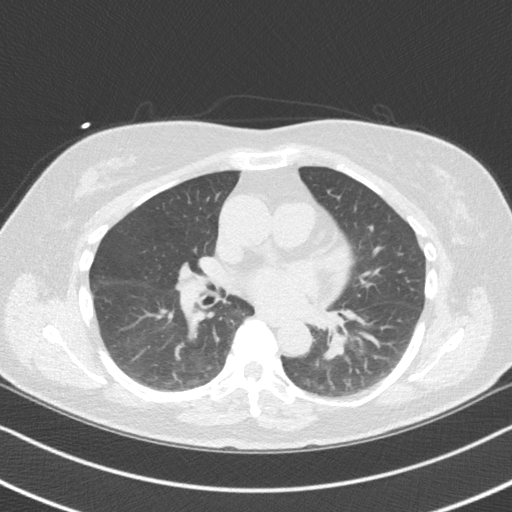
[im 36/44  lung]
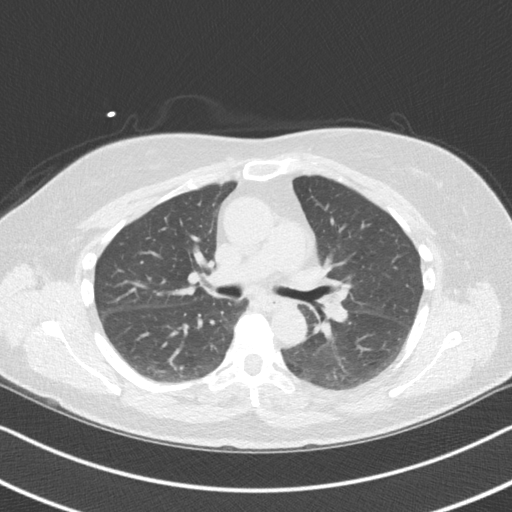

[14 of 20 positions shown; findings below may reference images not displayed]

FINDINGS: Within the visualized portions of the thorax there are no suspicious
appearing pulmonary nodules or masses, there is no acute
consolidative airspace disease, no pleural effusions, no
pneumothorax and no lymphadenopathy. Visualized portions of the
upper abdomen are unremarkable. There are no aggressive appearing
lytic or blastic lesions noted in the visualized portions of the
skeleton.
IMPRESSION: 1. No significant incidental noncardiac findings are noted.
FINDINGS: Non-cardiac: See separate report from [REDACTED].

Ascending Aorta: Normal caliber

Pericardium: Normal

Coronary arteries: Normal origins. Calcium in the proximal LAD and
RCA
IMPRESSION: Coronary calcium score of 25.7. This was 67th percentile for age and
sex matched control.

*** End of Addendum ***
EXAM:
OVER-READ INTERPRETATION  CT CHEST

The following report is an over-read performed by radiologist Dr.
Danii Aujla [REDACTED] on 12/14/2020. This
over-read does not include interpretation of cardiac or coronary
anatomy or pathology. The coronary calcium score interpretation by
the cardiologist is attached.
FINDINGS: Within the visualized portions of the thorax there are no suspicious
appearing pulmonary nodules or masses, there is no acute
consolidative airspace disease, no pleural effusions, no
pneumothorax and no lymphadenopathy. Visualized portions of the
upper abdomen are unremarkable. There are no aggressive appearing
lytic or blastic lesions noted in the visualized portions of the
skeleton.
IMPRESSION: 1. No significant incidental noncardiac findings are noted.

## 2022-07-22 ENCOUNTER — Other Ambulatory Visit: Payer: Self-pay | Admitting: Internal Medicine

## 2022-08-01 DIAGNOSIS — G4733 Obstructive sleep apnea (adult) (pediatric): Secondary | ICD-10-CM | POA: Diagnosis not present

## 2022-08-19 ENCOUNTER — Other Ambulatory Visit: Payer: Self-pay | Admitting: Internal Medicine

## 2022-09-23 ENCOUNTER — Ambulatory Visit
Admission: RE | Admit: 2022-09-23 | Discharge: 2022-09-23 | Disposition: A | Discharge status: Home / Self Care | Payer: Medicare HMO | Source: Ambulatory Visit | Attending: Internal Medicine | Admitting: Internal Medicine

## 2022-09-23 DIAGNOSIS — M8589 Other specified disorders of bone density and structure, multiple sites: Secondary | ICD-10-CM | POA: Diagnosis not present

## 2022-09-23 DIAGNOSIS — M85851 Other specified disorders of bone density and structure, right thigh: Secondary | ICD-10-CM

## 2022-09-23 DIAGNOSIS — Z780 Asymptomatic menopausal state: Secondary | ICD-10-CM | POA: Diagnosis not present

## 2022-09-24 DIAGNOSIS — L821 Other seborrheic keratosis: Secondary | ICD-10-CM | POA: Diagnosis not present

## 2022-09-24 DIAGNOSIS — L814 Other melanin hyperpigmentation: Secondary | ICD-10-CM | POA: Diagnosis not present

## 2022-09-24 DIAGNOSIS — Z85828 Personal history of other malignant neoplasm of skin: Secondary | ICD-10-CM | POA: Diagnosis not present

## 2022-09-24 DIAGNOSIS — D225 Melanocytic nevi of trunk: Secondary | ICD-10-CM | POA: Diagnosis not present

## 2022-09-24 DIAGNOSIS — D2261 Melanocytic nevi of right upper limb, including shoulder: Secondary | ICD-10-CM | POA: Diagnosis not present

## 2022-09-24 DIAGNOSIS — D1801 Hemangioma of skin and subcutaneous tissue: Secondary | ICD-10-CM | POA: Diagnosis not present

## 2022-09-24 DIAGNOSIS — D2262 Melanocytic nevi of left upper limb, including shoulder: Secondary | ICD-10-CM | POA: Diagnosis not present

## 2022-09-25 ENCOUNTER — Encounter: Payer: Self-pay | Admitting: Internal Medicine

## 2022-09-28 DIAGNOSIS — U071 COVID-19: Secondary | ICD-10-CM | POA: Diagnosis not present

## 2022-09-28 DIAGNOSIS — Z20822 Contact with and (suspected) exposure to covid-19: Secondary | ICD-10-CM | POA: Diagnosis not present

## 2022-09-28 DIAGNOSIS — J069 Acute upper respiratory infection, unspecified: Secondary | ICD-10-CM | POA: Diagnosis not present

## 2022-09-28 DIAGNOSIS — R059 Cough, unspecified: Secondary | ICD-10-CM | POA: Diagnosis not present

## 2022-09-30 ENCOUNTER — Telehealth: Payer: Medicare HMO | Admitting: Physician Assistant

## 2022-09-30 DIAGNOSIS — B9689 Other specified bacterial agents as the cause of diseases classified elsewhere: Secondary | ICD-10-CM

## 2022-09-30 DIAGNOSIS — J019 Acute sinusitis, unspecified: Secondary | ICD-10-CM | POA: Diagnosis not present

## 2022-09-30 MED ORDER — AZITHROMYCIN 250 MG PO TABS
ORAL_TABLET | ORAL | 0 refills | Status: AC
Start: 2022-09-30 — End: 2022-10-05

## 2022-09-30 NOTE — Progress Notes (Signed)
Message sent to patient requesting further input regarding current symptoms. Awaiting patient response.  

## 2022-09-30 NOTE — Progress Notes (Signed)
I have spent 5 minutes in review of e-visit questionnaire, review and updating patient chart, medical decision making and response to patient.   Shamarie Call Cody Gwin Eagon, PA-C    

## 2022-09-30 NOTE — Progress Notes (Signed)
E-Visit for Sinus Problems  We are sorry that you are not feeling well.  Here is how we plan to help!  Based on what you have shared with me it looks like you have sinusitis.  Sinusitis is inflammation and infection in the sinus cavities of the head.  Based on your presentation I believe you most likely have Acute Bacterial Sinusitis.  This is an infection caused by bacteria and is treated with antibiotics. I have prescribed Azithromycin to take as directed. You may use an oral decongestant such as Mucinex D or if you have glaucoma or high blood pressure use plain Mucinex. Saline nasal spray help and can safely be used as often as needed for congestion.  If you develop worsening sinus pain, fever or notice severe headache and vision changes, or if symptoms are not better after completion of antibiotic, please schedule an appointment with a health care provider.    Sinus infections are not as easily transmitted as other respiratory infection, however we still recommend that you avoid close contact with loved ones, especially the very young and elderly.  Remember to wash your hands thoroughly throughout the day as this is the number one way to prevent the spread of infection!  Home Care: Only take medications as instructed by your medical team. Complete the entire course of an antibiotic. Do not take these medications with alcohol. A steam or ultrasonic humidifier can help congestion.  You can place a towel over your head and breathe in the steam from hot water coming from a faucet. Avoid close contacts especially the very young and the elderly. Cover your mouth when you cough or sneeze. Always remember to wash your hands.  Get Help Right Away If: You develop worsening fever or sinus pain. You develop a severe head ache or visual changes. Your symptoms persist after you have completed your treatment plan.  Make sure you Understand these instructions. Will watch your condition. Will get help  right away if you are not doing well or get worse.  Thank you for choosing an e-visit.  Your e-visit answers were reviewed by a board certified advanced clinical practitioner to complete your personal care plan. Depending upon the condition, your plan could have included both over the counter or prescription medications.  Please review your pharmacy choice. Make sure the pharmacy is open so you can pick up prescription now. If there is a problem, you may contact your provider through [employer] and have the prescription routed to another pharmacy.  Your safety is important to [place]. If you have drug allergies check your prescription carefully.   For the next 24 hours you can use MyChart to ask questions about today's visit, request a non-urgent call back, or ask for a work or school excuse. You will get an email in the next two days asking about your experience. I hope that your e-visit has been valuable and will speed your recovery.

## 2022-10-15 ENCOUNTER — Other Ambulatory Visit: Payer: Self-pay | Admitting: Internal Medicine

## 2022-10-15 ENCOUNTER — Other Ambulatory Visit: Payer: Self-pay

## 2022-12-04 DIAGNOSIS — G4733 Obstructive sleep apnea (adult) (pediatric): Secondary | ICD-10-CM | POA: Diagnosis not present

## 2022-12-06 ENCOUNTER — Other Ambulatory Visit: Payer: Self-pay | Admitting: Internal Medicine

## 2022-12-12 ENCOUNTER — Encounter: Payer: Medicare HMO | Admitting: Internal Medicine

## 2022-12-16 ENCOUNTER — Encounter: Payer: Self-pay | Admitting: Internal Medicine

## 2022-12-16 NOTE — Patient Instructions (Addendum)
Call and schedule your colonoscopy.    Blood work was ordered.   The lab is on the first floor.    Medications changes include :   None     Return in about 6 months (around 06/19/2023) for follow up.   Health Maintenance, Female Adopting a healthy lifestyle and getting preventive care are important in promoting health and wellness. Ask your health care provider about: The right schedule for you to have regular tests and exams. Things you can do on your own to prevent diseases and keep yourself healthy. What should I know about diet, weight, and exercise? Eat a healthy diet  Eat a diet that includes plenty of vegetables, fruits, low-fat dairy products, and lean protein. Do not eat a lot of foods that are high in solid fats, added sugars, or sodium. Maintain a healthy weight Body mass index (BMI) is used to identify weight problems. It estimates body fat based on height and weight. Your health care provider can help determine your BMI and help you achieve or maintain a healthy weight. Get regular exercise Get regular exercise. This is one of the most important things you can do for your health. Most adults should: Exercise for at least 150 minutes each week. The exercise should increase your heart rate and make you sweat (moderate-intensity exercise). Do strengthening exercises at least twice a week. This is in addition to the moderate-intensity exercise. Spend less time sitting. Even light physical activity can be beneficial. Watch cholesterol and blood lipids Have your blood tested for lipids and cholesterol at 68 years of age, then have this test every 5 years. Have your cholesterol levels checked more often if: Your lipid or cholesterol levels are high. You are older than 68 years of age. You are at high risk for heart disease. What should I know about cancer screening? Depending on your health history and family history, you may need to have cancer screening at various  ages. This may include screening for: Breast cancer. Cervical cancer. Colorectal cancer. Skin cancer. Lung cancer. What should I know about heart disease, diabetes, and high blood pressure? Blood pressure and heart disease High blood pressure causes heart disease and increases the risk of stroke. This is more likely to develop in people who have high blood pressure readings or are overweight. Have your blood pressure checked: Every 3-5 years if you are 49-79 years of age. Every year if you are 102 years old or older. Diabetes Have regular diabetes screenings. This checks your fasting blood sugar level. Have the screening done: Once every three years after age 62 if you are at a normal weight and have a low risk for diabetes. More often and at a younger age if you are overweight or have a high risk for diabetes. What should I know about preventing infection? Hepatitis B If you have a higher risk for hepatitis B, you should be screened for this virus. Talk with your health care provider to find out if you are at risk for hepatitis B infection. Hepatitis C Testing is recommended for: Everyone born from [birth year] through 1966. Anyone with known risk factors for hepatitis C. Sexually transmitted infections (STIs) Get screened for STIs, including gonorrhea and chlamydia, if: You are sexually active and are younger than 68 years of age. You are older than 68 years of age and your health care provider tells you that you are at risk for this type of infection. Your sexual activity has changed since you were  last screened, and you are at increased risk for chlamydia or gonorrhea. Ask your health care provider if you are at risk. Ask your health care provider about whether you are at high risk for HIV. Your health care provider may recommend a prescription medicine to help prevent HIV infection. If you choose to take medicine to prevent HIV, you should first get tested for HIV. You should then be tested  every 3 months for as long as you are taking the medicine. Pregnancy If you are about to stop having your period (premenopausal) and you may become pregnant, seek counseling before you get pregnant. Take 400 to 800 micrograms (mcg) of folic acid every day if you become pregnant. Ask for birth control (contraception) if you want to prevent pregnancy. Osteoporosis and menopause Osteoporosis is a disease in which the bones lose minerals and strength with aging. This can result in bone fractures. If you are 52 years old or older, or if you are at risk for osteoporosis and fractures, ask your health care provider if you should: Be screened for bone loss. Take a calcium or vitamin D supplement to lower your risk of fractures. Be given hormone replacement therapy (HRT) to treat symptoms of menopause. Follow these instructions at home: Alcohol use Do not drink alcohol if: Your health care provider tells you not to drink. You are pregnant, may be pregnant, or are planning to become pregnant. If you drink alcohol: Limit how much you have to: 0-1 drink a day. Know how much alcohol is in your drink. In the [place] one drink equals one 12 oz bottle of beer (355 mL), one 5 oz glass of wine (148 mL), or one 1 oz glass of hard liquor (44 mL). Lifestyle Do not use any products that contain nicotine or tobacco. These products include cigarettes, chewing tobacco, and vaping devices, such as e-cigarettes. If you need help quitting, ask your health care provider. Do not use street drugs. Do not share needles. Ask your health care provider for help if you need support or information about quitting drugs. General instructions Schedule regular health, dental, and eye exams. Stay current with your vaccines. Tell your health care provider if: You often feel depressed. You have ever been abused or do not feel safe at home. Summary Adopting a healthy lifestyle and getting preventive care are important in promoting  health and wellness. Follow your health care provider's instructions about healthy diet, exercising, and getting tested or screened for diseases. Follow your health care provider's instructions on monitoring your cholesterol and blood pressure. This information is not intended to replace advice given to you by your health care provider. Make sure you discuss any questions you have with your health care provider. Document Revised: 02/11/2021 Document Reviewed: 02/11/2021 Elsevier Patient Education  [place].

## 2022-12-16 NOTE — Progress Notes (Unsigned)
Subjective:    Patient ID: Christina Buchanan, female    DOB: 14-Mar-1956, 67 y.o.   MRN: [id number]      HPI Christina Buchanan is here for a Physical exam and her chronic medical problems.    Doing well. No concerns.   Medications and allergies reviewed with patient and updated if appropriate.  Current Outpatient Medications on File Prior to Visit  Medication Sig Dispense Refill   aspirin EC 81 MG tablet Take 1 tablet (81 mg total) by mouth daily.     Azelastine HCl 137 MCG/SPRAY SOLN PLACE 1 SPRAY IN EACH NOSTRIL DAILY AS DIRECTED 30 mL 8   Biotin w/ Vitamins C & E (HAIR SKIN & NAILS GUMMIES PO) Take by mouth.     cetirizine (ZYRTEC) 10 MG tablet Take 10 mg by mouth daily as needed for allergies.      COLLAGEN PO Take by mouth. Gummy once a day     Eluxadoline (VIBERZI) 100 MG TABS TAKE ONE TABLET BY MOUTH TWICE A DAY 60 tablet 3   escitalopram (LEXAPRO) 10 MG tablet TAKE 1 TABLET BY MOUTH DAILY 90 tablet 1   hydrocortisone (ANUSOL-HC) 2.5 % rectal cream PLACE 1 APPLICATION RECTALLY 2 (TWO) TIMES DAILY. 30 g 2   nystatin cream (MYCOSTATIN) Apply 1 application topically 2 (two) times daily. 30 g 0   OZEMPIC, 1 MG/DOSE, 4 MG/3ML SOPN DIAL AND INJECT UNDER THE SKIN 1 MG WEEKLY 3 mL 2   simvastatin (ZOCOR) 20 MG tablet TAKE 1 TABLET BY MOUTH EVERY DAY AT 6PM 90 tablet 3   triamcinolone (KENALOG) 0.025 % ointment Apply 1 Application topically 2 (two) times daily. Use for one to two weeks at a time. 30 g 1   No current facility-administered medications on file prior to visit.    Review of Systems  Constitutional:  Negative for fever.  Eyes:  Negative for visual disturbance.  Respiratory:  Negative for cough, shortness of breath and wheezing.   Cardiovascular:  Negative for chest pain, palpitations and leg swelling.  Gastrointestinal:  Negative for abdominal pain, blood in stool, constipation and diarrhea.       No gerd  Genitourinary:  Negative for dysuria.  Musculoskeletal:  Negative  for arthralgias and back pain.  Skin:  Negative for rash.  Neurological:  Negative for light-headedness and headaches.  Psychiatric/Behavioral:  Negative for dysphoric mood. The patient is not nervous/anxious.        Objective:   Vitals:   12/17/22 0920  BP: 122/74  Pulse: 78  Temp: 98.2 F (36.8 C)  SpO2: 94%   Filed Weights   12/17/22 0920  Weight: 152 lb 12.8 oz (69.3 kg)   Body mass index is 27.07 kg/m.  BP Readings from Last 3 Encounters:  12/17/22 122/74  06/12/22 126/80  12/05/21 118/80    Wt Readings from Last 3 Encounters:  12/17/22 152 lb 12.8 oz (69.3 kg)  06/12/22 149 lb (67.6 kg)  12/05/21 149 lb 3.2 oz (67.7 kg)       Physical Exam Constitutional: She appears well-developed and well-nourished. No distress.  HENT:  Head: Normocephalic and atraumatic.  Right Ear: External ear normal. Normal ear canal and TM Left Ear: External ear normal.  Normal ear canal and TM Mouth/Throat: Oropharynx is clear and moist.  Eyes: Conjunctivae normal.  Neck: Neck supple. No tracheal deviation present. No thyromegaly present.  No carotid bruit  Cardiovascular: Normal rate, regular rhythm and normal heart sounds.   No murmur heard.  No edema. Pulmonary/Chest: Effort normal and breath sounds normal. No respiratory distress. She has no wheezes. She has no rales.  Breast: deferred   Abdominal: Soft. She exhibits no distension. There is no tenderness.  Lymphadenopathy: She has no cervical adenopathy.  Skin: Skin is warm and dry. She is not diaphoretic.  Psychiatric: She has a normal mood and affect. Her behavior is normal.     Lab Results  Component Value Date   WBC 6.0 12/05/2021   HGB 13.9 12/05/2021   HCT 41.4 12/05/2021   PLT 244.0 12/05/2021   GLUCOSE 97 06/12/2022   CHOL 128 06/12/2022   TRIG 66.0 06/12/2022   HDL 41.50 06/12/2022   LDLDIRECT 153.6 12/12/2011   LDLCALC 73 06/12/2022   ALT 24 06/12/2022   AST 22 06/12/2022   NA 140 06/12/2022   K 4.3  06/12/2022   CL 105 06/12/2022   CREATININE 0.62 06/12/2022   BUN 16 06/12/2022   CO2 28 06/12/2022   TSH 0.91 12/05/2021   HGBA1C 6.0 06/12/2022   MICROALBUR 0.7 12/05/2021         Assessment & Plan:   Physical exam: Screening blood work  ordered Exercise  none - encouraged exercise Weight  ok  Substance abuse  none   Reviewed recommended immunizations.   Health Maintenance  Topic Date Due   Medicare Annual Wellness (AWV)  Never done   COLONOSCOPY (Pts 45-55yr Insurance coverage will need to be confirmed)  09/23/2020   DTaP/Tdap/Td (3 - Td or Tdap) 02/16/2022   OPHTHALMOLOGY EXAM  09/05/2022   Diabetic kidney evaluation - Urine ACR  12/06/2022   FOOT EXAM  12/06/2022   HEMOGLOBIN A1C  12/11/2022   INFLUENZA VACCINE  01/04/2023 (Originally 05/06/2022)   MAMMOGRAM  06/07/2023   Diabetic kidney evaluation - eGFR measurement  06/13/2023   DEXA SCAN  09/23/2025   Hepatitis C Screening  Completed   HPV VACCINES  Aged Out   Pneumonia Vaccine 67 Years old  Discontinued   COVID-19 Vaccine  Discontinued   Zoster Vaccines- Shingrix  Discontinued      Due for colonoscopy-she knows she needs to have this and will schedule it    See Problem List for Assessment and Plan of chronic medical problems.

## 2022-12-17 ENCOUNTER — Ambulatory Visit (INDEPENDENT_AMBULATORY_CARE_PROVIDER_SITE_OTHER): Payer: Medicare HMO | Admitting: Internal Medicine

## 2022-12-17 VITALS — BP 122/74 | HR 78 | Temp 98.2°F | Ht 63.0 in | Wt 152.8 lb

## 2022-12-17 DIAGNOSIS — Z0000 Encounter for general adult medical examination without abnormal findings: Secondary | ICD-10-CM | POA: Diagnosis not present

## 2022-12-17 DIAGNOSIS — I700 Atherosclerosis of aorta: Secondary | ICD-10-CM | POA: Diagnosis not present

## 2022-12-17 DIAGNOSIS — E119 Type 2 diabetes mellitus without complications: Secondary | ICD-10-CM | POA: Diagnosis not present

## 2022-12-17 DIAGNOSIS — K580 Irritable bowel syndrome with diarrhea: Secondary | ICD-10-CM | POA: Diagnosis not present

## 2022-12-17 DIAGNOSIS — R69 Illness, unspecified: Secondary | ICD-10-CM | POA: Diagnosis not present

## 2022-12-17 DIAGNOSIS — F17210 Nicotine dependence, cigarettes, uncomplicated: Secondary | ICD-10-CM

## 2022-12-17 DIAGNOSIS — E782 Mixed hyperlipidemia: Secondary | ICD-10-CM

## 2022-12-17 DIAGNOSIS — F3289 Other specified depressive episodes: Secondary | ICD-10-CM

## 2022-12-17 LAB — CBC WITH DIFFERENTIAL/PLATELET
Basophils Absolute: 0 10*3/uL (ref 0.0–0.1)
Basophils Relative: 0.4 % (ref 0.0–3.0)
Eosinophils Absolute: 0.1 10*3/uL (ref 0.0–0.7)
Eosinophils Relative: 1.5 % (ref 0.0–5.0)
HCT: 42.1 % (ref 36.0–46.0)
Hemoglobin: 14.4 g/dL (ref 12.0–15.0)
Lymphocytes Relative: 27.6 % (ref 12.0–46.0)
Lymphs Abs: 1.7 10*3/uL (ref 0.7–4.0)
MCHC: 34.2 g/dL (ref 30.0–36.0)
MCV: 94.9 fl (ref 78.0–100.0)
Monocytes Absolute: 0.7 10*3/uL (ref 0.1–1.0)
Monocytes Relative: 10.7 % (ref 3.0–12.0)
Neutro Abs: 3.8 10*3/uL (ref 1.4–7.7)
Neutrophils Relative %: 59.8 % (ref 43.0–77.0)
Platelets: 243 10*3/uL (ref 150.0–400.0)
RBC: 4.44 Mil/uL (ref 3.87–5.11)
RDW: 12.3 % (ref 11.5–15.5)
WBC: 6.3 10*3/uL (ref 4.0–10.5)

## 2022-12-17 LAB — COMPREHENSIVE METABOLIC PANEL
ALT: 23 U/L (ref 0–35)
AST: 21 U/L (ref 0–37)
Albumin: 4.1 g/dL (ref 3.5–5.2)
Alkaline Phosphatase: 48 U/L (ref 39–117)
BUN: 18 mg/dL (ref 6–23)
CO2: 28 mEq/L (ref 19–32)
Calcium: 9.7 mg/dL (ref 8.4–10.5)
Chloride: 107 mEq/L (ref 96–112)
Creatinine, Ser: 0.7 mg/dL (ref 0.40–1.20)
GFR: 89.2 mL/min (ref >60.00)
Glucose, Bld: 115 mg/dL — ABNORMAL HIGH (ref 70–99)
Potassium: 4 mEq/L (ref 3.5–5.1)
Sodium: 143 mEq/L (ref 135–145)
Total Bilirubin: 0.4 mg/dL (ref 0.2–1.2)
Total Protein: 6.9 g/dL (ref 6.0–8.3)

## 2022-12-17 LAB — MICROALBUMIN / CREATININE URINE RATIO
Creatinine,U: 126.1 mg/dL
Microalb Creat Ratio: 2.1 mg/g (ref 0.0–30.0)
Microalb, Ur: 2.7 mg/dL — ABNORMAL HIGH (ref 0.0–1.9)

## 2022-12-17 LAB — LIPID PANEL
Cholesterol: 141 mg/dL (ref 0–200)
HDL: 49.1 mg/dL (ref >39.00)
LDL Cholesterol: 70 mg/dL (ref 0–99)
NonHDL: 91.51
Total CHOL/HDL Ratio: 3
Triglycerides: 106 mg/dL (ref 0.0–149.0)
VLDL: 21.2 mg/dL (ref 0.0–40.0)

## 2022-12-17 LAB — HEMOGLOBIN A1C: Hgb A1c MFr Bld: 6.1 % (ref 4.6–6.5)

## 2022-12-17 LAB — TSH: TSH: 1.56 u[IU]/mL (ref 0.35–5.50)

## 2022-12-17 MED ORDER — OZEMPIC (1 MG/DOSE) 4 MG/3ML ~~LOC~~ SOPN: PEN_INJECTOR | SUBCUTANEOUS | 2 refills | Status: DC

## 2022-12-17 NOTE — Assessment & Plan Note (Signed)
Chronic Controlled, Stable Continue Lexapro 10 mg daily 

## 2022-12-17 NOTE — Assessment & Plan Note (Signed)
Chronic Smokes 4-5 cigarettes a day At this point is not ready to quit

## 2022-12-17 NOTE — Assessment & Plan Note (Signed)
Chronic Check lipid panel, CMP Continue simvastatin 20 mg daily Regular exercise and healthy diet encouraged

## 2022-12-17 NOTE — Assessment & Plan Note (Signed)
Chronic Continue simvastatin 20 mg daily Encouraged regular exercise, healthy diet

## 2022-12-17 NOTE — Assessment & Plan Note (Signed)
Chronic Controlled, Stable Continue Viberzi 100 mg twice daily 

## 2022-12-17 NOTE — Assessment & Plan Note (Signed)
Chronic   Lab Results  Component Value Date   HGBA1C 6.0 06/12/2022   Sugars well controlled Check A1c, urine microalbumin today Continue Ozempic 1 mg weekly Stressed regular exercise, diabetic diet

## 2022-12-25 ENCOUNTER — Other Ambulatory Visit: Payer: Self-pay | Admitting: Internal Medicine

## 2022-12-25 ENCOUNTER — Other Ambulatory Visit: Payer: Self-pay

## 2022-12-25 MED ORDER — NYSTATIN 100000 UNIT/GM EX CREA: 1.0000 | TOPICAL_CREAM | Freq: Two times a day (BID) | CUTANEOUS | 0 refills | Status: AC

## 2023-02-07 DIAGNOSIS — H903 Sensorineural hearing loss, bilateral: Secondary | ICD-10-CM | POA: Diagnosis not present

## 2023-02-18 ENCOUNTER — Other Ambulatory Visit: Payer: Self-pay | Admitting: Internal Medicine

## 2023-02-24 ENCOUNTER — Telehealth: Payer: Medicare HMO | Admitting: Physician Assistant

## 2023-02-24 DIAGNOSIS — B9689 Other specified bacterial agents as the cause of diseases classified elsewhere: Secondary | ICD-10-CM

## 2023-02-24 DIAGNOSIS — J208 Acute bronchitis due to other specified organisms: Secondary | ICD-10-CM

## 2023-02-24 MED ORDER — PREDNISONE 20 MG PO TABS: 20.0000 mg | ORAL_TABLET | Freq: Every day | ORAL | 0 refills | Status: DC

## 2023-02-24 MED ORDER — AZITHROMYCIN 250 MG PO TABS: ORAL_TABLET | ORAL | 0 refills | Status: AC

## 2023-02-24 MED ORDER — PROMETHAZINE-DM 6.25-15 MG/5ML PO SYRP: 5.0000 mL | ORAL_SOLUTION | Freq: Four times a day (QID) | ORAL | 0 refills | Status: DC | PRN

## 2023-02-24 MED ORDER — BENZONATATE 100 MG PO CAPS: 100.0000 mg | ORAL_CAPSULE | Freq: Three times a day (TID) | ORAL | 0 refills | Status: DC | PRN

## 2023-02-24 NOTE — Patient Instructions (Signed)
Christina Buchanan, thank you for joining [name], PA-C for today's virtual visit.  While this provider is not your primary care provider (PCP), if your PCP is located in our provider database this encounter information will be shared with them immediately following your visit.   A [facility] MyChart account gives you access to today's visit and all your visits, tests, and labs performed at [facility] " click here if you don't have a [facility] MyChart account or go to [email or web address]  Consent: (Patient) Christina Buchanan provided verbal consent for this virtual visit at the beginning of the encounter.  Current Medications:  Current Outpatient Medications:    azithromycin (ZITHROMAX) 250 MG tablet, Take 2 tablets on day 1, then 1 tablet daily on days 2 through 5, Disp: 6 tablet, Rfl: 0   benzonatate (TESSALON) 100 MG capsule, Take 1 capsule (100 mg total) by mouth 3 (three) times daily as needed., Disp: 30 capsule, Rfl: 0   predniSONE (DELTASONE) 20 MG tablet, Take 1 tablet (20 mg total) by mouth daily with breakfast., Disp: 5 tablet, Rfl: 0   promethazine-dextromethorphan (PROMETHAZINE-DM) 6.25-15 MG/5ML syrup, Take 5 mLs by mouth 4 (four) times daily as needed., Disp: 118 mL, Rfl: 0   aspirin EC 81 MG tablet, Take 1 tablet (81 mg total) by mouth daily., Disp:  , Rfl:    Azelastine HCl 137 MCG/SPRAY SOLN, PLACE 1 SPRAY IN EACH NOSTRIL DAILY AS DIRECTED, Disp: 30 mL, Rfl: 8   Biotin w/ Vitamins C & E (HAIR SKIN & NAILS GUMMIES PO), Take by mouth., Disp: , Rfl:    cetirizine (ZYRTEC) 10 MG tablet, Take 10 mg by mouth daily as needed for allergies. , Disp: , Rfl:    COLLAGEN PO, Take by mouth. Gummy once a day, Disp: , Rfl:    Eluxadoline (VIBERZI) 100 MG TABS, TAKE 1 TABLET BY MOUTH TWICE A DAY, Disp: 60 tablet, Rfl: 5   escitalopram (LEXAPRO) 10 MG tablet, TAKE 1 TABLET BY MOUTH DAILY, Disp: 90 tablet, Rfl: 1   hydrocortisone (ANUSOL-HC) 2.5 % rectal  cream, PLACE 1 APPLICATION RECTALLY 2 (TWO) TIMES DAILY., Disp: 30 g, Rfl: 2   nystatin cream (MYCOSTATIN), Apply 1 Application topically 2 (two) times daily., Disp: 30 g, Rfl: 0   Semaglutide, 1 MG/DOSE, (OZEMPIC, 1 MG/DOSE,) 4 MG/3ML SOPN, DIAL AND INJECT UNDER THE SKIN 1 MG WEEKLY, Disp: 3 mL, Rfl: 2   simvastatin (ZOCOR) 20 MG tablet, TAKE 1 TABLET BY MOUTH DAILY AT 6 IN THE EVENING, Disp: 90 tablet, Rfl: 3   triamcinolone (KENALOG) 0.025 % ointment, Apply 1 Application topically 2 (two) times daily. Use for one to two weeks at a time., Disp: 30 g, Rfl: 1   Medications ordered in this encounter:  Meds ordered this encounter  Medications   azithromycin (ZITHROMAX) 250 MG tablet    Sig: Take 2 tablets on day 1, then 1 tablet daily on days 2 through 5    Dispense:  6 tablet    Refill:  0    Order Specific Question:   Supervising Provider    Answer:   Merrilee Jansky [[id number]]   promethazine-dextromethorphan (PROMETHAZINE-DM) 6.25-15 MG/5ML syrup    Sig: Take 5 mLs by mouth 4 (four) times daily as needed.    Dispense:  118 mL    Refill:  0    Order Specific Question:   Supervising Provider    Answer:   Merrilee Jansky [id number]   benzonatate (TESSALON)  100 MG capsule    Sig: Take 1 capsule (100 mg total) by mouth 3 (three) times daily as needed.    Dispense:  30 capsule    Refill:  0    Order Specific Question:   Supervising Provider    Answer:   Merrilee Jansky [[id number]]   predniSONE (DELTASONE) 20 MG tablet    Sig: Take 1 tablet (20 mg total) by mouth daily with breakfast.    Dispense:  5 tablet    Refill:  0    Order Specific Question:   Supervising Provider    Answer:   Merrilee Jansky [[id number]]     *If you need refills on other medications prior to your next appointment, please contact your pharmacy*  Follow-Up: Call back or seek an in-person evaluation if the symptoms worsen or if the condition fails to improve as anticipated.  [facility] [phone]  Other Instructions Acute Bronchitis, Adult  Acute bronchitis is sudden inflammation of the main airways (bronchi) that come off the windpipe (trachea) in the lungs. The swelling causes the airways to get smaller and make more mucus than normal. This can make it hard to breathe and can cause coughing or noisy breathing (wheezing). Acute bronchitis may last several weeks. The cough may last longer. Allergies, asthma, and exposure to smoke may make the condition worse. What are the causes? This condition can be caused by germs and by substances that irritate the lungs, including: Cold and flu viruses. The most common cause of this condition is the virus that causes the common cold. Bacteria. This is less common. Breathing in substances that irritate the lungs, including: Smoke from cigarettes and other forms of tobacco. Dust and pollen. Fumes from household cleaning products, gases, or burned fuel. Indoor or outdoor air pollution. What increases the risk? The following factors may make you more likely to develop this condition: A weak body's defense system, also called the immune system. A condition that affects your lungs and breathing, such as asthma. What are the signs or symptoms? Common symptoms of this condition include: Coughing. This may bring up clear, yellow, or green mucus from your lungs (sputum). Wheezing. Runny or stuffy nose. Having too much mucus in your lungs (chest congestion). Shortness of breath. Aches and pains, including sore throat or chest. How is this diagnosed? This condition is usually diagnosed based on: Your symptoms and medical history. A physical exam. You may also have other tests, including tests to rule out other conditions, such as pneumonia. These tests include: A test of lung function. Test of a mucus sample to look for the presence of bacteria. Tests to check the oxygen Buchanan in your blood. Blood tests. Chest X-ray. How is this  treated? Most cases of acute bronchitis clear up over time without treatment. Your health care provider may recommend: Drinking more fluids to help thin your mucus so it is easier to cough up. Taking inhaled medicine (inhaler) to improve air flow in and out of your lungs. Using a vaporizer or a humidifier. These are machines that add water to the air to help you breathe better. Taking a medicine that thins mucus and clears congestion (expectorant). Taking a medicine that prevents or stops coughing (cough suppressant). It is not common to take an antibiotic medicine for this condition. Follow these instructions at home:  Take over-the-counter and prescription medicines only as told by your health care provider. Use an inhaler, vaporizer, or humidifier as told by your  health care provider. Take two teaspoons (10 mL) of honey at bedtime to lessen coughing at night. Drink enough fluid to keep your urine pale yellow. Do not use any products that contain nicotine or tobacco. These products include cigarettes, chewing tobacco, and vaping devices, such as e-cigarettes. If you need help quitting, ask your health care provider. Get plenty of rest. Return to your normal activities as told by your health care provider. Ask your health care provider what activities are safe for you. Keep all follow-up visits. This is important. How is this prevented? To lower your risk of getting this condition again: Wash your hands often with soap and water for at least 20 seconds. If soap and water are not available, use hand sanitizer. Avoid contact with people who have cold symptoms. Try not to touch your mouth, nose, or eyes with your hands. Avoid breathing in smoke or chemical fumes. Breathing smoke or chemical fumes will make your condition worse. Get the flu shot every year. Contact a health care provider if: Your symptoms do not improve after 2 weeks. You have trouble coughing up the mucus. Your cough keeps  you awake at night. You have a fever. Get help right away if you: Cough up blood. Feel pain in your chest. Have severe shortness of breath. Faint or keep feeling like you are going to faint. Have a severe headache. Have a fever or chills that get worse. These symptoms may represent a serious problem that is an emergency. Do not wait to see if the symptoms will go away. Get medical help right away. Call your local emergency services (911 in the [place] Do not drive yourself to the hospital. Summary Acute bronchitis is inflammation of the main airways (bronchi) that come off the windpipe (trachea) in the lungs. The swelling causes the airways to get smaller and make more mucus than normal. Drinking more fluids can help thin your mucus so it is easier to cough up. Take over-the-counter and prescription medicines only as told by your health care provider. Do not use any products that contain nicotine or tobacco. These products include cigarettes, chewing tobacco, and vaping devices, such as e-cigarettes. If you need help quitting, ask your health care provider. Contact a health care provider if your symptoms do not improve after 2 weeks. This information is not intended to replace advice given to you by your health care provider. Make sure you discuss any questions you have with your health care provider. Document Revised: 01/02/2022 Document Reviewed: 01/23/2021 Elsevier Patient Education  2023 Elsevier Inc.    If you have been instructed to have an in-person evaluation today at a local Urgent Care facility, please use the link below. It will take you to a list of all of our available [facility], including address, phone number and hours of operation. Please do not delay care.  [facility]  If you or a family member do not have a primary care provider, use the link below to schedule a visit and establish care. When you choose a [facility] primary care physician or  advanced practice provider, you gain a long-term partner in health. Find a Primary Care Provider  Learn more about [facility]'s in-office and virtual care options: [facility] - Get Care Now

## 2023-02-24 NOTE — Progress Notes (Signed)
Virtual Visit Consent   Christina Buchanan, you are scheduled for a virtual visit with a [facility] provider today. Just as with appointments in the office, your consent must be obtained to participate. Your consent will be active for this visit and any virtual visit you may have with one of our providers in the next 365 days. If you have a MyChart account, a copy of this consent can be sent to you electronically.  As this is a virtual visit, video technology does not allow for your provider to perform a traditional examination. This may limit your provider's ability to fully assess your condition. If your provider identifies any concerns that need to be evaluated in person or the need to arrange testing (such as labs, EKG, etc.), we will make arrangements to do so. Although advances in technology are sophisticated, we cannot ensure that it will always work on either your end or our end. If the connection with a video visit is poor, the visit may have to be switched to a telephone visit. With either a video or telephone visit, we are not always able to ensure that we have a secure connection.  By engaging in this virtual visit, you consent to the provision of healthcare and authorize for your insurance to be billed (if applicable) for the services provided during this visit. Depending on your insurance coverage, you may receive a charge related to this service.  I need to obtain your verbal consent now. Are you willing to proceed with your visit today? [name] has provided verbal consent on 02/24/2023 for a virtual visit (video or telephone). [name], PA-C  Date: 02/24/2023 4:49 PM  Virtual Visit via Video Note   I, [name], connected with  Christina Buchanan  ([id number], November 17, 1955) on 02/24/23 at  4:45 PM EDT by a video-enabled telemedicine application and verified that I am speaking with the correct person using two identifiers.  Location: Patient: Virtual  Visit Location Patient: Home Provider: Virtual Visit Location Provider: Home Office   I discussed the limitations of evaluation and management by telemedicine and the availability of in person appointments. The patient expressed understanding and agreed to proceed.    History of Present Illness: Christina Buchanan is a 67 y.o. who identifies as a female who was assigned female at birth, and is being seen today for cough.  HPI: Cough This is a new problem. The current episode started 1 to 4 weeks ago. The problem has been gradually worsening. The problem occurs every few minutes. The cough is Productive of sputum and productive of purulent sputum. Associated symptoms include chest pain (tightness), chills, a fever (low grade, subjective), myalgias, nasal congestion and postnasal drip. Pertinent negatives include no ear congestion, ear pain, headaches, rhinorrhea, sore throat, shortness of breath or wheezing. Associated symptoms comments: Fatigue, malaise. The symptoms are aggravated by lying down. She has tried OTC cough suppressant (sudafed, advil cold and sinus) for the symptoms. The treatment provided mild relief. Her past medical history is significant for bronchitis.     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 10/02/2016   Diabetes (HCC) 08/12/2016   Hyperlipidemia 04/29/2016   Tobacco dependence due to cigarettes 01/21/2010    Depression 01/21/2010   ALLERGIC RHINITIS 01/21/2010   Personal history of colonic polyps 07/30/2006    Allergies:  Allergies  Allergen Reactions   Penicillins Nausea And Vomiting   Metformin And Related     Loose stools   Medications:  Current Outpatient Medications:    azithromycin (ZITHROMAX) 250 MG tablet, Take 2 tablets on day 1, then 1 tablet daily on days 2 through 5, Disp: 6 tablet, Rfl: 0   benzonatate (TESSALON) 100 MG capsule, Take 1 capsule (100 mg total) by mouth 3 (three) times daily as needed., Disp: 30 capsule, Rfl: 0   predniSONE (DELTASONE) 20 MG tablet, Take 1 tablet (20 mg total) by mouth daily with breakfast., Disp: 5 tablet, Rfl: 0   promethazine-dextromethorphan (PROMETHAZINE-DM) 6.25-15 MG/5ML syrup, Take 5 mLs by mouth 4 (four) times daily as needed., Disp: 118 mL, Rfl: 0   aspirin EC 81 MG tablet, Take 1 tablet (81 mg total) by mouth daily., Disp:  , Rfl:    Azelastine HCl 137 MCG/SPRAY SOLN, PLACE 1 SPRAY IN EACH NOSTRIL DAILY AS DIRECTED, Disp: 30 mL, Rfl: 8   Biotin w/ Vitamins C & E (HAIR SKIN & NAILS GUMMIES PO), Take by mouth., Disp: , Rfl:    cetirizine (ZYRTEC) 10 MG tablet, Take 10 mg by mouth daily as needed for allergies. , Disp: , Rfl:    COLLAGEN PO, Take by mouth. Gummy once a day, Disp: , Rfl:    Eluxadoline (VIBERZI) 100 MG TABS, TAKE 1 TABLET BY MOUTH TWICE A DAY, Disp: 60 tablet, Rfl: 5   escitalopram (LEXAPRO) 10 MG tablet, TAKE 1 TABLET BY MOUTH DAILY, Disp: 90 tablet, Rfl: 1   hydrocortisone (ANUSOL-HC) 2.5 % rectal cream, PLACE 1 APPLICATION RECTALLY 2 (TWO) TIMES DAILY., Disp: 30 g, Rfl: 2   nystatin cream (MYCOSTATIN), Apply 1 Application topically 2 (two) times daily., Disp: 30 g, Rfl: 0   Semaglutide, 1 MG/DOSE, (OZEMPIC, 1 MG/DOSE,) 4 MG/3ML SOPN, DIAL AND INJECT UNDER THE SKIN 1 MG WEEKLY, Disp: 3 mL, Rfl: 2   simvastatin (ZOCOR) 20 MG tablet, TAKE 1 TABLET BY MOUTH DAILY AT 6 IN THE EVENING, Disp: 90 tablet, Rfl: 3   triamcinolone  (KENALOG) 0.025 % ointment, Apply 1 Application topically 2 (two) times daily. Use for one to two weeks at a time., Disp: 30 g, Rfl: 1  Observations/Objective: Patient is well-developed, well-nourished in no acute distress.  Resting comfortably  at home.  Head is normocephalic, atraumatic.  No labored breathing.  Speech is clear and coherent with logical content.  Patient is alert and oriented at baseline.    Assessment and Plan: 1. Acute bacterial bronchitis - azithromycin (ZITHROMAX) 250 MG tablet; Take 2 tablets on day 1, then 1 tablet daily on days 2 through 5  Dispense: 6 tablet; Refill: 0 - promethazine-dextromethorphan (PROMETHAZINE-DM) 6.25-15 MG/5ML syrup; Take 5 mLs by mouth 4 (four) times daily as needed.  Dispense: 118 mL; Refill: 0 - benzonatate (TESSALON) 100 MG capsule; Take 1 capsule (100 mg total) by mouth 3 (three) times daily as needed.  Dispense: 30 capsule; Refill: 0 - predniSONE (DELTASONE) 20 MG tablet; Take 1 tablet (20 mg total) by mouth daily with breakfast.  Dispense: 5  tablet; Refill: 0  - Worsening over a week despite OTC medications - Will treat with Z-pack, Prednisone, Promethazine DM, and tessalon perles - Can continue Mucinex  - Push fluids.  - Rest.  - Steam and humidifier can help - Seek in person evaluation if worsening or symptoms fail to improve    Follow Up Instructions: I discussed the assessment and treatment plan with the patient. The patient was provided an opportunity to ask questions and all were answered. The patient agreed with the plan and demonstrated an understanding of the instructions.  A copy of instructions were sent to the patient via MyChart unless otherwise noted below.    The patient was advised to call back or seek an in-person evaluation if the symptoms worsen or if the condition fails to improve as anticipated.  Time:  I spent 10 minutes with the patient via telehealth technology discussing the above problems/concerns.     [name], PA-C

## 2023-03-16 DIAGNOSIS — G4733 Obstructive sleep apnea (adult) (pediatric): Secondary | ICD-10-CM | POA: Diagnosis not present

## 2023-04-06 ENCOUNTER — Other Ambulatory Visit: Payer: Self-pay | Admitting: Internal Medicine

## 2023-04-06 DIAGNOSIS — E119 Type 2 diabetes mellitus without complications: Secondary | ICD-10-CM

## 2023-04-07 ENCOUNTER — Other Ambulatory Visit: Payer: Self-pay | Admitting: Internal Medicine

## 2023-04-07 DIAGNOSIS — E119 Type 2 diabetes mellitus without complications: Secondary | ICD-10-CM

## 2023-04-07 DIAGNOSIS — Z0100 Encounter for examination of eyes and vision without abnormal findings: Secondary | ICD-10-CM | POA: Diagnosis not present

## 2023-04-07 DIAGNOSIS — H43811 Vitreous degeneration, right eye: Secondary | ICD-10-CM | POA: Diagnosis not present

## 2023-04-08 ENCOUNTER — Other Ambulatory Visit: Payer: Self-pay | Admitting: Internal Medicine

## 2023-04-08 DIAGNOSIS — E119 Type 2 diabetes mellitus without complications: Secondary | ICD-10-CM

## 2023-04-14 DIAGNOSIS — H903 Sensorineural hearing loss, bilateral: Secondary | ICD-10-CM | POA: Diagnosis not present

## 2023-04-22 DIAGNOSIS — H903 Sensorineural hearing loss, bilateral: Secondary | ICD-10-CM | POA: Diagnosis not present

## 2023-05-21 ENCOUNTER — Encounter (INDEPENDENT_AMBULATORY_CARE_PROVIDER_SITE_OTHER): Payer: Self-pay

## 2023-06-04 ENCOUNTER — Encounter: Payer: Self-pay | Admitting: Internal Medicine

## 2023-06-04 DIAGNOSIS — H903 Sensorineural hearing loss, bilateral: Secondary | ICD-10-CM | POA: Diagnosis not present

## 2023-06-04 MED ORDER — TIRZEPATIDE 2.5 MG/0.5ML ~~LOC~~ SOAJ: 2.5000 mg | SUBCUTANEOUS | 0 refills | Status: DC

## 2023-06-11 ENCOUNTER — Other Ambulatory Visit: Payer: Self-pay | Admitting: Internal Medicine

## 2023-06-16 NOTE — Progress Notes (Unsigned)
Subjective:    Patient ID: Christina Buchanan, female    DOB: 04-17-1956, 67 y.o.   MRN: [id number]     HPI Debbie is here for follow up of her chronic medical problems.  She is walking and eating well.  Mounjaro sent to pharmacy - ? Covered or not - did not hear anything from them.  Still on ozempic.   Medications and allergies reviewed with patient and updated if appropriate.  Current Outpatient Medications on File Prior to Visit  Medication Sig Dispense Refill   aspirin EC 81 MG tablet Take 1 tablet (81 mg total) by mouth daily.     Azelastine HCl 137 MCG/SPRAY SOLN PLACE 1 SPRAY IN EACH NOSTRIL DAILY AS DIRECTED 30 mL 8   Biotin w/ Vitamins C & E (HAIR SKIN & NAILS GUMMIES PO) Take by mouth.     cetirizine (ZYRTEC) 10 MG tablet Take 10 mg by mouth daily as needed for allergies.      COLLAGEN PO Take by mouth. Gummy once a day     Eluxadoline (VIBERZI) 100 MG TABS TAKE 1 TABLET BY MOUTH TWICE A DAY 60 tablet 5   escitalopram (LEXAPRO) 10 MG tablet TAKE 1 TABLET BY MOUTH DAILY 90 tablet 1   hydrocortisone (ANUSOL-HC) 2.5 % rectal cream PLACE 1 APPLICATION RECTALLY 2 (TWO) TIMES DAILY. 30 g 2   nystatin cream (MYCOSTATIN) Apply 1 Application topically 2 (two) times daily. 30 g 0   simvastatin (ZOCOR) 20 MG tablet TAKE 1 TABLET BY MOUTH DAILY AT 6 IN THE EVENING 90 tablet 3   triamcinolone (KENALOG) 0.025 % ointment Apply 1 Application topically 2 (two) times daily. Use for one to two weeks at a time. 30 g 1   No current facility-administered medications on file prior to visit.     Review of Systems  Constitutional:  Negative for fever.  Respiratory:  Negative for cough, shortness of breath and wheezing.   Cardiovascular:  Negative for chest pain, palpitations and leg swelling.  Neurological:  Negative for light-headedness and headaches.       Objective:   Vitals:   06/17/23 0945  BP: 130/76  Pulse: 62  Temp: 98.4 F (36.9 C)  SpO2: 93%   BP Readings from  Last 3 Encounters:  06/17/23 130/76  12/17/22 122/74  06/12/22 126/80   Wt Readings from Last 3 Encounters:  06/17/23 151 lb 12.8 oz (68.9 kg)  12/17/22 152 lb 12.8 oz (69.3 kg)  06/12/22 149 lb (67.6 kg)   Body mass index is 26.89 kg/m.    Physical Exam Constitutional:      General: She is not in acute distress.    Appearance: Normal appearance.  HENT:     Head: Normocephalic and atraumatic.  Eyes:     Conjunctiva/sclera: Conjunctivae normal.  Cardiovascular:     Rate and Rhythm: Normal rate and regular rhythm.     Heart sounds: Normal heart sounds.  Pulmonary:     Effort: Pulmonary effort is normal. No respiratory distress.     Breath sounds: Normal breath sounds. No wheezing.  Musculoskeletal:     Cervical back: Neck supple.     Right lower leg: No edema.     Left lower leg: No edema.  Lymphadenopathy:     Cervical: No cervical adenopathy.  Skin:    General: Skin is warm and dry.     Findings: No rash.  Neurological:     Mental Status: She is alert. Mental  status is at baseline.  Psychiatric:        Mood and Affect: Mood normal.        Behavior: Behavior normal.        Lab Results  Component Value Date   WBC 6.3 12/17/2022   HGB 14.4 12/17/2022   HCT 42.1 12/17/2022   PLT 243.0 12/17/2022   GLUCOSE 115 (H) 12/17/2022   CHOL 141 12/17/2022   TRIG 106.0 12/17/2022   HDL 49.10 12/17/2022   LDLDIRECT 153.6 12/12/2011   LDLCALC 70 12/17/2022   ALT 23 12/17/2022   AST 21 12/17/2022   NA 143 12/17/2022   K 4.0 12/17/2022   CL 107 12/17/2022   CREATININE 0.70 12/17/2022   BUN 18 12/17/2022   CO2 28 12/17/2022   TSH 1.56 12/17/2022   HGBA1C 6.1 12/17/2022   MICROALBUR 2.7 (H) 12/17/2022     Assessment & Plan:    See Problem List for Assessment and Plan of chronic medical problems.

## 2023-06-16 NOTE — Patient Instructions (Addendum)
     Call and schedule your mammogram - [facility] Schedule an appointment by calling [phone]    Blood work was ordered.   The lab is on the first floor.    Medications changes include :   none     Return in about 6 months (around 12/15/2023) for Physical Exam.

## 2023-06-17 ENCOUNTER — Encounter: Payer: Self-pay | Admitting: Internal Medicine

## 2023-06-17 ENCOUNTER — Ambulatory Visit (INDEPENDENT_AMBULATORY_CARE_PROVIDER_SITE_OTHER): Payer: Medicare HMO | Admitting: Internal Medicine

## 2023-06-17 VITALS — BP 130/76 | HR 62 | Temp 98.4°F | Ht 63.0 in | Wt 151.8 lb

## 2023-06-17 DIAGNOSIS — E782 Mixed hyperlipidemia: Secondary | ICD-10-CM

## 2023-06-17 DIAGNOSIS — Z7985 Long-term (current) use of injectable non-insulin antidiabetic drugs: Secondary | ICD-10-CM | POA: Diagnosis not present

## 2023-06-17 DIAGNOSIS — K580 Irritable bowel syndrome with diarrhea: Secondary | ICD-10-CM | POA: Diagnosis not present

## 2023-06-17 DIAGNOSIS — F3289 Other specified depressive episodes: Secondary | ICD-10-CM | POA: Diagnosis not present

## 2023-06-17 DIAGNOSIS — E1169 Type 2 diabetes mellitus with other specified complication: Secondary | ICD-10-CM | POA: Diagnosis not present

## 2023-06-17 LAB — COMPREHENSIVE METABOLIC PANEL
ALT: 26 U/L (ref 0–35)
AST: 22 U/L (ref 0–37)
Albumin: 4.1 g/dL (ref 3.5–5.2)
Alkaline Phosphatase: 51 U/L (ref 39–117)
BUN: 18 mg/dL (ref 6–23)
CO2: 26 meq/L (ref 19–32)
Calcium: 9.6 mg/dL (ref 8.4–10.5)
Chloride: 106 meq/L (ref 96–112)
Creatinine, Ser: 0.68 mg/dL (ref 0.40–1.20)
GFR: 89.52 mL/min (ref >60.00)
Glucose, Bld: 123 mg/dL — ABNORMAL HIGH (ref 70–99)
Potassium: 4.2 meq/L (ref 3.5–5.1)
Sodium: 138 meq/L (ref 135–145)
Total Bilirubin: 0.4 mg/dL (ref 0.2–1.2)
Total Protein: 7 g/dL (ref 6.0–8.3)

## 2023-06-17 LAB — LIPID PANEL
Cholesterol: 150 mg/dL (ref 0–200)
HDL: 47.7 mg/dL (ref >39.00)
LDL Cholesterol: 75 mg/dL (ref 0–99)
NonHDL: 102.66
Total CHOL/HDL Ratio: 3
Triglycerides: 138 mg/dL (ref 0.0–149.0)
VLDL: 27.6 mg/dL (ref 0.0–40.0)

## 2023-06-17 LAB — HEMOGLOBIN A1C: Hgb A1c MFr Bld: 6.3 % (ref 4.6–6.5)

## 2023-06-17 MED ORDER — TIRZEPATIDE 2.5 MG/0.5ML ~~LOC~~ SOAJ: 2.5000 mg | SUBCUTANEOUS | 0 refills | Status: DC

## 2023-06-17 NOTE — Assessment & Plan Note (Addendum)
Chronic   Lab Results  Component Value Date   HGBA1C 6.1 12/17/2022   Sugars well controlled Check A1c Still on ozempic - doing well on it but has started to gain weight start mounjaro 2.5 mg weekly - will titrate - if covered will stop ozempic Stressed regular exercise, diabetic diet

## 2023-06-17 NOTE — Assessment & Plan Note (Signed)
Chronic Controlled, Stable Continue Lexapro 10 mg daily 

## 2023-06-17 NOTE — Assessment & Plan Note (Signed)
Chronic Check lipid panel, CMP Continue simvastatin 20 mg daily Regular exercise and healthy diet encouraged

## 2023-06-17 NOTE — Assessment & Plan Note (Signed)
Chronic Controlled, Stable Continue Viberzi 100 mg twice daily 

## 2023-07-03 ENCOUNTER — Encounter: Payer: Self-pay | Admitting: Internal Medicine

## 2023-07-03 ENCOUNTER — Telehealth: Payer: Self-pay

## 2023-07-03 ENCOUNTER — Other Ambulatory Visit: Payer: Self-pay | Admitting: Internal Medicine

## 2023-07-04 ENCOUNTER — Other Ambulatory Visit: Payer: Self-pay | Admitting: Internal Medicine

## 2023-07-06 ENCOUNTER — Telehealth: Payer: Self-pay | Admitting: Internal Medicine

## 2023-07-06 ENCOUNTER — Other Ambulatory Visit (HOSPITAL_COMMUNITY): Payer: Self-pay

## 2023-07-06 ENCOUNTER — Other Ambulatory Visit: Payer: Self-pay | Admitting: Internal Medicine

## 2023-07-06 NOTE — Telephone Encounter (Signed)
Sent in today 

## 2023-07-06 NOTE — Telephone Encounter (Signed)
"  When I was at the office the other day, I did not realize that some of my meds were expiring.  Could you send a new prescription to [facility] for my Viberzi.   Thanks ... stay safe."  Pt sent this msg through via my chart and want this done ASAP because she is out of medication and she will be going out of town by noon.Marland KitchenMarland Kitchen

## 2023-07-16 ENCOUNTER — Encounter: Payer: Self-pay | Admitting: Internal Medicine

## 2023-07-16 DIAGNOSIS — R9230 Dense breasts, unspecified: Secondary | ICD-10-CM

## 2023-07-16 DIAGNOSIS — Z803 Family history of malignant neoplasm of breast: Secondary | ICD-10-CM

## 2023-07-17 ENCOUNTER — Other Ambulatory Visit: Payer: Self-pay

## 2023-07-17 MED ORDER — TIRZEPATIDE 5 MG/0.5ML ~~LOC~~ SOAJ: 5.0000 mg | SUBCUTANEOUS | 0 refills | Status: DC

## 2023-07-28 DIAGNOSIS — G4733 Obstructive sleep apnea (adult) (pediatric): Secondary | ICD-10-CM | POA: Diagnosis not present

## 2023-08-03 ENCOUNTER — Other Ambulatory Visit: Payer: Self-pay | Admitting: Internal Medicine

## 2023-08-03 ENCOUNTER — Other Ambulatory Visit: Payer: Self-pay

## 2023-08-03 DIAGNOSIS — Z1231 Encounter for screening mammogram for malignant neoplasm of breast: Secondary | ICD-10-CM

## 2023-08-03 MED ORDER — TIRZEPATIDE-WEIGHT MANAGEMENT 7.5 MG/0.5ML ~~LOC~~ SOAJ: 7.5000 mg | SUBCUTANEOUS | 0 refills | Status: DC

## 2023-08-03 NOTE — Telephone Encounter (Signed)
Error

## 2023-08-11 DIAGNOSIS — G4733 Obstructive sleep apnea (adult) (pediatric): Secondary | ICD-10-CM | POA: Diagnosis not present

## 2023-08-12 ENCOUNTER — Other Ambulatory Visit: Payer: Self-pay

## 2023-08-12 MED ORDER — TIRZEPATIDE-WEIGHT MANAGEMENT 7.5 MG/0.5ML ~~LOC~~ SOAJ: 7.5000 mg | SUBCUTANEOUS | 0 refills | Status: DC

## 2023-08-14 ENCOUNTER — Other Ambulatory Visit: Payer: Self-pay | Admitting: Medical Genetics

## 2023-08-14 DIAGNOSIS — Z006 Encounter for examination for normal comparison and control in clinical research program: Secondary | ICD-10-CM

## 2023-09-01 ENCOUNTER — Ambulatory Visit
Admission: RE | Admit: 2023-09-01 | Discharge: 2023-09-01 | Disposition: A | Discharge status: Home / Self Care | Payer: Medicare HMO | Source: Ambulatory Visit | Attending: Internal Medicine | Admitting: Internal Medicine

## 2023-09-01 DIAGNOSIS — Z1231 Encounter for screening mammogram for malignant neoplasm of breast: Secondary | ICD-10-CM

## 2023-09-07 ENCOUNTER — Other Ambulatory Visit: Payer: Self-pay

## 2023-09-07 MED ORDER — TIRZEPATIDE 7.5 MG/0.5ML ~~LOC~~ SOAJ: 7.5000 mg | SUBCUTANEOUS | 2 refills | Status: DC

## 2023-09-08 ENCOUNTER — Encounter: Payer: Self-pay | Admitting: Internal Medicine

## 2023-09-08 ENCOUNTER — Other Ambulatory Visit: Payer: Self-pay

## 2023-09-10 DIAGNOSIS — G4733 Obstructive sleep apnea (adult) (pediatric): Secondary | ICD-10-CM | POA: Diagnosis not present

## 2023-09-14 ENCOUNTER — Other Ambulatory Visit (HOSPITAL_COMMUNITY)
Admission: RE | Admit: 2023-09-14 | Discharge: 2023-09-14 | Disposition: A | Discharge status: Home / Self Care | Payer: Self-pay | Source: Ambulatory Visit | Attending: Medical Genetics | Admitting: Medical Genetics

## 2023-09-14 DIAGNOSIS — Z006 Encounter for examination for normal comparison and control in clinical research program: Secondary | ICD-10-CM | POA: Insufficient documentation

## 2023-09-22 LAB — GENECONNECT MOLECULAR SCREEN: Genetic Analysis Overall Interpretation: NEGATIVE

## 2023-09-28 ENCOUNTER — Encounter: Payer: Self-pay | Admitting: Internal Medicine

## 2023-09-28 ENCOUNTER — Other Ambulatory Visit: Payer: Self-pay

## 2023-09-28 MED ORDER — TIRZEPATIDE 10 MG/0.5ML ~~LOC~~ SOAJ: 10.0000 mg | SUBCUTANEOUS | 0 refills | Status: DC

## 2023-09-28 NOTE — Telephone Encounter (Signed)
 Care team updated and letter sent for eye exam notes.

## 2023-10-11 DIAGNOSIS — G4733 Obstructive sleep apnea (adult) (pediatric): Secondary | ICD-10-CM | POA: Diagnosis not present

## 2023-10-12 ENCOUNTER — Inpatient Hospital Stay: Payer: Medicare HMO | Attending: Hematology and Oncology | Admitting: Hematology and Oncology

## 2023-10-12 ENCOUNTER — Inpatient Hospital Stay: Payer: Medicare HMO

## 2023-10-12 VITALS — BP 155/82 | HR 74 | Temp 97.3°F | Resp 18 | Ht 63.0 in | Wt 153.0 lb

## 2023-10-12 DIAGNOSIS — Z9189 Other specified personal risk factors, not elsewhere classified: Secondary | ICD-10-CM | POA: Insufficient documentation

## 2023-10-12 DIAGNOSIS — Z1501 Genetic susceptibility to malignant neoplasm of breast: Secondary | ICD-10-CM | POA: Insufficient documentation

## 2023-10-12 DIAGNOSIS — Z803 Family history of malignant neoplasm of breast: Secondary | ICD-10-CM | POA: Insufficient documentation

## 2023-10-12 NOTE — Progress Notes (Signed)
 [facility] CONSULT NOTE  Patient Care Team: Geofm Glade PARAS, MD as PCP - General (Internal Medicine) Romine, Montie SQUIBB, MD (Obstetrics and Gynecology) Kristie Lamprey, MD (Gastroenterology) [facility] [place], [place]  CHIEF COMPLAINTS/PURPOSE OF CONSULTATION:  At high risk for breast cancer  HISTORY OF PRESENTING ILLNESS:   History of Present Illness   The patient, with a history of dense breasts and a family history of breast cancer, presents for a consultation. She has also had a portion of her colon removed due to precancerous polyps. She underwent genetic testing, which was essentially normal. She expresses concern about her risk of breast cancer, given her dense breasts and family history. She mentions that she has had multiple breast biopsies in the past, with no proliferative disease.        I reviewed her records extensively and collaborated the history with the patient.  SUMMARY OF ONCOLOGIC HISTORY: Oncology History   No history exists.     MEDICAL HISTORY:  Past Medical History:  Diagnosis Date   ALLERGIC RHINITIS    Arthritis    hands   DEPRESSION    Diabetes mellitus without complication (HCC) 2003   diet controlled   Family history of breast cancer    Fibroid, uterine 04/29/2016   HYPERLIPIDEMIA    OSA (obstructive sleep apnea) 12/15/2017   Osteopenia    Personal history of colonic polyps    SMOKER    VITAMIN D  DEFICIENCY     SURGICAL HISTORY: Past Surgical History:  Procedure Laterality Date   ABDOMINAL HYSTERECTOMY N/A 10/02/2016   Procedure: HYSTERECTOMY ABDOMINAL;  Surgeon: Bobie FORBES Cathlyn JAYSON Nikki, MD;  Location: [facility];  Service: Gynecology;  Laterality: N/A;  4 hours   ABDOMINAL SACROCOLPOPEXY N/A 10/02/2016   Procedure: ABDOMINO SACROCOLPOPEXY;  Surgeon: Bobie FORBES Cathlyn JAYSON Nikki, MD;  Location: [facility];  Service: Gynecology;  Laterality: N/A;   ANTERIOR AND POSTERIOR REPAIR N/A 10/02/2016   Procedure: ANTERIOR (CYSTOCELE) AND POSTERIOR  REPAIR (RECTOCELE);  Surgeon: Bobie FORBES Cathlyn JAYSON Nikki, MD;  Location: [facility];  Service: Gynecology;  Laterality: N/A;   BLADDER SUSPENSION N/A 10/02/2016   Procedure: TRANSVAGINAL TAPE (TVT) PROCEDURE exact midurethral sling;  Surgeon: Bobie FORBES Cathlyn JAYSON Nikki, MD;  Location: [facility];  Service: Gynecology;  Laterality: N/A;   BREAST EXCISIONAL BIOPSY Left 09/2019   BREAST LUMPECTOMY WITH RADIOACTIVE SEED LOCALIZATION Left 09/08/2019   Procedure: LEFT BREAST LUMPECTOMY WITH RADIOACTIVE SEED LOCALIZATION;  Surgeon: Vernetta Berg, MD;  Location: [facility];  Service: General;  Laterality: Left;   BREAST SURGERY  10/06/1988   breast biopsy   COLONOSCOPY W/ BIOPSIES  06/06/2006   2 polyps & tubuvillous adenoma   COLONOSCOPY W/ BIOPSIES  11/13/2008   sigmoid ddiverticuli and rechek in 5 years - Dr. 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   SALPINGOOPHORECTOMY Bilateral 10/02/2016   Procedure: SALPINGO OOPHORECTOMY;  Surgeon: Bobie FORBES Cathlyn JAYSON Nikki, MD;  Location: [facility];  Service: Gynecology;  Laterality: Bilateral;   UPPER GASTROINTESTINAL ENDOSCOPY  06/15/2006   2 polyps - H pylori    SOCIAL HISTORY: Social History   Socioeconomic History   Marital status: Married    Spouse name: Not on  file   Number of children: Not on file   Years of education: Not on file   Highest education level: Not on file  Occupational History   Not on file  Tobacco Use   Smoking status: Every Day    Current packs/day: 0.30    Average packs/day: 0.3 packs/day for 30.0 years (9.0 ttl pk-yrs)    Types: Cigarettes   Smokeless tobacco: Never   Tobacco comments:    pt smokes 5 cigarettes per day. Married, lives with spouse. Cares for  elderly parent and in-laws  Vaping Use   Vaping status: Never Used  Substance and Sexual Activity   Alcohol use: Yes    Alcohol/week: 2.0 standard drinks of alcohol    Types: 2 Standard drinks or equivalent per week    Comment: Rare glass of wine every 2-3 weeks   Drug use: No   Sexual activity: Yes    Partners: Male    Birth control/protection: Other-see comments, Post-menopausal    Comment: husband had vasectomy  Other Topics Concern   Not on file  Social History Narrative   Lives with husband in a one story home.  She has 3 grown children.     Owns a surveyor, quantity.     Education: high school.        No regular exercise   Social Drivers of Corporate Investment Banker Strain: Not on file  Food Insecurity: Not on file  Transportation Needs: Not on file  Physical Activity: Not on file  Stress: Not on file  Social Connections: Not on file  Intimate Partner Violence: Not on file    FAMILY HISTORY: Family History  Problem Relation Age of Onset   Breast cancer Mother 78   Diabetes Mother    Stroke Mother                Hyperlipidemia Mother    Heart failure Mother    Hypertension Mother    COPD Mother    Hypertension Father    Heart disease Father    Hyperlipidemia Father    Heart failure Father        dec at age 16   Breast cancer Sister 1   Heart disease Sister        Afib   Psoriasis Son     ALLERGIES:  is allergic to penicillins and metformin  and related.  MEDICATIONS:  Current Outpatient Medications  Medication Sig Dispense Refill   aspirin  EC 81 MG tablet Take 1 tablet (81 mg total) by mouth daily.     Azelastine  HCl 137 MCG/SPRAY SOLN PLACE 1 SPRAY IN EACH NOSTRIL DAILY AS DIRECTED 30 mL 8   Biotin w/ Vitamins C & E (HAIR SKIN & NAILS GUMMIES PO) Take by mouth.     cetirizine (ZYRTEC) 10 MG tablet Take 10 mg by mouth daily as needed for allergies.      COLLAGEN PO Take by mouth. Gummy once a day     Eluxadoline  (VIBERZI ) 100 MG TABS TAKE 1  TABLET BY MOUTH 2 TIMES A DAY 60 tablet 5   escitalopram  (LEXAPRO ) 10 MG tablet TAKE 1 TABLET BY MOUTH DAILY 90 tablet 1   hydrocortisone  (ANUSOL -HC) 2.5 % rectal cream PLACE 1 APPLICATION RECTALLY 2 (TWO) TIMES DAILY. 30 g 2   nystatin  cream (MYCOSTATIN ) Apply 1 Application topically 2 (two) times daily. 30 g 0   simvastatin  (ZOCOR ) 20 MG tablet TAKE 1 TABLET BY MOUTH DAILY AT 6 IN THE EVENING  90 tablet 3   tirzepatide  (MOUNJARO ) 10 MG/0.5ML Pen Inject 10 mg into the skin once a week. 6 mL 0   tirzepatide  (MOUNJARO ) 2.5 MG/0.5ML Pen Inject 2.5 mg into the skin once a week. 2 mL 0   tirzepatide  (MOUNJARO ) 5 MG/0.5ML Pen Inject 5 mg into the skin once a week. 6 mL 0   tirzepatide  (MOUNJARO ) 7.5 MG/0.5ML Pen Inject 7.5 mg into the skin once a week. 2 mL 2   triamcinolone  (KENALOG ) 0.025 % ointment Apply 1 Application topically 2 (two) times daily. Use for one to two weeks at a time. 30 g 1   No current facility-administered medications for this visit.    REVIEW OF SYSTEMS:   Constitutional: Denies fevers, chills or abnormal night sweats Breast:  Denies any palpable lumps or discharge All other systems were reviewed with the patient and are negative.  PHYSICAL EXAMINATION: ECOG PERFORMANCE STATUS: 0 - Asymptomatic  Vitals:   10/12/23 1311  BP: (!) 155/82  Pulse: 74  Resp: 18  Temp: (!) 97.3 F (36.3 C)  SpO2: 100%   Filed Weights   10/12/23 1311  Weight: 153 lb (69.4 kg)    GENERAL:alert, no distress and comfortable    LABORATORY DATA:  I have reviewed the data as listed Lab Results  Component Value Date   WBC 6.3 12/17/2022   HGB 14.4 12/17/2022   HCT 42.1 12/17/2022   MCV 94.9 12/17/2022   PLT 243.0 12/17/2022   Lab Results  Component Value Date   NA 138 06/17/2023   K 4.2 06/17/2023   CL 106 06/17/2023   CO2 26 06/17/2023    RADIOGRAPHIC STUDIES: I have personally reviewed the radiological reports and agreed with the findings in the report.  ASSESSMENT AND  PLAN:  At high risk for breast cancer 1.  Breast cancer risk: Tyrer-Cuzick: 10-year risk: 6.5% (average risk 3.1%) Lifetime risk: 10.2% (average risk 4.9%)  2. Risk reduction strategies: We discussed different risk reducing strategies for future breast cancer risk. We discussed antiestrogen therapies and lifestyle modification.   3. Lifestyle modification: We discussed different interventions exercise at least 5 days a week including both aerobic as well as weight-bearing exercises and strength training. we discussed dietary modification including increasing number of servings of fruit and vegetables as well as decreased in number of servings of meat. We discussed the importance of maintaining a good BMI. Certainly patient eliminating or limiting alcohol consumption.  4. Surveillance: Patient certainly would be a good candidate for ongoing mammograms on a yearly basis.  I recommended obtaining contrast-enhanced mammograms annually.  We will set her up for the first contrast-enhanced mammogram in June.  I will request Dr. Glade Hope to continue the CEMs annually in June thereafter.(Code is IMG950 to order)   6. Genetics: Negative 7. Followup: On an as-needed basis.   All questions were answered. The patient knows to call the clinic with any problems, questions or concerns.    Viinay K Jenise Iannelli, MD 10/12/23

## 2023-10-12 NOTE — Assessment & Plan Note (Signed)
 1.  Breast cancer risk: Tyrer-Cuzick: 10-year risk: 6.5% (average risk 3.1%) Lifetime risk: 10.2% (average risk 4.9%)  2. Risk reduction strategies: We discussed different risk reducing strategies for future breast cancer risk. We discussed antiestrogen therapies and lifestyle modification.   3. Lifestyle modification: We discussed different interventions exercise at least 5 days a week including both aerobic as well as weight-bearing exercises and strength training. we discussed dietary modification including increasing number of servings of fruit and vegetables as well as decreased in number of servings of meat. We discussed the importance of maintaining a good BMI. Certainly patient eliminating or limiting alcohol consumption.  4. Surveillance: Patient certainly would be a good candidate for ongoing mammograms on a yearly basis.  I recommended obtaining contrast-enhanced mammograms annually.  We will set her up for the first contrast-enhanced mammogram in June.  I will request Dr. Glade Hope to continue the CEMs annually in June thereafter.(Code is IMG950 to order)   6. Genetics: Negative 7. Followup: On an as-needed basis.

## 2023-10-28 DIAGNOSIS — G4733 Obstructive sleep apnea (adult) (pediatric): Secondary | ICD-10-CM | POA: Diagnosis not present

## 2023-11-11 DIAGNOSIS — G4733 Obstructive sleep apnea (adult) (pediatric): Secondary | ICD-10-CM | POA: Diagnosis not present

## 2023-11-23 DIAGNOSIS — L821 Other seborrheic keratosis: Secondary | ICD-10-CM | POA: Diagnosis not present

## 2023-11-23 DIAGNOSIS — L218 Other seborrheic dermatitis: Secondary | ICD-10-CM | POA: Diagnosis not present

## 2023-11-23 DIAGNOSIS — Z85828 Personal history of other malignant neoplasm of skin: Secondary | ICD-10-CM | POA: Diagnosis not present

## 2023-11-23 DIAGNOSIS — D2271 Melanocytic nevi of right lower limb, including hip: Secondary | ICD-10-CM | POA: Diagnosis not present

## 2023-11-23 DIAGNOSIS — D225 Melanocytic nevi of trunk: Secondary | ICD-10-CM | POA: Diagnosis not present

## 2023-11-23 DIAGNOSIS — L814 Other melanin hyperpigmentation: Secondary | ICD-10-CM | POA: Diagnosis not present

## 2023-11-23 DIAGNOSIS — D1801 Hemangioma of skin and subcutaneous tissue: Secondary | ICD-10-CM | POA: Diagnosis not present

## 2023-12-08 ENCOUNTER — Other Ambulatory Visit: Payer: Self-pay | Admitting: Internal Medicine

## 2023-12-09 DIAGNOSIS — G4733 Obstructive sleep apnea (adult) (pediatric): Secondary | ICD-10-CM | POA: Diagnosis not present

## 2023-12-15 ENCOUNTER — Encounter: Payer: Self-pay | Admitting: Internal Medicine

## 2023-12-15 NOTE — Patient Instructions (Addendum)
      Blood work was ordered.       Medications changes include :   zpak      Return in about 6 months (around 06/17/2024) for Physical Exam.

## 2023-12-15 NOTE — Progress Notes (Unsigned)
      Subjective:    Patient ID: Christina Buchanan, female    DOB: July 20, 1956, 68 y.o.   MRN: [id number]     HPI Marlet is here for follow up of her chronic medical problems.  No inc mounjaro  Medications and allergies reviewed with patient and updated if appropriate.  Current Outpatient Medications on File Prior to Visit  Medication Sig Dispense Refill   aspirin EC 81 MG tablet Take 1 tablet (81 mg total) by mouth daily.     Azelastine HCl 137 MCG/SPRAY SOLN PLACE 1 SPRAY IN EACH NOSTRIL DAILY AS DIRECTED 30 mL 8   Biotin w/ Vitamins C & E (HAIR SKIN & NAILS GUMMIES PO) Take by mouth.     cetirizine (ZYRTEC) 10 MG tablet Take 10 mg by mouth daily as needed for allergies.      COLLAGEN PO Take by mouth. Gummy once a day     Eluxadoline (VIBERZI) 100 MG TABS TAKE 1 TABLET BY MOUTH 2 TIMES A DAY 60 tablet 5   escitalopram (LEXAPRO) 10 MG tablet TAKE 1 TABLET BY MOUTH DAILY 90 tablet 1   hydrocortisone (ANUSOL-HC) 2.5 % rectal cream PLACE 1 APPLICATION RECTALLY 2 (TWO) TIMES DAILY. 30 g 2   nystatin cream (MYCOSTATIN) Apply 1 Application topically 2 (two) times daily. 30 g 0   simvastatin (ZOCOR) 20 MG tablet TAKE 1 TABLET BY MOUTH DAILY AT 6 IN THE EVENING 90 tablet 3   tirzepatide (MOUNJARO) 10 MG/0.5ML Pen Inject 10 mg into the skin once a week. 6 mL 0   tirzepatide (MOUNJARO) 2.5 MG/0.5ML Pen Inject 2.5 mg into the skin once a week. 2 mL 0   tirzepatide (MOUNJARO) 5 MG/0.5ML Pen Inject 5 mg into the skin once a week. 6 mL 0   tirzepatide (MOUNJARO) 7.5 MG/0.5ML Pen Inject 7.5 mg into the skin once a week. 2 mL 2   triamcinolone (KENALOG) 0.025 % ointment Apply 1 Application topically 2 (two) times daily. Use for one to two weeks at a time. 30 g 1   No current facility-administered medications on file prior to visit.     Review of Systems     Objective:  There were no vitals filed for this visit. BP Readings from Last 3 Encounters:  10/12/23 (!) 155/82  06/17/23 130/76   12/17/22 122/74   Wt Readings from Last 3 Encounters:  10/12/23 153 lb (69.4 kg)  06/17/23 151 lb 12.8 oz (68.9 kg)  12/17/22 152 lb 12.8 oz (69.3 kg)   There is no height or weight on file to calculate BMI.    Physical Exam     Lab Results  Component Value Date   WBC 6.3 12/17/2022   HGB 14.4 12/17/2022   HCT 42.1 12/17/2022   PLT 243.0 12/17/2022   GLUCOSE 123 (H) 06/17/2023   CHOL 150 06/17/2023   TRIG 138.0 06/17/2023   HDL 47.70 06/17/2023   LDLDIRECT 153.6 12/12/2011   LDLCALC 75 06/17/2023   ALT 26 06/17/2023   AST 22 06/17/2023   NA 138 06/17/2023   K 4.2 06/17/2023   CL 106 06/17/2023   CREATININE 0.68 06/17/2023   BUN 18 06/17/2023   CO2 26 06/17/2023   TSH 1.56 12/17/2022   HGBA1C 6.3 06/17/2023   MICROALBUR 2.7 (H) 12/17/2022     Assessment & Plan:    See Problem List for Assessment and Plan of chronic medical problems.

## 2023-12-16 ENCOUNTER — Ambulatory Visit (INDEPENDENT_AMBULATORY_CARE_PROVIDER_SITE_OTHER): Payer: Medicare HMO | Admitting: Internal Medicine

## 2023-12-16 VITALS — BP 136/80 | HR 68 | Temp 98.0°F | Ht 63.0 in | Wt 139.0 lb

## 2023-12-16 DIAGNOSIS — E1169 Type 2 diabetes mellitus with other specified complication: Secondary | ICD-10-CM | POA: Diagnosis not present

## 2023-12-16 DIAGNOSIS — J0100 Acute maxillary sinusitis, unspecified: Secondary | ICD-10-CM

## 2023-12-16 DIAGNOSIS — Z860100 Personal history of colon polyps, unspecified: Secondary | ICD-10-CM

## 2023-12-16 DIAGNOSIS — K580 Irritable bowel syndrome with diarrhea: Secondary | ICD-10-CM | POA: Diagnosis not present

## 2023-12-16 DIAGNOSIS — Z7985 Long-term (current) use of injectable non-insulin antidiabetic drugs: Secondary | ICD-10-CM

## 2023-12-16 DIAGNOSIS — N951 Menopausal and female climacteric states: Secondary | ICD-10-CM | POA: Diagnosis not present

## 2023-12-16 DIAGNOSIS — E782 Mixed hyperlipidemia: Secondary | ICD-10-CM | POA: Diagnosis not present

## 2023-12-16 LAB — LIPID PANEL
Cholesterol: 148 mg/dL (ref 0–200)
HDL: 49.7 mg/dL (ref >39.00)
LDL Cholesterol: 84 mg/dL (ref 0–99)
NonHDL: 98.55
Total CHOL/HDL Ratio: 3
Triglycerides: 75 mg/dL (ref 0.0–149.0)
VLDL: 15 mg/dL (ref 0.0–40.0)

## 2023-12-16 LAB — COMPREHENSIVE METABOLIC PANEL
ALT: 21 U/L (ref 0–35)
AST: 23 U/L (ref 0–37)
Albumin: 4.7 g/dL (ref 3.5–5.2)
Alkaline Phosphatase: 56 U/L (ref 39–117)
BUN: 19 mg/dL (ref 6–23)
CO2: 28 meq/L (ref 19–32)
Calcium: 9.7 mg/dL (ref 8.4–10.5)
Chloride: 105 meq/L (ref 96–112)
Creatinine, Ser: 0.72 mg/dL (ref 0.40–1.20)
GFR: 85.64 mL/min (ref >60.00)
Glucose, Bld: 95 mg/dL (ref 70–99)
Potassium: 4.2 meq/L (ref 3.5–5.1)
Sodium: 139 meq/L (ref 135–145)
Total Bilirubin: 0.5 mg/dL (ref 0.2–1.2)
Total Protein: 7.5 g/dL (ref 6.0–8.3)

## 2023-12-16 LAB — HEMOGLOBIN A1C: Hgb A1c MFr Bld: 5.6 % (ref 4.6–6.5)

## 2023-12-16 LAB — MICROALBUMIN / CREATININE URINE RATIO
Creatinine,U: 194.6 mg/dL
Microalb Creat Ratio: 12.8 mg/g (ref 0.0–30.0)
Microalb, Ur: 2.5 mg/dL — ABNORMAL HIGH (ref 0.0–1.9)

## 2023-12-16 MED ORDER — HYDROCORTISONE (PERIANAL) 2.5 % EX CREA: TOPICAL_CREAM | Freq: Two times a day (BID) | CUTANEOUS | 2 refills | Status: AC | PRN

## 2023-12-16 MED ORDER — TIRZEPATIDE 10 MG/0.5ML ~~LOC~~ SOAJ: 10.0000 mg | SUBCUTANEOUS | 1 refills | Status: DC

## 2023-12-16 MED ORDER — AZITHROMYCIN 250 MG PO TABS: ORAL_TABLET | ORAL | 0 refills | Status: DC

## 2023-12-16 NOTE — Assessment & Plan Note (Addendum)
 Chronic With hyperlipidemia  Lab Results  Component Value Date   HGBA1C 6.3 06/17/2023   Sugars well controlled Check A1c, urine albumin/creatinine ratio Continue Mounjaro 10 mg weekly-I do not think she needs a higher dose and would recommend she stay on 10 mg Feels she is getting her protein in-stressed protein and exercise Stressed regular exercise, diabetic diet

## 2023-12-16 NOTE — Assessment & Plan Note (Addendum)
 Chronic Controlled, Stable Continue Viberzi 100 mg twice daily-may try taking this once a day since the [facility] is counteracting her diarrhea

## 2023-12-16 NOTE — Assessment & Plan Note (Signed)
 Chronic Controlled, Stable Continue Lexapro 10 mg daily

## 2023-12-16 NOTE — Assessment & Plan Note (Signed)
Chronic Check lipid panel, CMP Continue simvastatin 20 mg daily Regular exercise and healthy diet encouraged

## 2023-12-16 NOTE — Assessment & Plan Note (Signed)
 Acute Likely bacterial given duration and symptoms Start Z-Pak otc cold medications Rest, fluid Call if no improvement

## 2023-12-16 NOTE — Assessment & Plan Note (Signed)
 Overdue for colonoscopy Referral to Dr. Loreta Ave

## 2023-12-18 ENCOUNTER — Other Ambulatory Visit (HOSPITAL_COMMUNITY): Payer: Self-pay

## 2023-12-18 ENCOUNTER — Telehealth: Payer: Self-pay | Admitting: Pharmacy Technician

## 2023-12-18 NOTE — Telephone Encounter (Signed)
 Pharmacy Patient Advocate Encounter   Received notification from CoverMyMeds that prior authorization for [facility] 10MG  is required/requested.   Insurance verification completed.   The patient is insured through [facility] .   Per test claim: The current 84 day co-pay is, $3.40.  No PA needed at this time. This test claim was processed through [facility]- copay amounts may vary at other pharmacies due to pharmacy/plan contracts, or as the patient moves through the different stages of their insurance plan.

## 2023-12-19 ENCOUNTER — Encounter: Payer: Self-pay | Admitting: Internal Medicine

## 2024-01-04 ENCOUNTER — Other Ambulatory Visit: Payer: Self-pay | Admitting: Internal Medicine

## 2024-01-07 DIAGNOSIS — K5730 Diverticulosis of large intestine without perforation or abscess without bleeding: Secondary | ICD-10-CM | POA: Diagnosis not present

## 2024-01-07 DIAGNOSIS — E119 Type 2 diabetes mellitus without complications: Secondary | ICD-10-CM | POA: Diagnosis not present

## 2024-01-07 DIAGNOSIS — E785 Hyperlipidemia, unspecified: Secondary | ICD-10-CM | POA: Diagnosis not present

## 2024-01-07 DIAGNOSIS — Z1211 Encounter for screening for malignant neoplasm of colon: Secondary | ICD-10-CM | POA: Diagnosis not present

## 2024-01-07 DIAGNOSIS — Z860100 Personal history of colon polyps, unspecified: Secondary | ICD-10-CM | POA: Diagnosis not present

## 2024-01-09 DIAGNOSIS — G4733 Obstructive sleep apnea (adult) (pediatric): Secondary | ICD-10-CM | POA: Diagnosis not present

## 2024-01-29 DIAGNOSIS — G4733 Obstructive sleep apnea (adult) (pediatric): Secondary | ICD-10-CM | POA: Diagnosis not present

## 2024-02-08 DIAGNOSIS — G4733 Obstructive sleep apnea (adult) (pediatric): Secondary | ICD-10-CM | POA: Diagnosis not present

## 2024-02-18 ENCOUNTER — Other Ambulatory Visit: Payer: Self-pay | Admitting: Internal Medicine

## 2024-02-26 DIAGNOSIS — Z1211 Encounter for screening for malignant neoplasm of colon: Secondary | ICD-10-CM | POA: Diagnosis not present

## 2024-02-26 DIAGNOSIS — Z980 Intestinal bypass and anastomosis status: Secondary | ICD-10-CM | POA: Diagnosis not present

## 2024-02-26 DIAGNOSIS — K635 Polyp of colon: Secondary | ICD-10-CM | POA: Diagnosis not present

## 2024-02-26 DIAGNOSIS — Z860100 Personal history of colon polyps, unspecified: Secondary | ICD-10-CM | POA: Diagnosis not present

## 2024-02-26 DIAGNOSIS — K5730 Diverticulosis of large intestine without perforation or abscess without bleeding: Secondary | ICD-10-CM | POA: Diagnosis not present

## 2024-03-10 DIAGNOSIS — G4733 Obstructive sleep apnea (adult) (pediatric): Secondary | ICD-10-CM | POA: Diagnosis not present

## 2024-04-01 ENCOUNTER — Other Ambulatory Visit: Payer: Self-pay | Admitting: Internal Medicine

## 2024-04-09 DIAGNOSIS — G4733 Obstructive sleep apnea (adult) (pediatric): Secondary | ICD-10-CM | POA: Diagnosis not present

## 2024-04-27 ENCOUNTER — Other Ambulatory Visit: Payer: Self-pay | Admitting: Internal Medicine

## 2024-04-27 MED ORDER — VIBERZI 100 MG PO TABS: 1.0000 | ORAL_TABLET | Freq: Two times a day (BID) | ORAL | 5 refills | Status: DC

## 2024-04-27 NOTE — Telephone Encounter (Unsigned)
 Copied from CRM [id number]. Topic: Clinical - Medication Refill >> Apr 27, 2024  9:54 AM Laymon HERO wrote: Medication: Eluxadoline  (VIBERZI ) 100 MG TABS  Has the patient contacted their pharmacy? Yes (Agent: If no, request that the patient contact the pharmacy for the refill. If patient does not wish to contact the pharmacy document the reason why and proceed with request.) (Agent: If yes, when and what did the pharmacy advise?)  This is the patient's preferred pharmacy:  [facility] [id number] - [place], [place] - [address] [place] [address] Phone: [phone] Fax: [phone]    Is this the correct pharmacy for this prescription? Yes If no, delete pharmacy and type the correct one.   Has the prescription been filled recently? Yes  Is the patient out of the medication? Yes  Has the patient been seen for an appointment in the last year OR does the patient have an upcoming appointment? Yes  Can we respond through MyChart? Yes  Agent: Please be advised that Rx refills may take up to 3 business days. We ask that you follow-up with your pharmacy.

## 2024-04-30 DIAGNOSIS — G4733 Obstructive sleep apnea (adult) (pediatric): Secondary | ICD-10-CM | POA: Diagnosis not present

## 2024-05-10 DIAGNOSIS — G4733 Obstructive sleep apnea (adult) (pediatric): Secondary | ICD-10-CM | POA: Diagnosis not present

## 2024-06-10 DIAGNOSIS — G4733 Obstructive sleep apnea (adult) (pediatric): Secondary | ICD-10-CM | POA: Diagnosis not present

## 2024-06-20 ENCOUNTER — Ambulatory Visit: Admitting: Internal Medicine

## 2024-06-22 ENCOUNTER — Encounter: Admitting: Internal Medicine

## 2024-06-26 ENCOUNTER — Other Ambulatory Visit: Payer: Self-pay | Admitting: Internal Medicine

## 2024-06-30 ENCOUNTER — Ambulatory Visit (INDEPENDENT_AMBULATORY_CARE_PROVIDER_SITE_OTHER)

## 2024-06-30 ENCOUNTER — Encounter: Payer: Self-pay | Admitting: Internal Medicine

## 2024-06-30 VITALS — Ht 63.0 in | Wt 139.0 lb

## 2024-06-30 DIAGNOSIS — Z0000 Encounter for general adult medical examination without abnormal findings: Secondary | ICD-10-CM

## 2024-06-30 NOTE — Patient Instructions (Addendum)
 Blood work was ordered.       Medications changes include :   None    A referral was ordered and someone will call you to schedule an appointment.     Return in about 6 months (around 12/29/2024) for follow up.    Health Maintenance, Female Adopting a healthy lifestyle and getting preventive care are important in promoting health and wellness. Ask your health care provider about: The right schedule for you to have regular tests and exams. Things you can do on your own to prevent diseases and keep yourself healthy. What should I know about diet, weight, and exercise? Eat a healthy diet  Eat a diet that includes plenty of vegetables, fruits, low-fat dairy products, and lean protein. Do not eat a lot of foods that are high in solid fats, added sugars, or sodium. Maintain a healthy weight Body mass index (BMI) is used to identify weight problems. It estimates body fat based on height and weight. Your health care provider can help determine your BMI and help you achieve or maintain a healthy weight. Get regular exercise Get regular exercise. This is one of the most important things you can do for your health. Most adults should: Exercise for at least 150 minutes each week. The exercise should increase your heart rate and make you sweat (moderate-intensity exercise). Do strengthening exercises at least twice a week. This is in addition to the moderate-intensity exercise. Spend less time sitting. Even light physical activity can be beneficial. Watch cholesterol and blood lipids Have your blood tested for lipids and cholesterol at 69 years of age, then have this test every 5 years. Have your cholesterol levels checked more often if: Your lipid or cholesterol levels are high. You are older than 69 years of age. You are at high risk for heart disease. What should I know about cancer screening? Depending on your health history and family history, you may need to have cancer  screening at various ages. This may include screening for: Breast cancer. Cervical cancer. Colorectal cancer. Skin cancer. Lung cancer. What should I know about heart disease, diabetes, and high blood pressure? Blood pressure and heart disease High blood pressure causes heart disease and increases the risk of stroke. This is more likely to develop in people who have high blood pressure readings or are overweight. Have your blood pressure checked: Every 3-5 years if you are 13-70 years of age. Every year if you are 60 years old or older. Diabetes Have regular diabetes screenings. This checks your fasting blood sugar level. Have the screening done: Once every three years after age 68 if you are at a normal weight and have a low risk for diabetes. More often and at a younger age if you are overweight or have a high risk for diabetes. What should I know about preventing infection? Hepatitis B If you have a higher risk for hepatitis B, you should be screened for this virus. Talk with your health care provider to find out if you are at risk for hepatitis B infection. Hepatitis C Testing is recommended for: Everyone born from [birth year] through 1966. Anyone with known risk factors for hepatitis C. Sexually transmitted infections (STIs) Get screened for STIs, including gonorrhea and chlamydia, if: You are sexually active and are younger than 69 years of age. You are older than 68 years of age and your health care provider tells you that you are at risk for this type of infection. Your sexual activity  has changed since you were last screened, and you are at increased risk for chlamydia or gonorrhea. Ask your health care provider if you are at risk. Ask your health care provider about whether you are at high risk for HIV. Your health care provider may recommend a prescription medicine to help prevent HIV infection. If you choose to take medicine to prevent HIV, you should first get tested for HIV. You  should then be tested every 3 months for as long as you are taking the medicine. Pregnancy If you are about to stop having your period (premenopausal) and you may become pregnant, seek counseling before you get pregnant. Take 400 to 800 micrograms (mcg) of folic acid every day if you become pregnant. Ask for birth control (contraception) if you want to prevent pregnancy. Osteoporosis and menopause Osteoporosis is a disease in which the bones lose minerals and strength with aging. This can result in bone fractures. If you are 42 years old or older, or if you are at risk for osteoporosis and fractures, ask your health care provider if you should: Be screened for bone loss. Take a calcium or vitamin D  supplement to lower your risk of fractures. Be given hormone replacement therapy (HRT) to treat symptoms of menopause. Follow these instructions at home: Alcohol use Do not drink alcohol if: Your health care provider tells you not to drink. You are pregnant, may be pregnant, or are planning to become pregnant. If you drink alcohol: Limit how much you have to: 0-1 drink a day. Know how much alcohol is in your drink. In the [place] one drink equals one 12 oz bottle of beer (355 mL), one 5 oz glass of wine (148 mL), or one 1 oz glass of hard liquor (44 mL). Lifestyle Do not use any products that contain nicotine or tobacco. These products include cigarettes, chewing tobacco, and vaping devices, such as e-cigarettes. If you need help quitting, ask your health care provider. Do not use street drugs. Do not share needles. Ask your health care provider for help if you need support or information about quitting drugs. General instructions Schedule regular health, dental, and eye exams. Stay current with your vaccines. Tell your health care provider if: You often feel depressed. You have ever been abused or do not feel safe at home. Summary Adopting a healthy lifestyle and getting preventive care are  important in promoting health and wellness. Follow your health care provider's instructions about healthy diet, exercising, and getting tested or screened for diseases. Follow your health care provider's instructions on monitoring your cholesterol and blood pressure. This information is not intended to replace advice given to you by your health care provider. Make sure you discuss any questions you have with your health care provider. Document Revised: 02/11/2021 Document Reviewed: 02/11/2021 Elsevier Patient Education  2024 [employer].

## 2024-06-30 NOTE — Patient Instructions (Signed)
 Christina Buchanan,  Thank you for taking the time for your Medicare Wellness Visit. I appreciate your continued commitment to your health goals. Please review the care plan we discussed, and feel free to reach out if I can assist you further.  Medicare recommends these wellness visits once per year to help you and your care team stay ahead of potential health issues. These visits are designed to focus on prevention, allowing your provider to concentrate on managing your acute and chronic conditions during your regular appointments.  Please note that Annual Wellness Visits do not include a physical exam. Some assessments may be limited, especially if the visit was conducted virtually. If needed, we may recommend a separate in-person follow-up with your provider.  Ongoing Care Seeing your primary care provider every 3 to 6 months helps us  monitor your health and provide consistent, personalized care. Next office visit on 07/01/2024.  You are due for a diabetic eye exam.    Referrals If a referral was made during today's visit and you haven't received any updates within two weeks, please contact the referred provider directly to check on the status.  Recommended Screenings:  Health Maintenance  Topic Date Due   Medicare Annual Wellness Visit  Never done   Eye exam for diabetics  09/05/2022   Complete foot exam   12/06/2022   Flu Shot  05/06/2024   Hemoglobin A1C  06/17/2024   DTaP/Tdap/Td vaccine (3 - Td or Tdap) 12/15/2024*   Breast Cancer Screening  08/31/2024   Yearly kidney function blood test for diabetes  12/15/2024   Yearly kidney health urinalysis for diabetes  12/15/2024   DEXA scan (bone density measurement)  09/23/2025   Colon Cancer Screening  02/25/2029   Hepatitis C Screening  Completed   HPV Vaccine  Aged Out   Meningitis B Vaccine  Aged Out   Pneumococcal Vaccine for age over 57  Discontinued   COVID-19 Vaccine  Discontinued   Zoster (Shingles) Vaccine  Discontinued  *Topic was  postponed. The date shown is not the original due date.       06/30/2024   11:33 AM  Advanced Directives  Does Patient Have a Medical Advance Directive? Yes  Type of Estate agent of [place];Living will  Copy of Healthcare Power of Attorney in Chart? No - copy requested   Advance Care Planning is important because it: Ensures you receive medical care that aligns with your values, goals, and preferences. Provides guidance to your family and loved ones, reducing the emotional burden of decision-making during critical moments.  Vision: Annual vision screenings are recommended for early detection of glaucoma, cataracts, and diabetic retinopathy. These exams can also reveal signs of chronic conditions such as diabetes and high blood pressure.  Dental: Annual dental screenings help detect early signs of oral cancer, gum disease, and other conditions linked to overall health, including heart disease and diabetes.  Please see the attached documents for additional preventive care recommendations.

## 2024-06-30 NOTE — Progress Notes (Signed)
 Subjective:   Christina Buchanan is a 68 y.o. who presents for a Medicare Wellness preventive visit.  As a reminder, Annual Wellness Visits don't include a physical exam, and some assessments may be limited, especially if this visit is performed virtually. We may recommend an in-person follow-up visit with your provider if needed.  Visit Complete: Virtual I connected with  Christina Buchanan on 06/30/24 by a audio enabled telemedicine application and verified that I am speaking with the correct person using two identifiers.  Patient Location: Home  Provider Location: Home Office  I discussed the limitations of evaluation and management by telemedicine. The patient expressed understanding and agreed to proceed.  Vital Signs: Because this visit was a virtual/telehealth visit, some criteria may be missing or patient reported. Any vitals not documented were not able to be obtained and vitals that have been documented are patient reported.  VideoDeclined- This patient declined Librarian, academic. Therefore the visit was completed with audio only.  Persons Participating in Visit: Patient.  AWV Questionnaire: No: Patient Medicare AWV questionnaire was not completed prior to this visit.  Cardiac Risk Factors include: advanced age (>82men, >27 women);diabetes mellitus;Other (see comment);dyslipidemia, Risk factor comments: OSA , Aortic atherosclerosis     Objective:    Today's Vitals   06/30/24 1107  Weight: 193 lb (87.5 kg)  Height: 5' 3 (1.6 m)   Body mass index is 34.19 kg/m.     06/30/2024   11:33 AM 09/21/2020    3:59 PM 09/08/2019    6:37 AM 08/29/2019   10:57 AM 10/02/2016    3:12 PM 10/02/2016    6:25 AM 09/22/2016   11:09 AM  Advanced Directives  Does Patient Have a Medical Advance Directive? Yes No Yes Yes Yes  Yes  Yes   Type of Estate agent of [place];Living will   Healthcare Power of [place];Living will  Healthcare Power of [place];Living will  Healthcare Power of [place];Living will  Does patient want to make changes to medical advance directive?   No - Patient declined No - Patient declined No - Patient declined     Copy of Healthcare Power of Attorney in Chart? No - copy requested    No - copy requested  No - copy requested  No - copy requested      Data saved with a previous flowsheet row definition    Current Medications (verified) Outpatient Encounter Medications as of 06/30/2024  Medication Sig   aspirin  EC 81 MG tablet Take 1 tablet (81 mg total) by mouth daily.   Azelastine  HCl 137 MCG/SPRAY SOLN PLACE 1 SPRAY IN EACH NOSTRIL DAILY AS DIRECTED   Biotin w/ Vitamins C & E (HAIR SKIN & NAILS GUMMIES PO) Take by mouth.   cetirizine (ZYRTEC) 10 MG tablet Take 10 mg by mouth daily as needed for allergies.    COLLAGEN PO Take by mouth. Gummy once a day   Eluxadoline  (VIBERZI ) 100 MG TABS Take 1 tablet (100 mg total) by mouth 2 (two) times daily.   escitalopram  (LEXAPRO ) 10 MG tablet TAKE 1 TABLET BY MOUTH DAILY   hydrocortisone  (ANUSOL -HC) 2.5 % rectal cream Place rectally 2 (two) times daily as needed for hemorrhoids or anal itching.   MOUNJARO  10 MG/0.5ML Pen INJECT 10 MG UNDER THE SKIN ONCE WEEKLY   nystatin  cream (MYCOSTATIN ) Apply 1 Application topically 2 (two) times daily.   simvastatin  (ZOCOR ) 20 MG tablet TAKE ONE TABLET BY MOUTH EVERY EVENING AT 6  triamcinolone  (KENALOG ) 0.025 % ointment Apply 1 Application topically 2 (two) times daily. Use for one to two weeks at a time.   No facility-administered encounter medications on file as of 06/30/2024.    Allergies (verified) Penicillins and Metformin  and related   History: Past Medical History:  Diagnosis Date   ALLERGIC RHINITIS    Arthritis    hands   DEPRESSION    Diabetes mellitus without complication (HCC) 2003   diet controlled   Family history of breast cancer    Fibroid, uterine 04/29/2016   HYPERLIPIDEMIA     OSA (obstructive sleep apnea) 12/15/2017   Osteopenia    Personal history of colonic polyps    SMOKER    VITAMIN D  DEFICIENCY    Past Surgical History:  Procedure Laterality Date   ABDOMINAL HYSTERECTOMY N/A 10/02/2016   Procedure: HYSTERECTOMY ABDOMINAL;  Surgeon: Bobie FORBES Cathlyn JAYSON Nikki, MD;  Location: [facility];  Service: Gynecology;  Laterality: N/A;  4 hours   ABDOMINAL SACROCOLPOPEXY N/A 10/02/2016   Procedure: ABDOMINO SACROCOLPOPEXY;  Surgeon: Bobie FORBES Cathlyn JAYSON Nikki, MD;  Location: [facility];  Service: Gynecology;  Laterality: N/A;   ANTERIOR AND POSTERIOR REPAIR N/A 10/02/2016   Procedure: ANTERIOR (CYSTOCELE) AND POSTERIOR REPAIR (RECTOCELE);  Surgeon: Bobie FORBES Cathlyn JAYSON Nikki, MD;  Location: [facility];  Service: Gynecology;  Laterality: N/A;   BLADDER SUSPENSION N/A 10/02/2016   Procedure: TRANSVAGINAL TAPE (TVT) PROCEDURE exact midurethral sling;  Surgeon: Bobie FORBES Cathlyn JAYSON Nikki, MD;  Location: [facility];  Service: Gynecology;  Laterality: N/A;   BREAST EXCISIONAL BIOPSY Left 09/2019   BREAST LUMPECTOMY WITH RADIOACTIVE SEED LOCALIZATION Left 09/08/2019   Procedure: LEFT BREAST LUMPECTOMY WITH RADIOACTIVE SEED LOCALIZATION;  Surgeon: Vernetta Berg, MD;  Location: [facility];  Service: General;  Laterality: Left;   BREAST SURGERY  10/06/1988   breast biopsy   COLONOSCOPY W/ BIOPSIES  06/06/2006   2 polyps & tubuvillous adenoma   COLONOSCOPY W/ BIOPSIES  11/13/2008   sigmoid ddiverticuli and rechek in 5 years - Dr. 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   SALPINGOOPHORECTOMY Bilateral 10/02/2016   Procedure: SALPINGO OOPHORECTOMY;  Surgeon: Bobie FORBES Cathlyn JAYSON Nikki, MD;  Location: [facility];  Service:  Gynecology;  Laterality: Bilateral;   UPPER GASTROINTESTINAL ENDOSCOPY  06/15/2006   2 polyps - H pylori   Family History  Problem Relation Age of Onset   Breast cancer Mother 59   Diabetes Mother    Stroke Mother                Hyperlipidemia Mother    Heart failure Mother    Hypertension Mother    COPD Mother    Hypertension Father    Heart disease Father    Hyperlipidemia Father    Heart failure Father        dec at age 67   Breast cancer Sister 50   Heart disease Sister        Afib   Psoriasis Son    Social History   Socioeconomic History   Marital status: Married    Spouse name: Maude   Number of children: 3   Years of education: Not on file   Highest  education level: 12th grade  Occupational History   Occupation: Full time  Tobacco Use   Smoking status: Every Day    Current packs/day: 0.30    Average packs/day: 0.3 packs/day for 30.0 years (9.0 ttl pk-yrs)    Types: Cigarettes   Smokeless tobacco: Never   Tobacco comments:    pt smokes 5 cigarettes per day. Married, lives with spouse. Cares for elderly parent and in-laws  Vaping Use   Vaping status: Never Used  Substance and Sexual Activity   Alcohol use: Yes    Alcohol/week: 2.0 standard drinks of alcohol    Types: 2 Standard drinks or equivalent per week    Comment: Rare glass of wine every 2-3 weeks   Drug use: No   Sexual activity: Yes    Partners: Male    Birth control/protection: Other-see comments, Post-menopausal    Comment: husband had vasectomy  Other Topics Concern   Not on file  Social History Narrative   Lives with husband in a one story home./2025 with pets  She has 3 grown children.     Owns a Surveyor, quantity.     Education: high school.        No regular exercise   Social Drivers of Health   Financial Resource Strain: Low Risk  (06/30/2024)   Overall Financial Resource Strain (CARDIA)    Difficulty of Paying Living Expenses: Not hard at all  Food Insecurity: No Food  Insecurity (06/30/2024)   Hunger Vital Sign    Worried About Running Out of Food in the Last Year: Never true    Ran Out of Food in the Last Year: Never true  Transportation Needs: No Transportation Needs (06/30/2024)   PRAPARE - Administrator, Civil Service (Medical): No    Lack of Transportation (Non-Medical): No  Physical Activity: Sufficiently Active (06/30/2024)   Exercise Vital Sign    Days of Exercise per Week: 7 days    Minutes of Exercise per Session: 30 min  Stress: No Stress Concern Present (06/30/2024)   Christina Buchanan of Occupational Health - Occupational Stress Questionnaire    Feeling of Stress: Not at all  Social Connections: Socially Integrated (06/30/2024)   Social Connection and Isolation Panel    Frequency of Communication with Friends and Family: More than three times a week    Frequency of Social Gatherings with Friends and Family: More than three times a week    Attends Religious Services: More than 4 times per year    Active Member of [employer] or Organizations: Yes    Attends Engineer, structural: More than 4 times per year    Marital Status: Married    Tobacco Counseling Ready to quit: Not Answered Counseling given: Not Answered Tobacco comments: pt smokes 5 cigarettes per day. Married, lives with spouse. Cares for elderly parent and in-laws    Clinical Intake:  Pre-visit preparation completed: Yes  Pain : No/denies pain     BMI - recorded: 34.19 Nutritional Status: BMI > 30  Obese Nutritional Risks: None Diabetes: Yes CBG done?: No Did pt. bring in CBG monitor from home?: No  Lab Results  Component Value Date   HGBA1C 5.6 12/16/2023   HGBA1C 6.3 06/17/2023   HGBA1C 6.1 12/17/2022     How often do you need to have someone help you when you read instructions, pamphlets, or other written materials from your doctor or pharmacy?: 1 - Never  Interpreter Needed?: No  Information entered by ::  Chrys Landgrebe,  RMA   Activities of Daily Living     06/30/2024   11:08 AM  In your present state of health, do you have any difficulty performing the following activities:  Hearing? 0  Vision? 0  Difficulty concentrating or making decisions? 0  Walking or climbing stairs? 0  Dressing or bathing? 0  Doing errands, shopping? 0  Preparing Food and eating ? N  Using the Toilet? N  In the past six months, have you accidently leaked urine? N  Do you have problems with loss of bowel control? N  Managing your Medications? N  Managing your Finances? N  Housekeeping or managing your Housekeeping? N    Patient Care Team: Geofm Glade PARAS, MD as PCP - General (Internal Medicine) Romine, Montie SQUIBB, MD (Obstetrics and Gynecology) Kristie Lamprey, MD (Gastroenterology) [facility] [place], [place]  I have updated your Care Teams any recent Medical Services you may have received from other providers in the past year.     Assessment:   This is a routine wellness examination for Christina Buchanan.  Hearing/Vision screen Hearing Screening - Comments:: Denies hearing difficulties   Vision Screening - Comments:: Wears eyeglasses/[facility]   Goals Addressed               This Visit's Progress     Patient Stated (pt-stated)        Not at this time/2025       Depression Screen     06/30/2024   11:36 AM 12/16/2023   11:01 AM 12/17/2022    9:24 AM 06/12/2022   10:49 AM 12/05/2021   10:05 AM 11/06/2020    4:20 PM 04/11/2019    8:52 AM  PHQ 2/9 Scores  PHQ - 2 Score 0 0 0 0 0 0 0  PHQ- 9 Score 0  0   0     Fall Risk     06/30/2024   11:34 AM 12/16/2023   11:00 AM 12/17/2022    9:24 AM 06/12/2022   11:21 AM 06/12/2022   10:49 AM  Fall Risk   Falls in the past year? 0 0 0 0 0  Number falls in past yr: 0 0 0 0 0  Injury with Fall? 0 0 0 0 0  Risk for fall due to :  No Fall Risks No Fall Risks Other (Comment) No Fall Risks  Follow up Falls evaluation completed;Falls prevention discussed Falls evaluation  completed Falls evaluation completed Falls evaluation completed  Falls evaluation completed      Data saved with a previous flowsheet row definition    MEDICARE RISK AT HOME:  Medicare Risk at Home Any stairs in or around the home?: Yes If so, are there any without handrails?: No Home free of loose throw rugs in walkways, pet beds, electrical cords, etc?: Yes Adequate lighting in your home to reduce risk of falls?: Yes Life alert?: No Use of a cane, walker or w/c?: No Grab bars in the bathroom?: No Shower chair or bench in shower?: Yes Elevated toilet seat or a handicapped toilet?: Yes  TIMED UP AND GO:  Was the test performed?  No  Cognitive Function: Declined/Normal: No cognitive concerns noted by patient or family. Patient alert, oriented, able to answer questions appropriately and recall recent events. No signs of memory loss or confusion.        Immunizations Immunization History  Administered Date(s) Administered   Influenza,inj,Quad PF,6+ Mos 09/08/2017, 10/08/2018, 08/06/2019   PFIZER Comirnaty(Gray Top)Covid-19  Tri-Sucrose Vaccine 12/14/2019, 01/09/2020   Td 10/06/2002   Tdap 02/17/2012    Screening Tests Health Maintenance  Topic Date Due   OPHTHALMOLOGY EXAM  09/05/2022   FOOT EXAM  12/06/2022   Influenza Vaccine  05/06/2024   HEMOGLOBIN A1C  06/17/2024   DTaP/Tdap/Td (3 - Td or Tdap) 12/15/2024 (Originally 02/16/2022)   Mammogram  08/31/2024   Diabetic kidney evaluation - eGFR measurement  12/15/2024   Diabetic kidney evaluation - Urine ACR  12/15/2024   Medicare Annual Wellness (AWV)  06/30/2025   DEXA SCAN  09/23/2025   Colonoscopy  02/25/2029   Hepatitis C Screening  Completed   HPV VACCINES  Aged Out   Meningococcal B Vaccine  Aged Out   Pneumococcal Vaccine: 50+ Years  Discontinued   COVID-19 Vaccine  Discontinued   Zoster Vaccines- Shingrix  Discontinued    Health Maintenance Items Addressed: See Nurse Notes at the end of this  note  Additional Screening:  Vision Screening: Recommended annual ophthalmology exams for early detection of glaucoma and other disorders of the eye. Is the patient up to date with their annual eye exam?  No  Who is the provider or what is the name of the office in which the patient attends annual eye exams? [facility]   Dental Screening: Recommended annual dental exams for proper oral hygiene  Community Resource Referral / Chronic Care Management: CRR required this visit?  No   CCM required this visit?  No   Plan:    I have personally reviewed and noted the following in the patient's chart:   Medical and social history Use of alcohol, tobacco or illicit drugs  Current medications and supplements including opioid prescriptions. Patient is not currently taking opioid prescriptions. Functional ability and status Nutritional status Physical activity Advanced directives List of other physicians Hospitalizations, surgeries, and ER visits in previous 12 months Vitals Screenings to include cognitive, depression, and falls Referrals and appointments  In addition, I have reviewed and discussed with patient certain preventive protocols, quality metrics, and best practice recommendations. A written personalized care plan for preventive services as well as general preventive health recommendations were provided to patient.   Dalisha Shively L Sutter Ahlgren, CMA   06/30/2024   After Visit Summary: (MyChart) Due to this being a telephonic visit, the after visit summary with patients personalized plan was offered to patient via MyChart   Notes: Patient is due for a diabetic eye exam and stated that she will soon schedule her appointment.  Patient had no other concerns to address today.

## 2024-06-30 NOTE — Progress Notes (Unsigned)
 Subjective:    Patient ID: Christina Buchanan, female    DOB: [birth year], 68 y.o.   MRN: [id number]      HPI Christina Buchanan is here for a Physical exam and her chronic medical problems.    Doing well - no concerns.    Medications and allergies reviewed with patient and updated if appropriate.  Current Outpatient Medications on File Prior to Visit  Medication Sig Dispense Refill   aspirin  EC 81 MG tablet Take 1 tablet (81 mg total) by mouth daily.     Azelastine  HCl 137 MCG/SPRAY SOLN PLACE 1 SPRAY IN EACH NOSTRIL DAILY AS DIRECTED 30 mL 8   Biotin w/ Vitamins C & E (HAIR SKIN & NAILS GUMMIES PO) Take by mouth.     cetirizine (ZYRTEC) 10 MG tablet Take 10 mg by mouth daily as needed for allergies.      COLLAGEN PO Take by mouth. Gummy once a day     Eluxadoline  (VIBERZI ) 100 MG TABS Take 1 tablet (100 mg total) by mouth 2 (two) times daily. 60 tablet 5   escitalopram  (LEXAPRO ) 10 MG tablet TAKE 1 TABLET BY MOUTH DAILY 90 tablet 1   hydrocortisone  (ANUSOL -HC) 2.5 % rectal cream Place rectally 2 (two) times daily as needed for hemorrhoids or anal itching. 30 g 2   MOUNJARO  10 MG/0.5ML Pen INJECT 10 MG UNDER THE SKIN ONCE WEEKLY 6 mL 1   nystatin  cream (MYCOSTATIN ) Apply 1 Application topically 2 (two) times daily. 30 g 0   simvastatin  (ZOCOR ) 20 MG tablet TAKE ONE TABLET BY MOUTH EVERY EVENING AT 6 90 tablet 3   tacrolimus (PROTOPIC) 0.1 % ointment Apply 1 Application topically 2 (two) times daily.     triamcinolone  (KENALOG ) 0.025 % ointment Apply 1 Application topically 2 (two) times daily. Use for one to two weeks at a time. 30 g 1   No current facility-administered medications on file prior to visit.    Review of Systems  Constitutional:  Negative for fever.  Eyes:  Negative for visual disturbance.  Respiratory:  Negative for cough, shortness of breath and wheezing.   Cardiovascular:  Negative for chest pain, palpitations and leg swelling.  Gastrointestinal:  Negative for  abdominal pain, blood in stool, constipation and diarrhea.       Occ gerd  Genitourinary:  Negative for dysuria.  Musculoskeletal:  Negative for arthralgias and back pain.  Skin:  Negative for rash.  Neurological:  Negative for dizziness, light-headedness, numbness and headaches.  Psychiatric/Behavioral:  Negative for dysphoric mood and sleep disturbance. The patient is not nervous/anxious.        Objective:   Vitals:   07/01/24 0914  BP: 110/70  Pulse: 80  Temp: 98 F (36.7 C)  SpO2: 96%   Filed Weights   07/01/24 0914  Weight: 140 lb (63.5 kg)   Body mass index is 24.8 kg/m.  BP Readings from Last 3 Encounters:  07/01/24 110/70  12/16/23 136/80  10/12/23 (!) 155/82    Wt Readings from Last 3 Encounters:  07/01/24 140 lb (63.5 kg)  06/30/24 139 lb (63 kg)  12/16/23 139 lb (63 kg)       Physical Exam Constitutional: She appears well-developed and well-nourished. No distress.  HENT:  Head: Normocephalic and atraumatic.  Right Ear: External ear normal. Normal ear canal and TM Left Ear: External ear normal.  Normal ear canal and TM Mouth/Throat: Oropharynx is clear and moist.  Eyes: Conjunctivae normal.  Neck: Neck supple. No  tracheal deviation present. No thyromegaly present.  No carotid bruit  Cardiovascular: Normal rate, regular rhythm and normal heart sounds.   No murmur heard.  No edema. Pulmonary/Chest: Effort normal and breath sounds normal. No respiratory distress. She has no wheezes. She has no rales.  Breast: deferred   Abdominal: Soft. She exhibits no distension. There is no tenderness.  Lymphadenopathy: She has no cervical adenopathy.  Skin: Skin is warm and dry. She is not diaphoretic.  Psychiatric: She has a normal mood and affect. Her behavior is normal.   Diabetic Foot Exam - Simple   Simple Foot Form Diabetic Foot exam was performed with the following findings: Yes 07/01/2024  9:44 AM  Visual Inspection No deformities, no ulcerations, no  other skin breakdown bilaterally: Yes Sensation Testing Intact to touch and monofilament testing bilaterally: Yes Pulse Check Posterior Tibialis and Dorsalis pulse intact bilaterally: Yes Comments      Lab Results  Component Value Date   WBC 6.3 12/17/2022   HGB 14.4 12/17/2022   HCT 42.1 12/17/2022   PLT 243.0 12/17/2022   GLUCOSE 95 12/16/2023   CHOL 148 12/16/2023   TRIG 75.0 12/16/2023   HDL 49.70 12/16/2023   LDLDIRECT 153.6 12/12/2011   LDLCALC 84 12/16/2023   ALT 21 12/16/2023   AST 23 12/16/2023   NA 139 12/16/2023   K 4.2 12/16/2023   CL 105 12/16/2023   CREATININE 0.72 12/16/2023   BUN 19 12/16/2023   CO2 28 12/16/2023   TSH 1.56 12/17/2022   HGBA1C 5.6 12/16/2023   MICROALBUR 2.5 (H) 12/16/2023         Assessment & Plan:   Physical exam: Screening blood work  ordered Exercise  walking about 3 days a week Weight  normal Substance abuse  none   Reviewed recommended immunizations.   Health Maintenance  Topic Date Due   OPHTHALMOLOGY EXAM  09/05/2022   FOOT EXAM  12/06/2022   HEMOGLOBIN A1C  06/17/2024   Mammogram  08/31/2024   DTaP/Tdap/Td (3 - Td or Tdap) 12/15/2024 (Originally 02/16/2022)   Influenza Vaccine  01/03/2025 (Originally 05/06/2024)   DEXA SCAN  09/23/2024   Diabetic kidney evaluation - eGFR measurement  12/15/2024   Diabetic kidney evaluation - Urine ACR  12/15/2024   Medicare Annual Wellness (AWV)  06/30/2025   Colonoscopy  02/25/2029   Hepatitis C Screening  Completed   HPV VACCINES  Aged Out   Meningococcal B Vaccine  Aged Out   Pneumococcal Vaccine: 50+ Years  Discontinued   COVID-19 Vaccine  Discontinued   Zoster Vaccines- Shingrix  Discontinued          See Problem List for Assessment and Plan of chronic medical problems.

## 2024-07-01 ENCOUNTER — Other Ambulatory Visit (HOSPITAL_COMMUNITY): Payer: Self-pay

## 2024-07-01 ENCOUNTER — Telehealth: Payer: Self-pay

## 2024-07-01 ENCOUNTER — Ambulatory Visit (INDEPENDENT_AMBULATORY_CARE_PROVIDER_SITE_OTHER): Admitting: Internal Medicine

## 2024-07-01 VITALS — BP 110/70 | HR 80 | Temp 98.0°F | Ht 63.0 in | Wt 140.0 lb

## 2024-07-01 DIAGNOSIS — Z0000 Encounter for general adult medical examination without abnormal findings: Secondary | ICD-10-CM

## 2024-07-01 DIAGNOSIS — G4733 Obstructive sleep apnea (adult) (pediatric): Secondary | ICD-10-CM

## 2024-07-01 DIAGNOSIS — M85851 Other specified disorders of bone density and structure, right thigh: Secondary | ICD-10-CM | POA: Diagnosis not present

## 2024-07-01 DIAGNOSIS — E559 Vitamin D deficiency, unspecified: Secondary | ICD-10-CM

## 2024-07-01 DIAGNOSIS — Z7985 Long-term (current) use of injectable non-insulin antidiabetic drugs: Secondary | ICD-10-CM | POA: Diagnosis not present

## 2024-07-01 DIAGNOSIS — N951 Menopausal and female climacteric states: Secondary | ICD-10-CM | POA: Diagnosis not present

## 2024-07-01 DIAGNOSIS — F17210 Nicotine dependence, cigarettes, uncomplicated: Secondary | ICD-10-CM

## 2024-07-01 DIAGNOSIS — K580 Irritable bowel syndrome with diarrhea: Secondary | ICD-10-CM

## 2024-07-01 DIAGNOSIS — E785 Hyperlipidemia, unspecified: Secondary | ICD-10-CM

## 2024-07-01 DIAGNOSIS — E1169 Type 2 diabetes mellitus with other specified complication: Secondary | ICD-10-CM

## 2024-07-01 LAB — COMPREHENSIVE METABOLIC PANEL WITH GFR
ALT: 20 U/L (ref 0–35)
AST: 21 U/L (ref 0–37)
Albumin: 4.4 g/dL (ref 3.5–5.2)
Alkaline Phosphatase: 50 U/L (ref 39–117)
BUN: 16 mg/dL (ref 6–23)
CO2: 30 meq/L (ref 19–32)
Calcium: 9.4 mg/dL (ref 8.4–10.5)
Chloride: 105 meq/L (ref 96–112)
Creatinine, Ser: 0.71 mg/dL (ref 0.40–1.20)
GFR: 86.76 mL/min (ref >60.00)
Glucose, Bld: 91 mg/dL (ref 70–99)
Potassium: 4.1 meq/L (ref 3.5–5.1)
Sodium: 140 meq/L (ref 135–145)
Total Bilirubin: 0.7 mg/dL (ref 0.2–1.2)
Total Protein: 7.2 g/dL (ref 6.0–8.3)

## 2024-07-01 LAB — LIPID PANEL
Cholesterol: 140 mg/dL (ref 0–200)
HDL: 42.5 mg/dL (ref >39.00)
LDL Cholesterol: 72 mg/dL (ref 0–99)
NonHDL: 97.48
Total CHOL/HDL Ratio: 3
Triglycerides: 128 mg/dL (ref 0.0–149.0)
VLDL: 25.6 mg/dL (ref 0.0–40.0)

## 2024-07-01 LAB — CBC
HCT: 42.6 % (ref 36.0–46.0)
Hemoglobin: 14.3 g/dL (ref 12.0–15.0)
MCHC: 33.6 g/dL (ref 30.0–36.0)
MCV: 95 fl (ref 78.0–100.0)
Platelets: 254 K/uL (ref 150.0–400.0)
RBC: 4.48 Mil/uL (ref 3.87–5.11)
RDW: 12.5 % (ref 11.5–15.5)
WBC: 5.6 K/uL (ref 4.0–10.5)

## 2024-07-01 LAB — TSH: TSH: 1.39 u[IU]/mL (ref 0.35–5.50)

## 2024-07-01 LAB — VITAMIN D 25 HYDROXY (VIT D DEFICIENCY, FRACTURES): VITD: 17.93 ng/mL — ABNORMAL LOW (ref 30.00–100.00)

## 2024-07-01 LAB — HEMOGLOBIN A1C: Hgb A1c MFr Bld: 5.8 % (ref 4.6–6.5)

## 2024-07-01 MED ORDER — VIBERZI 100 MG PO TABS: 1.0000 | ORAL_TABLET | Freq: Two times a day (BID) | ORAL | 5 refills | Status: AC

## 2024-07-01 MED ORDER — MOUNJARO 10 MG/0.5ML ~~LOC~~ SOAJ: 10.0000 mg | SUBCUTANEOUS | 1 refills | Status: AC

## 2024-07-01 NOTE — Assessment & Plan Note (Signed)
 Chronic Controlled, Stable Continue Viberzi 100 mg twice daily-may try taking this once a day since the [facility] is counteracting her diarrhea

## 2024-07-01 NOTE — Assessment & Plan Note (Signed)
 Chronic Controlled, Stable Continue Lexapro 10 mg daily

## 2024-07-01 NOTE — Telephone Encounter (Signed)
 Pharmacy Patient Advocate Encounter   Received notification from Patient Pharmacy that prior authorization for Mounjaro   10mg /0.16ml is required/requested.   Insurance verification completed.   The patient is insured through [facility] [employer].   Per test claim: Refill too soon. PA is not needed at this time. Medication was filled 06/07/24. Next eligible fill date is 08/09/24.     Key: [id number]

## 2024-07-01 NOTE — Assessment & Plan Note (Signed)
Chronic Check lipid panel, CMP Continue simvastatin 20 mg daily Regular exercise and healthy diet encouraged

## 2024-07-01 NOTE — Assessment & Plan Note (Signed)
 Chronic Associated with hyperlipidemia  Lab Results  Component Value Date   HGBA1C 5.6 12/16/2023   Sugars well controlled Check A1c Continue Mounjaro  10 mg weekly Feels she is getting her protein in-stressed protein and exercise Stressed regular exercise, diabetic diet

## 2024-07-01 NOTE — Assessment & Plan Note (Addendum)
 Chronic DEXA up-to-date-due later this year-Will order at next visit Encouraged regular exercise Advised calcium and vitamin D  daily Check vitamin D  level

## 2024-07-01 NOTE — Assessment & Plan Note (Signed)
 Chronic ?Using CPAP nightly ?

## 2024-07-01 NOTE — Assessment & Plan Note (Signed)
 Chronic Smokes 4-5 cigarettes a day At this point is not ready to quit Encouraged smoking cessation

## 2024-07-01 NOTE — Assessment & Plan Note (Signed)
 Chronic Taking vitamin D daily Check vitamin D level

## 2024-07-02 ENCOUNTER — Ambulatory Visit: Payer: Self-pay | Admitting: Internal Medicine

## 2024-09-13 DIAGNOSIS — G4733 Obstructive sleep apnea (adult) (pediatric): Secondary | ICD-10-CM | POA: Diagnosis not present

## 2024-09-13 DIAGNOSIS — F17210 Nicotine dependence, cigarettes, uncomplicated: Secondary | ICD-10-CM | POA: Diagnosis not present

## 2024-09-14 DIAGNOSIS — E119 Type 2 diabetes mellitus without complications: Secondary | ICD-10-CM | POA: Diagnosis not present

## 2024-09-14 DIAGNOSIS — H524 Presbyopia: Secondary | ICD-10-CM | POA: Diagnosis not present

## 2024-09-14 DIAGNOSIS — H2513 Age-related nuclear cataract, bilateral: Secondary | ICD-10-CM | POA: Diagnosis not present

## 2024-09-14 LAB — OPHTHALMOLOGY REPORT-SCANNED

## 2024-09-21 DIAGNOSIS — F17210 Nicotine dependence, cigarettes, uncomplicated: Secondary | ICD-10-CM | POA: Diagnosis not present

## 2024-09-22 ENCOUNTER — Inpatient Hospital Stay
Admission: RE | Admit: 2024-09-22 | Discharge: 2024-09-22 | Attending: Hematology and Oncology | Admitting: Hematology and Oncology

## 2024-09-22 ENCOUNTER — Encounter

## 2024-09-22 DIAGNOSIS — Z9189 Other specified personal risk factors, not elsewhere classified: Secondary | ICD-10-CM

## 2024-09-22 MED ORDER — IOPAMIDOL (ISOVUE-370) INJECTION 76%
100.0000 mL | Freq: Once | INTRAVENOUS | Status: AC | PRN
  Administered 2024-09-22: 12:00:00 100 mL via INTRAVENOUS

## 2024-11-02 ENCOUNTER — Other Ambulatory Visit (HOSPITAL_COMMUNITY): Payer: Self-pay

## 2024-11-02 ENCOUNTER — Telehealth: Payer: Self-pay

## 2024-11-03 ENCOUNTER — Other Ambulatory Visit (HOSPITAL_COMMUNITY): Payer: Self-pay

## 2024-11-03 NOTE — Telephone Encounter (Signed)
 Pharmacy Patient Advocate Encounter  Received notification from [facility] that Prior Authorization for Viberzi  100mg  has been APPROVED from 11/03/2024 to 10/05/2025. Ran test claim, Copay is $809.00. This test claim was processed through [facility]- copay amounts may vary at other pharmacies due to pharmacy/plan contracts, or as the patient moves through the different stages of their insurance plan.   PA #/Case ID/Reference #: [id number]  Patient has a $500.00 deductible

## 2024-12-29 ENCOUNTER — Ambulatory Visit: Admitting: Internal Medicine

## 2025-07-06 ENCOUNTER — Ambulatory Visit
